# Patient Record
Sex: Female | Born: 1955 | Race: White | Hispanic: No | Marital: Married | Smoking: Never smoker
Health system: Southern US, Community
[De-identification: ages and names within clinical notes are randomized; demographics above are authoritative.]

## PROBLEM LIST (undated history)

## (undated) DIAGNOSIS — K279 Peptic ulcer, site unspecified, unspecified as acute or chronic, without hemorrhage or perforation: Secondary | ICD-10-CM

## (undated) DIAGNOSIS — I10 Essential (primary) hypertension: Secondary | ICD-10-CM

## (undated) DIAGNOSIS — I498 Other specified cardiac arrhythmias: Secondary | ICD-10-CM

## (undated) DIAGNOSIS — G90A Postural orthostatic tachycardia syndrome (POTS): Secondary | ICD-10-CM

## (undated) DIAGNOSIS — Z8719 Personal history of other diseases of the digestive system: Secondary | ICD-10-CM

## (undated) DIAGNOSIS — L409 Psoriasis, unspecified: Secondary | ICD-10-CM

## (undated) DIAGNOSIS — H3530 Unspecified macular degeneration: Secondary | ICD-10-CM

## (undated) DIAGNOSIS — K7460 Unspecified cirrhosis of liver: Secondary | ICD-10-CM

## (undated) DIAGNOSIS — R002 Palpitations: Secondary | ICD-10-CM

## (undated) DIAGNOSIS — I951 Orthostatic hypotension: Secondary | ICD-10-CM

## (undated) DIAGNOSIS — M069 Rheumatoid arthritis, unspecified: Secondary | ICD-10-CM

## (undated) DIAGNOSIS — D509 Iron deficiency anemia, unspecified: Secondary | ICD-10-CM

## (undated) DIAGNOSIS — E119 Type 2 diabetes mellitus without complications: Secondary | ICD-10-CM

## (undated) DIAGNOSIS — J45909 Unspecified asthma, uncomplicated: Secondary | ICD-10-CM

## (undated) DIAGNOSIS — E039 Hypothyroidism, unspecified: Secondary | ICD-10-CM

## (undated) DIAGNOSIS — R000 Tachycardia, unspecified: Secondary | ICD-10-CM

## (undated) DIAGNOSIS — K219 Gastro-esophageal reflux disease without esophagitis: Secondary | ICD-10-CM

## (undated) HISTORY — DX: Iron deficiency anemia, unspecified: D50.9

## (undated) HISTORY — DX: Unspecified asthma, uncomplicated: J45.909

## (undated) HISTORY — DX: Tachycardia, unspecified: R00.0

## (undated) HISTORY — PX: CHOLECYSTECTOMY: SHX55

## (undated) HISTORY — DX: Peptic ulcer, site unspecified, unspecified as acute or chronic, without hemorrhage or perforation: K27.9

## (undated) HISTORY — DX: Type 2 diabetes mellitus without complications: E11.9

## (undated) HISTORY — DX: Palpitations: R00.2

## (undated) HISTORY — PX: ABDOMINAL HYSTERECTOMY: SHX81

## (undated) HISTORY — PX: KNEE ARTHROSCOPY: SUR90

## (undated) HISTORY — PX: ABDOMINAL HERNIA REPAIR: SHX539

## (undated) HISTORY — PX: TRIGGER FINGER RELEASE: SHX641

## (undated) HISTORY — DX: Essential (primary) hypertension: I10

## (undated) HISTORY — DX: Personal history of other diseases of the digestive system: Z87.19

## (undated) HISTORY — DX: Rheumatoid arthritis, unspecified: M06.9

## (undated) HISTORY — DX: Hypothyroidism, unspecified: E03.9

## (undated) HISTORY — DX: Orthostatic hypotension: I95.1

## (undated) HISTORY — DX: Other specified cardiac arrhythmias: I49.8

## (undated) HISTORY — DX: Postural orthostatic tachycardia syndrome (POTS): G90.A

## (undated) HISTORY — PX: CATARACT EXTRACTION, BILATERAL: SHX1313

## (undated) HISTORY — DX: Psoriasis, unspecified: L40.9

---
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
## 2000-10-23 ENCOUNTER — Encounter: Admission: RE | Admit: 2000-10-23 | Discharge: 2000-10-23 | Payer: Self-pay | Admitting: Internal Medicine

## 2000-10-23 ENCOUNTER — Encounter: Payer: Self-pay | Admitting: Internal Medicine

## 2002-11-19 ENCOUNTER — Encounter: Payer: Self-pay | Admitting: Urology

## 2002-11-19 ENCOUNTER — Ambulatory Visit (HOSPITAL_COMMUNITY): Admission: RE | Admit: 2002-11-19 | Discharge: 2002-11-19 | Payer: Self-pay | Admitting: *Deleted

## 2003-07-26 HISTORY — PX: TILT TABLE STUDY: SHX6124

## 2004-05-04 ENCOUNTER — Ambulatory Visit (HOSPITAL_COMMUNITY): Admission: RE | Admit: 2004-05-04 | Discharge: 2004-05-04 | Payer: Self-pay | Admitting: Internal Medicine

## 2004-05-26 ENCOUNTER — Ambulatory Visit: Payer: Self-pay | Admitting: Cardiology

## 2004-11-16 ENCOUNTER — Ambulatory Visit: Payer: Self-pay | Admitting: Psychiatry

## 2006-04-13 ENCOUNTER — Ambulatory Visit (HOSPITAL_COMMUNITY): Payer: Self-pay | Admitting: Psychiatry

## 2006-04-14 ENCOUNTER — Ambulatory Visit (HOSPITAL_COMMUNITY): Payer: Self-pay | Admitting: Psychology

## 2006-04-20 ENCOUNTER — Ambulatory Visit (HOSPITAL_COMMUNITY): Payer: Self-pay | Admitting: Psychiatry

## 2006-05-04 ENCOUNTER — Ambulatory Visit (HOSPITAL_COMMUNITY): Payer: Self-pay | Admitting: Psychiatry

## 2006-07-04 ENCOUNTER — Ambulatory Visit (HOSPITAL_COMMUNITY): Payer: Self-pay | Admitting: Psychiatry

## 2006-07-06 ENCOUNTER — Ambulatory Visit (HOSPITAL_COMMUNITY): Payer: Self-pay | Admitting: Psychology

## 2006-08-01 ENCOUNTER — Ambulatory Visit (HOSPITAL_COMMUNITY): Payer: Self-pay | Admitting: Psychiatry

## 2006-08-07 ENCOUNTER — Ambulatory Visit (HOSPITAL_COMMUNITY): Payer: Self-pay | Admitting: Psychology

## 2006-10-03 ENCOUNTER — Ambulatory Visit (HOSPITAL_COMMUNITY): Payer: Self-pay | Admitting: Psychiatry

## 2006-12-07 ENCOUNTER — Ambulatory Visit (HOSPITAL_COMMUNITY): Payer: Self-pay | Admitting: Psychiatry

## 2006-12-20 ENCOUNTER — Ambulatory Visit (HOSPITAL_COMMUNITY): Payer: Self-pay | Admitting: Psychology

## 2007-02-16 ENCOUNTER — Ambulatory Visit (HOSPITAL_COMMUNITY): Payer: Self-pay | Admitting: Psychology

## 2007-02-22 ENCOUNTER — Ambulatory Visit (HOSPITAL_COMMUNITY): Payer: Self-pay | Admitting: Psychiatry

## 2007-03-20 ENCOUNTER — Ambulatory Visit (HOSPITAL_COMMUNITY): Payer: Self-pay | Admitting: Psychology

## 2007-04-19 ENCOUNTER — Ambulatory Visit (HOSPITAL_COMMUNITY): Payer: Self-pay | Admitting: Psychology

## 2007-04-26 ENCOUNTER — Ambulatory Visit (HOSPITAL_COMMUNITY): Payer: Self-pay | Admitting: Psychiatry

## 2007-12-12 ENCOUNTER — Ambulatory Visit (HOSPITAL_COMMUNITY): Admission: RE | Admit: 2007-12-12 | Discharge: 2007-12-12 | Payer: Self-pay | Admitting: Urology

## 2007-12-12 IMAGING — CT CT ABDOMEN W/O CM
1 of 2 series · 15 of 32 positions shown, 19 images · non-contrast
Comparison: None

CT ABDOMEN

CLINICAL DATA: Left flank pain and hematuria, history kidney
stones

CT ABDOMEN AND PELVIS WITHOUT CONTRAST
TECHNIQUE: Multidetector CT imaging of the abdomen and pelvis was
performed following the standard
protocol without intravenous contrast. Sagittal and coronal images
reconstructed from axial data set.

[Series 2: stone 5.0 b40f · axial · 0.80mm/px · z∈[-518,-58]mm · 15 of 100 slices shown, 19 images]
[im 4/100  soft-tissue]
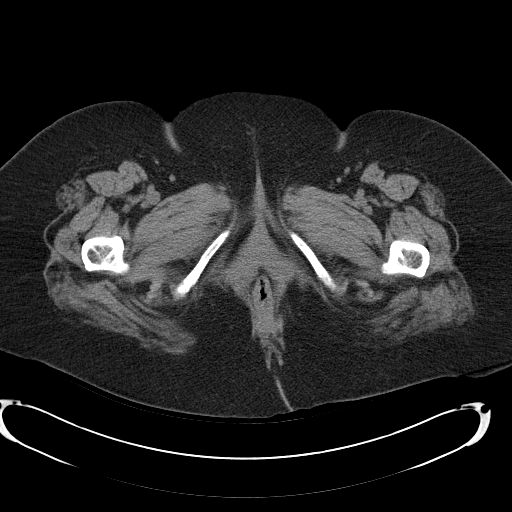
[im 4/100  bone]
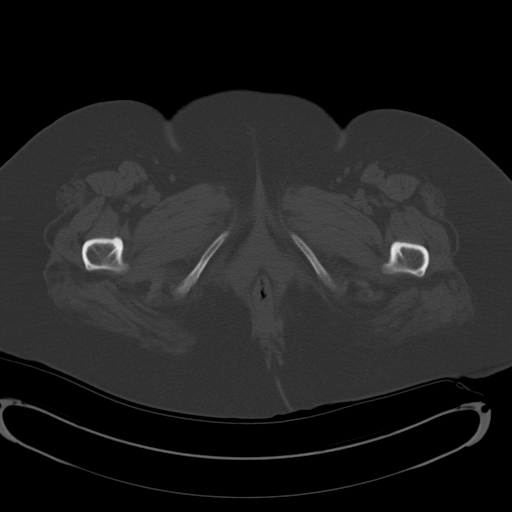
[im 12/100  soft-tissue]
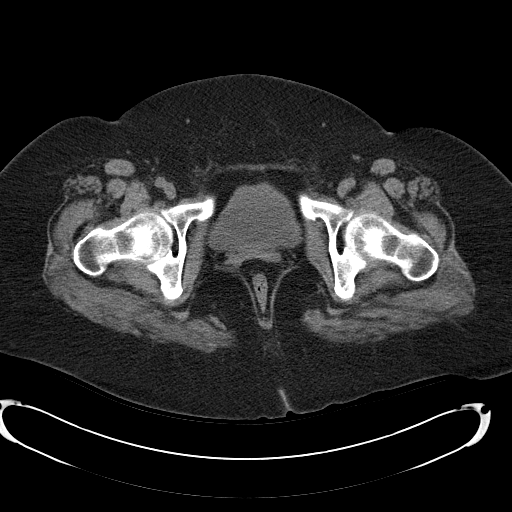
[im 20/100  soft-tissue]
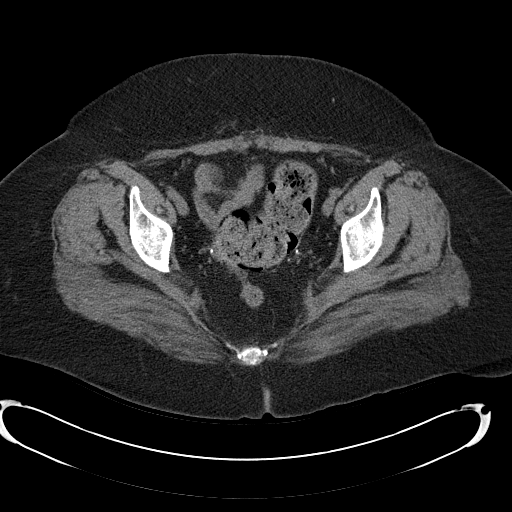
[im 28/100  soft-tissue]
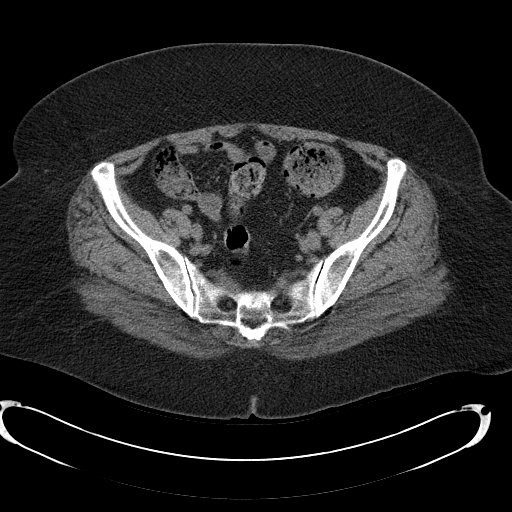
[im 36/100  soft-tissue]
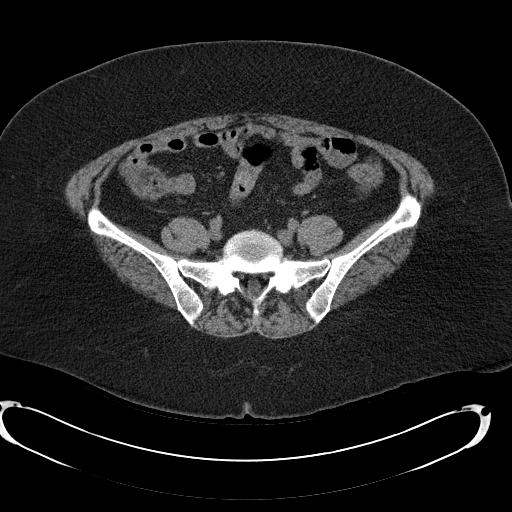
[im 44/100  soft-tissue]
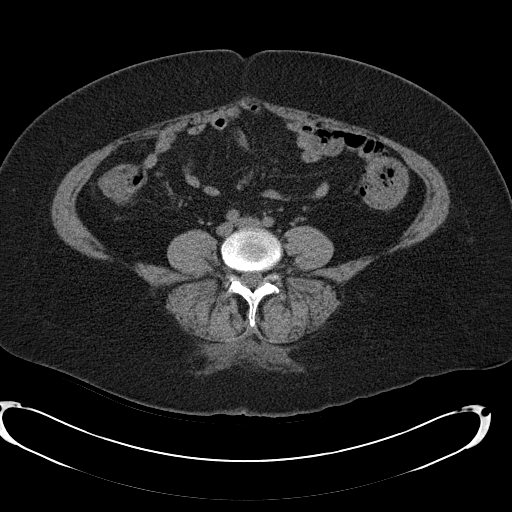
[im 52/100  soft-tissue]
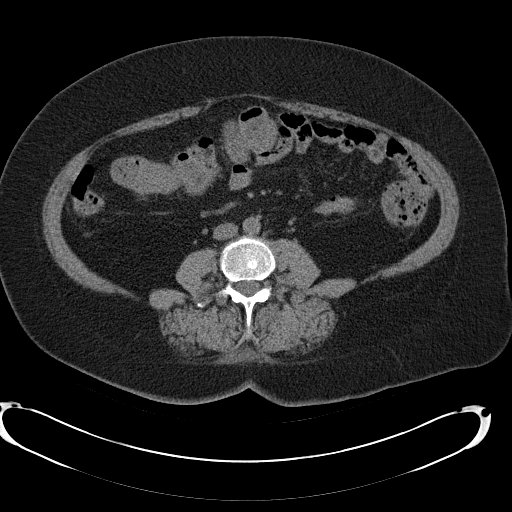
[im 56/100  soft-tissue]
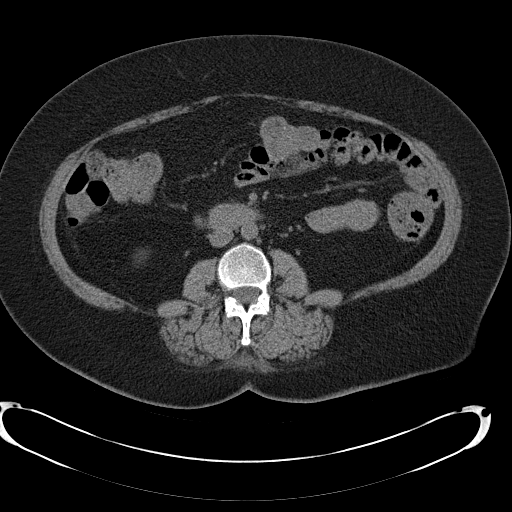
[im 64/100  soft-tissue]
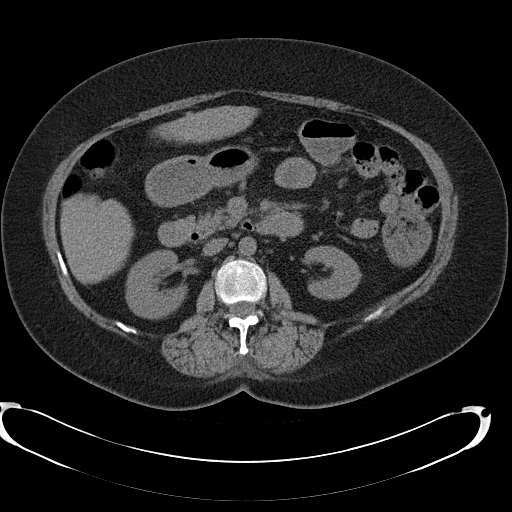
[im 64/100  bone]
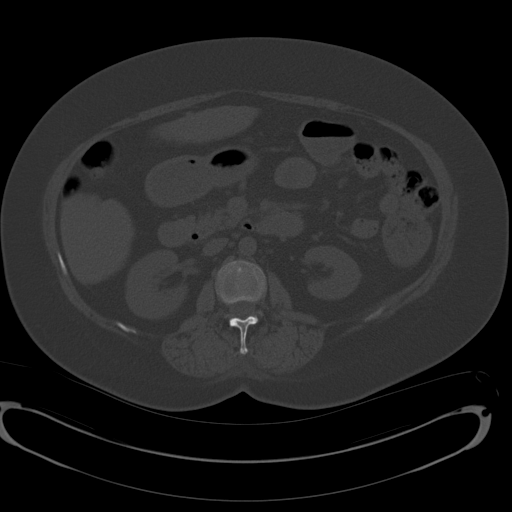
[im 72/100  soft-tissue]
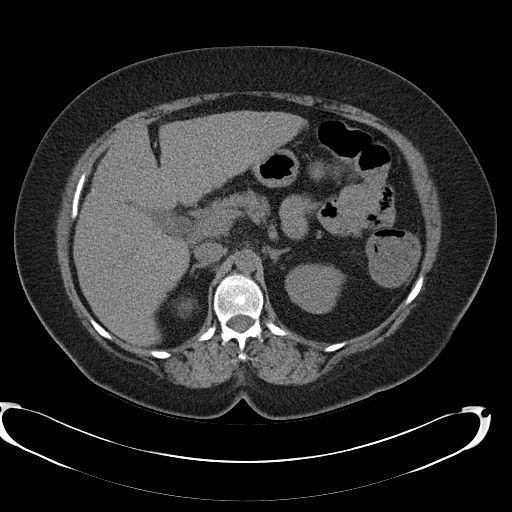
[im 80/100  soft-tissue]
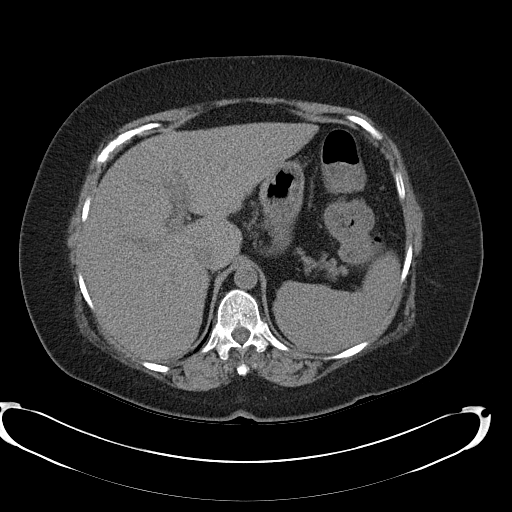
[im 84/100  lung]
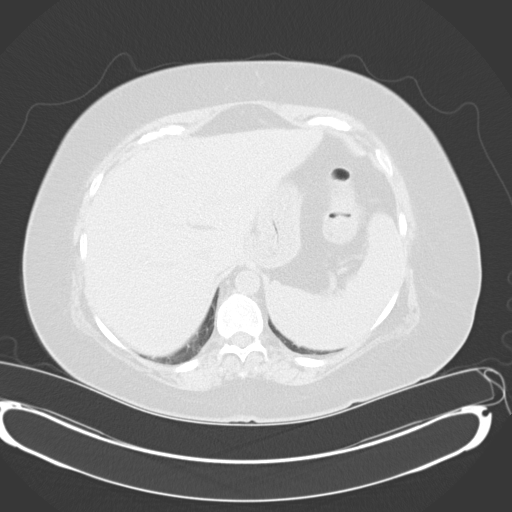
[im 88/100  soft-tissue]
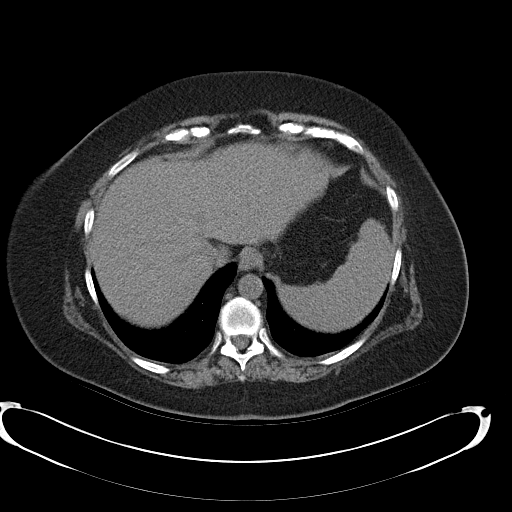
[im 88/100  lung]
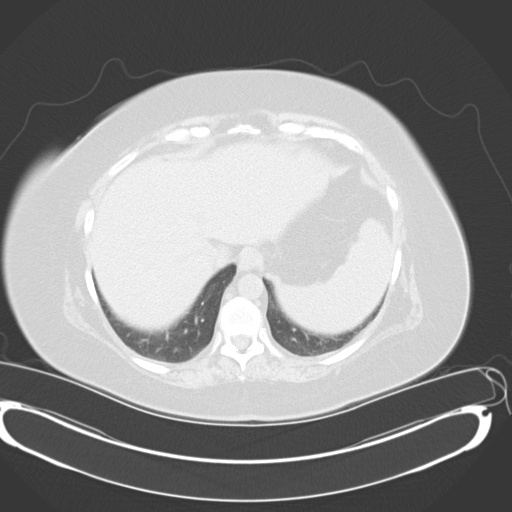
[im 92/100  lung]
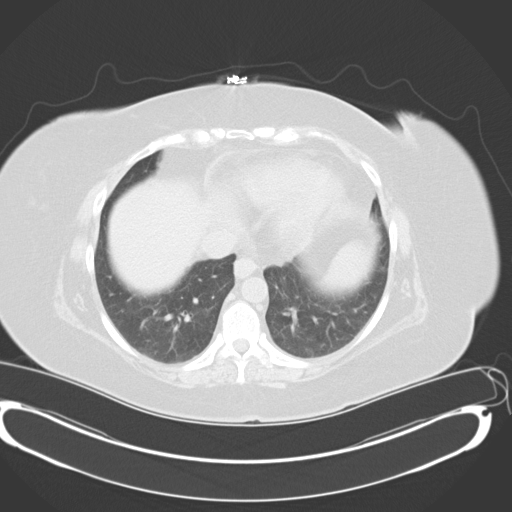
[im 96/100  soft-tissue]
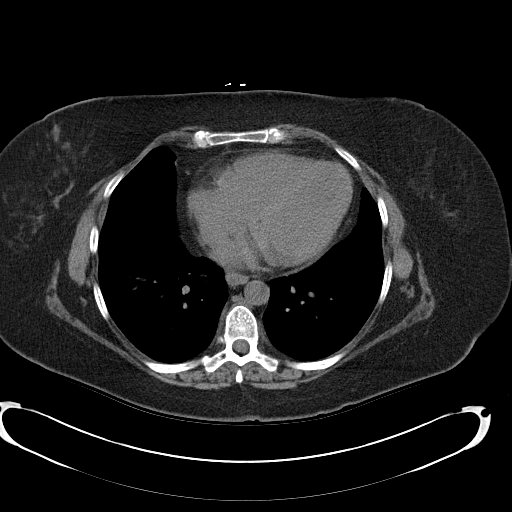
[im 96/100  lung]
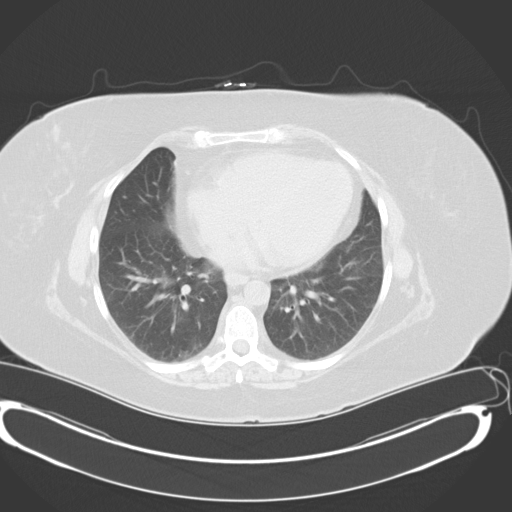

[15 of 32 positions shown; findings below may reference images not displayed]

FINDINGS: Lung bases clear.
No urinary tract calcification or dilatation.
11 mm diameter low attenuation focus left lobe liver, too small to
characterize, image 24.
Low attenuation focus medial right mid kidney, 16 x 15 mm image 35.
Remaining solid organs and bowel loops in upper abdomen normal for
a noncontrast study.
No mass, adenopathy, or free fluid.
IMPRESSION: Question tiny hepatic and right renal cysts, recommend sonographic
follow-up to exclude solid lesions.
No other upper abdominal abnormalities.

CT PELVIS
FINDINGS: Large and small bowel loops in pelvis normal.
Bladder decompressed.
Distal ureters unremarkable.
Status post hysterectomy.
No pelvic mass, adenopathy, free fluid, or inflammatory process.
Mildly prominent stool throughout colon.
No acute bony abnormalities.
IMPRESSION: No acute pelvic abnormalities.

## 2008-03-18 ENCOUNTER — Ambulatory Visit: Payer: Self-pay | Admitting: Cardiology

## 2008-03-24 ENCOUNTER — Ambulatory Visit: Payer: Self-pay | Admitting: Cardiology

## 2008-04-02 ENCOUNTER — Ambulatory Visit: Payer: Self-pay | Admitting: Cardiology

## 2008-04-15 ENCOUNTER — Ambulatory Visit: Payer: Self-pay | Admitting: Cardiology

## 2009-08-28 ENCOUNTER — Ambulatory Visit (HOSPITAL_COMMUNITY): Payer: Self-pay | Admitting: Psychology

## 2009-11-12 ENCOUNTER — Ambulatory Visit (HOSPITAL_COMMUNITY): Payer: Self-pay | Admitting: Psychology

## 2010-12-07 NOTE — Assessment & Plan Note (Signed)
[facility]                          [facility] OFFICE NOTE   NAME:Mewborn, HUDSYN BARICH                        MRN:          [id number]  DATE:03/18/2008                            DOB:          [birth year]    PRIMARY CARE PHYSICIAN:  Doreen Beam, MD   REASON FOR VISIT:  Scheduled followup.   HISTORY OF PRESENT ILLNESS:  Ms. Weill has not been seen in the office  since 2005.  She has a history of fluctuating blood pressures and  possibly postural orthostatic tachycardia with palpitations.  She did  have a tilt table test under the direction of Dr. Graciela Husbands back in 2005,  which did confirm some degree of orthostatic intolerance manifested by  postural tachycardia.  Toprol-XL was prescribed and has been continued  since that time.  She is back in the office stating that she has noticed  some fluctuations in her blood pressures when she checks them at home  and has also had some recent palpitations.  Regarding her blood  pressure, she actually self adjusts her Toprol-XL alternating between  100 mg and 150 mg depending on her blood pressure and heart rate  response.  Based on her description, it seems that she is on the 100 mg  dose most regularly.  She feels like the 150 mg dose tends to lower her  blood pressure too much.  Her description of palpitations is that she  experiences a fairly sudden onset palpitation with a feeling of vertigo  and sometimes chest discomfort, only in the evening when she is supine,  and associated with a feeling of lightheadedness.  This also seems to  abate fairly quickly.  She reports blood work per Dr. Sherril Croon back in July,  which I was able to review demonstrating progressive anemia with a  hemoglobin of 8.4, MCV of 73, abnormal TSH of 15.9, normal chemistries  today with the exception of glucose 112, and an LDL of 145.  She is on  levothyroxine and has been placed on iron supplementation as well.  She  actually feels as if her  palpitations have improved from every other  day, occurrence to perhaps once in a 2-week period of time.  Otherwise  as far as exertion, she does have dyspnea on exertion, which has been  somewhat more noticeable.  Her last ultrasound in 2003 demonstrated  normal ventricle systolic function with a small pericardial effusion,  trace tricuspid regurgitation, and no significant mitral regurgitation.   ALLERGIES:  AMOXICILLIN, AMPICILLIN, and NONSTEROIDAL ANTIINFLAMMATORY  drugs.   PRESENT MEDICATIONS:  1. Toprol-XL 150 mg to 100 mg, adjusted by the patient on a daily      basis depending on blood pressure.  2. Slow iron daily.  3. Levothyroxine 75 mcg daily.  4. Zanaflex 4 mg p.o. b.i.d. to t.i.d.  5. Zyrtec 10 mg p.o. daily p.r.n.  6. Ventolin p.r.n.   REVIEW OF SYSTEMS:  As described in history of present illness.  She has  had no syncope.  No reproducible exertional chest pain.  No orthopnea or  PND.  She reports  that she drinks at least 8 glasses of water a day,  also urinates frequently.  No fevers or chills, cough, hemoptysis,  otherwise negative.   PHYSICAL EXAMINATION:  VITAL SIGNS:  Blood pressure is 160/94, heart  rate is 99, settles into the high 80s after being seated for several  minutes, weight 118.6 pounds.  She is an overweight woman seated in no  acute distress.  HEENT:  Conjunctiva is normal.  Oropharynx is clear.  NECK:  Supple.  No elevated jugular venous pressure.  No loud bruits or  no thyromegaly is noted.  No thyroid tenderness.  LUNGS:  Clear without labored breathing.  CARDIAC:  Reveals regular rate and rhythm.  No obvious pathologic  murmur.  There is a soft systolic murmur at the base.  No S3, gallop, or  pericardial rub.  ABDOMEN:  Obese.  No tenderness noted.  Bowel sounds present.  EXTREMITIES:  Exhibit no frank pitting edema.  Distal pulses are 1+.  SKIN:  Warm and dry.  MUSCULOSKELETAL:  No kyphosis noted.  NEUROPSYCHIATRIC:  The patient is alert  and oriented x3.  Affect is  appropriate.   A 12-lead electrocardiogram showed sinus rhythm at 99 beats per minute  with nonspecific ST-T wave changes.  No marked change in comparison to  the previous tracing from 2003 with the exception of increase in heart  rate from 73-99.   IMPRESSION AND RECOMMENDATIONS:  1. History of fluctuating blood pressures with previously documented      mild orthostasis and postural orthostatic tachycardia.  She has      been on long-term beta-blockers since evaluation by Dr. Graciela Husbands in      the past.  She seems to be indicating problems tolerating her      Toprol-XL 150 mg as it results in relatively low blood pressures      and a feeling of fatigue.  I have asked her to decrease this back      to 100 mg daily and check her blood pressure over the next 2 weeks      with a followup with nursing.  Based on her trend in heart rate and      blood pressure, we can determine the next step and medical therapy.      It might be that we leave her on beta-blocker therapy at the      present dose and that something else for blood pressure control is      needed.  She also understands to maintain a reasonably well-      hydrated state.  We will plan a followup echocardiogram as well to      ensure continued to reassess left ventricular systolic function and      exclude any valvular problems.  2. Regarding her palpitations, these seemed to be improving.  She has      never had any clearly documented supraventricular dysrhythmias.      She has worn a cardiac monitor in the past.  If the symptoms      progress or worsen, we can certainly reconsider followup monitor.      She was comfortable with observation for the time being.  3. Chronic anemia based on records and recent labs.  She has a      microcytic anemia.  She      tells me that she has had normal stool cards and a previous      Gastroenterology evaluation.  Dr. Sherril Croon has been following this  most       recently.     Jonelle Sidle, MD  Electronically Signed    SGM/MedQ  DD: 03/18/2008  DT: 03/19/2008  Job #: [id number]   cc:   Doreen Beam, MD

## 2010-12-07 NOTE — Assessment & Plan Note (Signed)
[facility]                          [facility] OFFICE NOTE   NAME:Kristine Rocha, Kristine Rocha                        MRN:          [id number]  DATE:04/15/2008                            DOB:          09/13/1955    CARDIOLOGIST:  Jonelle Sidle, MD.   PRIMARY CARE PHYSICIAN:  Doreen Beam, MD.   REASON FOR VISIT:  A 11-month followup.   HISTORY OF PRESENT ILLNESS:  Kristine Rocha is a 55 year old female patient  with a history of fluctuating blood pressures and potential postural  orthostatic tachycardia with palpitations, who has previously undergone  evaluation by Dr. Graciela Husbands with tilt table testing in 2005.  She saw Dr.  Diona Browner for the first time in 4 years on March 18, 2008, in followup.  She had problems with blood pressure fluctuations and palpitations.  She  recently was diagnosed with hypothyroidism and was placed on thyroid  replacement.  She was also noted to have a hemoglobin of 8.4 and MCV of  73 and, in looking through her records, it appears as though she has  chronic microcytic anemia.  She denies any history of GI bleeding.  She  has been worked up by Gastroenterology in the past.  Dr. Diona Browner asked  her to change her Toprol from 150 mg to 100 mg daily and to keep track  of her blood pressures.  She brought a list of her blood pressures in  recently.  She averaged a blood pressure of less than 140/90.  However,  she was asked to start on low-dose amlodipine at 2.5 mg daily.  She took  2 dosages of this and noted significant side effects.  She felt near-  syncopal.  She actually states she felt like she was going to die.  She stopped the medication and her symptoms resolved.  She has continued  to note some palpitations from time to time.  They are much improved  since she started on thyroid replacement.  She also had a followup  hemoglobin after starting back on iron therapy.  This was apparently in  the 11 range recently.  She denies  significant shortness of breath or  chest pain.  She denies any syncope.   CURRENT MEDICATIONS:  Slow iron, Levothyroxine 0.275 mg daily, Zanaflex  4 mg b.i.d. to t.i.d., Zyrtec, Ventolin, Toprol-XL 100 mg daily.   PHYSICAL EXAMINATION:  GENERAL:  She is a well-nourished and well-  developed female, in no distress.  VITAL SIGNS:  Blood pressure is 130/74, pulse 64, and weight 227 pounds.  HEENT:  Normal.  NECK:  Without JVD.  CARDIAC:  S1 and S2.  Regular rate and rhythm.  LUNGS:  Clear to auscultation bilaterally.  ABDOMEN:  Soft and nontender.  EXTREMITIES:  With trace edema bilaterally.  VASCULAR:  Without carotid bruits bilaterally.   Echocardiogram performed on March 24, 2008, revealed an EF of 60-65%,  trace MR, and mild TR.   ASSESSMENT AND PLAN:  1. Hypertension with a history of orthostatic intolerance in the past      with associated tachycardia.  The patient has actually  been      maintaining acceptable blood pressures over the last several weeks      while on Toprol 100 mg daily.  She was unable to tolerate Norvasc      and I think, she probably became significantly hypotensive with      this medication.  Again, in looking through her blood pressures,      she averaged less than 140/90.  Her blood pressure today is      acceptable.  Given her history of postural orthostatic intolerance,      I would not suggest placing her on a diuretic.  At this point, I do      not think she needs further blood pressure medications.  We will      continue to observe her blood pressures over time and make      adjustments in the future as necessary.  2. Palpitations.  The patient has had palpitations in the past without      documented supraventricular arrhythmias.  She apparently had a      monitor placed some years ago without any significant findings.      She has noted palpitations recently over the last several months.      They are different from what she has had in the past.   She is a      retired Engineer, civil (consulting).  She used to work at [employer].  She did document      premature atrial contractions at one point, but her symptoms      currently do not remind her for premature atrial contractions.      Also, she does not think her symptoms remind her of her previous      tachycardia.  We had a long discussion today regarding possibly      proceeding with event monitoring.  She would like to hold off on      this for now.  She feels that her symptoms have improved      significantly since she was placed on thyroid replacement and her      hemoglobin has improved.  I think this is a reasonable approach and      we will bring her back in 6 months for review of her symptoms.  We      will make a decision at that time regarding whether or not she      should have an event monitor placed.  She knows to come back sooner      if she has further changes in her symptoms.  3. Microcytic anemia.  This seems to be a chronic problem and she has      seen Gastroenterology in the past.  She will continue to follow up      with Dr. Sherril Croon.  4. Hypothyroidism.  This is a new diagnosis and she is on thyroid      replacement.  She notes that she had recent blood work that      revealed a TSH of 4.68.  She will continue follow up with Dr. Sherril Croon.   DISPOSITION:  The patient will be brought back in followup in the next 6  months with Dr. Diona Browner or sooner p.r.n.     Tereso Newcomer, PA-C  Electronically Signed      Jonelle Sidle, MD  Electronically Signed   SW/MedQ  DD: 04/15/2008  DT: 04/16/2008  Job #: [id number]   cc:   Doreen Beam, MD

## 2010-12-10 NOTE — H&P (Signed)
NAMESANDREA, BOER                 ACCOUNT NO.:  [id number]   MEDICAL RECORD NO.:  [id number]          PATIENT TYPE:  OIB   LOCATION:  2899                         FACILITY:  [facility]   PHYSICIAN:  Duke Salvia, M.D.  DATE OF BIRTH:  30-Dec-1955   DATE OF ADMISSION:  05/04/2004  DATE OF DISCHARGE:                                HISTORY & PHYSICAL   CARDIOLOGIST:  Dr. Lewayne Bunting and Dr. Jonelle Sidle.   ELECTROPHYSIOLOGIST:  Dr. Duke Salvia.   PRIMARY CARE PHYSICIAN:  Dr. Doreen Beam.   ALLERGIES:  __________, AMPICILLIN, AMOXICILLIN, and ALL NSAIDS.   HISTORY OF PRESENT ILLNESS:  Ms. Rocha is a 55 year old female with a  complex symptomatology. Basically, there are two competing cardiac issues.  1.  Tachyarrhythmias caught on the monitor in July or August 2005. This is a      sinus tachycardia with rates and axis of 160 beats per minute. Episodes      can be brief (1.5 to 8+) or last for days. At least every other day she      has one of these episodes. The last one yesterday which lasted one-half      day. These episodes currently are better on Toprol XL 50 mg daily. She      was on Toprol 50 mg b.i.d. and was hypotensive with that. She does not      particularly get dizzy, but is somewhat short of breath and some times      has chest tightness with these tachyarrhythmias.  2.  Presyncope. This seems to happen in hot weather and with dehydration.      The patient has collapsed in the past, that was one year ago, but lately      her symptoms seem to be a roaring and pressure around her head. She      cannot see and she cannot hear. Her last spells were about 9 days ago.      She had four on that day and two the next. They last only seconds. After      the episodes she feels fatigue and chest tightness. Thromboembolic risk      factors are hypertension and a family history of myocardial infarction      in her father. It is not known her Cardiolite status or echocardiogram      status. She claims she has had an echocardiogram in the past.   MEDICATIONS:  1.  Toprol XL 50 mg daily.  2.  Zanaflex 4 mg b.i.d.  3.  Belegrags one tablet daily.  4.  Vivelle 0.1 mg per day.  5.  Transdermal patch to change twice weekly.   SOCIAL HISTORY:  The patient lives in [place] with her husband. She has two  children. One has juvenile diabetes. No tobacco, ethanol, and no drugs.   FAMILY HISTORY:  Mother is alive and well. Father is died at age 48 of a  myocardial infarction. One sister has WPW syndrome, status post ablation.  One brother is alive and well.   REVIEW OF SYSTEMS:  The patient is not complaining of fevers, chills, night  sweats, weight change, or adenopathy. HEENT: She does have frequent  epistaxis especially when the weather is dry. She does experience vertigo.  No photophobia, vision, or hearing loss, except she has vision and hearing  loss when she has these episodes as described in history of present illness.  SKIN: No nonhealing ulcerations, lesions, or rash. CARDIOPULMONARY: Chest  tightness and shortness of breath with rapid heart rate and also with these  presyncopal spells. She has orthopnea, sleeps on two pillows. No paroxysmal  nocturnal dyspnea and no edema. She has a history of presyncope as described  in the history of present illness and tachy palpitations lasting from one-  half day to several days. UROGENITAL: No specific complaints.  NEUROLOGIC/PSYCHIATRIC: She has weakness with these palpitations. She has  been treated in the past with depression with both Lexapro and Wellbutrin,  currently not on these. GI: She has a history of GI bleed with the last one  being in June 2003 with anemia. She was admitted with a hemoglobin of 8 and  treated with two units of packed red blood cells. ENDOCRINE: Thyroid status  not discussed with the patient and is unknown. ARTHRALGIAS: The patient has  back pain on a chronic basis and pain at the right second  toe which is a  gouty arthritis.   PHYSICAL EXAMINATION:  VITAL SIGNS: Pending.  GENERAL: Alert and oriented female in no acute distress.  HEENT: Normocephalic and atraumatic. Eyes--pupils equal, round, and reactive  to light. Extraocular movements intact. Sclera clear. Nares--without  discharge.  NECK: Supple with no carotid bruits auscultated. The patient does not wear  dentures. No jugular venous distention or cervical lymphadenopathy.  HEART: Regular rate and rhythm with S1 and S2. No murmurs, rubs, or gallops.  LUNGS: Clear to auscultation and percussion bilaterally.  ABDOMEN: Soft, mildly obese, nontender, bowel sounds present. No rebound or  guarding. Abdominal aorta is nonpulsatile.  EXTREMITIES: No evidence of clubbing, cyanosis, or edema. There are no  rashes or ulcerations.  MUSCULOSKELETAL: No joint deformity, effusion. No kyphosis or scoliosis.  NEUROLOGIC: Alert and oriented times three with no focal deficits noted.   There is no chest x-ray for reference. Electrocardiogram shows a sinus  tachycardia with rate of 136, PR interval is 120, QRS 76, and QTC is 509.   LABORATORY DATA:  None prior to this admission.   PROBLEM LIST:  1.  Admitted with history of presyncope with possible psychogenic component      for tilt table test.  2.  History of tachy palpitations with recording of sinus tachycardia on      Holter monitor. Toprol XL 50 mg daily has helped with the frequency and      duration of these symptoms.  3.  History of anemia secondary to GI bleed. Most recent admission in June      2005 with transfusion of two units of packed red blood cells.  4.  Hypertension.  5.  History of  peptic ulcer disease.  6.  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  7.  Status post cholecystectomy three years ago.  8.  Post menopausal on Vivelle and Zanaflex.  9.  Fatty liver on liver biopsy and also demonstrating hepatitis C. 10. Dyslipidemia, unable to take statins secondary to liver  disease.   PLAN:  Tilt table with Dr. Graciela Husbands on October 11th.       GM/MEDQ  D:  05/04/2004  T:  05/04/2004  Job:  [id number]

## 2010-12-10 NOTE — Op Note (Signed)
NAMEJOSSILYN, Kristine Rocha                 ACCOUNT NO.:  [id number]   MEDICAL RECORD NO.:  [id number]          PATIENT TYPE:  OIB   LOCATION:  2899                         FACILITY:  [facility]   PHYSICIAN:  Duke Salvia, M.D.  DATE OF BIRTH:  [birth year]   DATE OF PROCEDURE:  05/04/2004  DATE OF DISCHARGE:                                 OPERATIVE REPORT   PREOPERATIVE DIAGNOSIS:  Tachy palpitations and orthostatic lightheadedness.   POSTOPERATIVE DIAGNOSIS:  Tachy palpitations and orthostatic  lightheadedness.   OPERATION PERFORMED:  Orthostatic head up tilt table testing.   SURGEON:  Duke Salvia, M.D.   DESCRIPTION OF PROCEDURE:  Following the obtaining of informed consent, the  patient was brought to the electrophysiologic laboratory and placed on the  fluoroscopic table in the supine position.  She was equilibrated in this  position with a blood pressure approximately 120/70 with a pulse of 75 to  80.  She was tilted up to 15 degrees and there was an abrupt change in her  blood pressure down to 105 to 110 range.  This gradually corrected itself to  the 115 to 120 range by the end of the study.  Her heart rate, however,  increased from a baseline of about 70 to the high 90s to 100 to equilibrate.   IMPRESSION:  1.  Mild orthostasis.  2.  Postural orthostatic tachycardia.   [name] test confirms some degree of orthostatic intolerance and some  manifestations of postural tachycardia.  She is symptomatically much  improved on her Toprol and we will continue that.  I have, in fact, advised  her to increase her Toprol today from 50 to 100 mg a day.  We will see her  again as needed.  She will follow up with Dr. Diona Browner and Suszanne Conners. Julious Oka. in [place].       SCK/MEDQ  D:  05/04/2004  T:  05/04/2004  Job:  [id number]   cc:   Doreen Beam  [address]  [place]  [place] [address]  Fax: [phone]   Electrophysiology lab

## 2011-06-18 ENCOUNTER — Other Ambulatory Visit (HOSPITAL_COMMUNITY): Payer: Self-pay | Admitting: Psychiatry

## 2011-06-21 DIAGNOSIS — L409 Psoriasis, unspecified: Secondary | ICD-10-CM | POA: Insufficient documentation

## 2011-06-21 DIAGNOSIS — Z79899 Other long term (current) drug therapy: Secondary | ICD-10-CM | POA: Insufficient documentation

## 2012-02-07 DIAGNOSIS — L219 Seborrheic dermatitis, unspecified: Secondary | ICD-10-CM | POA: Insufficient documentation

## 2013-06-05 DIAGNOSIS — L304 Erythema intertrigo: Secondary | ICD-10-CM | POA: Insufficient documentation

## 2014-10-28 ENCOUNTER — Encounter: Payer: Self-pay | Admitting: Cardiology

## 2014-10-29 ENCOUNTER — Encounter: Payer: Self-pay | Admitting: Cardiology

## 2014-11-19 ENCOUNTER — Encounter: Payer: Self-pay | Admitting: *Deleted

## 2014-11-20 ENCOUNTER — Encounter: Payer: Self-pay | Admitting: Cardiology

## 2014-11-20 ENCOUNTER — Ambulatory Visit (INDEPENDENT_AMBULATORY_CARE_PROVIDER_SITE_OTHER): Payer: Medicare Other | Admitting: Cardiology

## 2014-11-20 ENCOUNTER — Encounter: Payer: Self-pay | Admitting: *Deleted

## 2014-11-20 VITALS — BP 102/78 | HR 73 | Ht 61.0 in | Wt 211.8 lb

## 2014-11-20 DIAGNOSIS — R0602 Shortness of breath: Secondary | ICD-10-CM

## 2014-11-20 DIAGNOSIS — R072 Precordial pain: Secondary | ICD-10-CM

## 2014-11-20 DIAGNOSIS — R011 Cardiac murmur, unspecified: Secondary | ICD-10-CM

## 2014-11-20 DIAGNOSIS — R000 Tachycardia, unspecified: Secondary | ICD-10-CM | POA: Diagnosis not present

## 2014-11-20 DIAGNOSIS — I498 Other specified cardiac arrhythmias: Secondary | ICD-10-CM

## 2014-11-20 DIAGNOSIS — I10 Essential (primary) hypertension: Secondary | ICD-10-CM

## 2014-11-20 DIAGNOSIS — I951 Orthostatic hypotension: Secondary | ICD-10-CM

## 2014-11-20 DIAGNOSIS — G90A Postural orthostatic tachycardia syndrome (POTS): Secondary | ICD-10-CM

## 2014-11-20 NOTE — Patient Instructions (Signed)
Your physician recommends that you continue on your current medications as directed. Please refer to the Current Medication list given to you today. Your physician has requested that you have an echocardiogram. Echocardiography is a painless test that uses sound waves to create images of your heart. It provides your doctor with information about the size and shape of your heart and how well your heart's chambers and valves are working. This procedure takes approximately one hour. There are no restrictions for this procedure. Your physician has requested that you have a lexiscan myoview. For further information please visit [email or web address]. Please follow instruction sheet, as given. We will call you with your results.

## 2014-11-20 NOTE — Progress Notes (Signed)
Cardiology Office Note  Date: 11/20/2014   ID: Kristine Rocha, DOB 16-Mar-1956, MRN [id number]  PCP: Glenda Chroman., MD  Primary Cardiologist: Rozann Lesches, MD   Chief Complaint  Patient presents with  . Hospitalization Follow-up  . Chest Pain    History of Present Illness: Kristine Rocha is a 59 y.o. female last seen in 2009, referred for cardiology evaluation by Dr. Woody Seller after recent hospital stay at [facility]. She was observed for evaluation following presentation with chest pain. Cardiac markers were normal arguing against ACS, ECG and CT angiography of the chest are outlined below without acute abnormalities. Review of the discharge summary indicates that there was concern about a gastrointestinal etiology.  She comes in today for evaluation, describes her symptoms as a feeling of heaviness and breathlessness that occurred at night time waking her up, had followed a situation where she was angry. The symptoms recurred and became progressive prompting her hospital stay. She has had only one subsequent event similar to this following discharge, that again occurred when she was upset. Over the last few months she has reported less stamina and increased dyspnea on exertion. She does not have a documented history of obstructive CAD although has not had any recent ischemic evaluation.  Family history includes premature cardiovascular disease. Additional health problems are noted below, she is on methotrexate with history of rheumatoid arthritis and psoriasis. Her most recent lab work is outlined below.  She functions in her ADLs although is limited by bilateral arthritic pain in her knees. 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   Past Medical History  Diagnosis Date  . Essential hypertension   . Microcytic anemia   . Palpitations   . Hypothyroidism   . Postural orthostatic tachycardia syndrome     Mildly orthostatic results tilt table test 2005  - Dr. Caryl Comes  . Peptic ulcer disease     . History of GI bleed   . Asthma   . Psoriasis   . Rheumatoid arthritis     Past Surgical History  Procedure Laterality Date  . Tilt table study  2005    [place]  . Cholecystectomy    . Abdominal hysterectomy  20 years ago   . Abdominal hernia repair  15 years ago    Current Outpatient Prescriptions  Medication Sig Dispense Refill  . aspirin 325 MG tablet Take 325 mg by mouth as needed.    . gabapentin (NEURONTIN) 400 MG capsule Take 400 mg by mouth at bedtime.    . hyoscyamine (LEVSIN, ANASPAZ) 0.125 MG tablet Take 0.125 mg by mouth as needed.    Marland Kitchen levothyroxine (SYNTHROID, LEVOTHROID) 75 MCG tablet Take 75 mcg by mouth daily before breakfast.    . methotrexate 2.5 MG tablet Takes 3 tabs (7.5mg ) by mouth every Sunday.    . metoprolol succinate (TOPROL-XL) 100 MG 24 hr tablet Take 100 mg by mouth daily. Take with or immediately following a meal.    . tiZANidine (ZANAFLEX) 4 MG capsule Take 4 mg by mouth 3 (three) times daily as needed for muscle spasms.     No current facility-administered medications for this visit.    Allergies:  Amoxicillin; Ampicillin; and Other   Social History: The patient  reports that she has never smoked. She does not have any smokeless tobacco history on file. She reports that she does not drink alcohol or use illicit drugs.   Family History: The patient's family history includes Alzheimer's disease in her mother; Arrhythmia  in her sister; Heart attack in her brother, father, and maternal grandfather; Pneumonia in her mother.   ROS:  Please see the history of present illness. Otherwise, complete review of systems is positive for improved psoriasis, mild arthralgias with the exception of more significant pain in her knees.  All other systems are reviewed and negative.   Physical Exam: VS:  BP 102/78 mmHg  Pulse 73  Ht 5\' 1"  (1.549 m)  Wt 211 lb 12.8 oz (96.072 kg)  BMI 40.04 kg/m2  SpO2 99%, BMI Body mass index is 40.04 kg/(m^2).  Wt Readings from  Last 3 Encounters:  11/20/14 211 lb 12.8 oz (96.072 kg)     General: Overweight woman, appears comfortable at rest. HEENT: Conjunctiva and lids normal, oropharynx clear. Neck: Supple, no elevated JVP or carotid bruits, no thyromegaly. Lungs: Clear to auscultation, nonlabored breathing at rest. Cardiac: Regular rate and rhythm, no S3, 3-7/6 systolic murmur, no pericardial rub. Abdomen: Soft, nontender, bowel sounds present, no guarding or rebound. Extremities: No pitting edema, distal pulses 2+. Skin: Warm and dry. Few psoriatic lesions. Musculoskeletal: No kyphosis. Neuropsychiatric: Alert and oriented x3, affect grossly appropriate.   ECG: Tracing from 10/28/2014 at [facility] showed sinus rhythm with borderline short PR interval, increased voltage, and diffuse nonspecific ST segment abnormalities.   Recent Labwork:  10/28/2014: Potassium 4.4, BUN 10, creatinine 0.9, TSH 0.48, troponin I less than 0.01, INR 1.0, hemoglobin 11.6, platelets 226  10/20/2014: C-reactive protein 16.3, hemoglobin 11.6, platelets 229, nightly 11, creatinine 1.0, potassium 5.3, AST 17, ALT 15  Other Studies Reviewed Today:  Chest CT angiogram 10/28/2014 [facility]) reported no evidence of pulmonary embolus, no aortic dissection, patchy atelectasis without edema or consolidation, atherosclerotic calcification within the right common carotid, hepatic steatosis.   Assessment and Plan:  1. Recent episodic chest pain as outlined as well as perception of decreased stamina and shortness of breath over the last few months. Records reviewed including recent documentation of normal cardiac markers, nonspecific ST segment abnormalities by ECG. She has a family history of premature cardiovascular disease, personal cardiac risk factors including hypertension and also some documentation of atherosclerosis by CT imaging. Plan will be to proceed with a Lexiscan Cardiolite for objective ischemic evaluation.  2. Cardiac murmur,  echocardiogram will be obtained to assess valvular status.  3. History of postural orthostatic tachycardia, treated with long-term beta blocker. This seems to be relatively stable.  4. Essential hypertension, blood pressure is stable.  Current medicines were reviewed with the patient today.   Orders Placed This Encounter  Procedures  . NM Myocar Multi W/Spect W/Wall Motion / EF  . Myocardial Perfusion Imaging  . Echocardiogram    Disposition: Call with results.   Signed, Satira Sark, MD, [facility] 11/20/2014 2:27 PM    [place] [facility] [address], [place], [place] [address] Phone: [phone]; Fax: [phone]

## 2014-11-28 ENCOUNTER — Encounter (HOSPITAL_COMMUNITY): Payer: Medicare Other

## 2014-11-28 ENCOUNTER — Inpatient Hospital Stay (HOSPITAL_COMMUNITY): Admission: RE | Admit: 2014-11-28 | Payer: Medicare Other | Source: Ambulatory Visit

## 2014-11-28 ENCOUNTER — Ambulatory Visit (HOSPITAL_COMMUNITY): Payer: Medicare Other

## 2014-11-28 ENCOUNTER — Other Ambulatory Visit (HOSPITAL_COMMUNITY): Payer: Medicare Other

## 2014-12-05 ENCOUNTER — Telehealth: Payer: Self-pay | Admitting: Cardiology

## 2014-12-05 NOTE — Telephone Encounter (Signed)
Noted  

## 2014-12-05 NOTE — Telephone Encounter (Signed)
Mrs. Hoganson called stating that she needs to cancel her stress testing and echo for Monday due to problems with her husband's health. She is willing to do the echo in the office but for now wants to cancel the stress test. Please advise.

## 2014-12-08 ENCOUNTER — Encounter (HOSPITAL_COMMUNITY): Payer: Medicare Other

## 2014-12-08 ENCOUNTER — Inpatient Hospital Stay (HOSPITAL_COMMUNITY): Admission: RE | Admit: 2014-12-08 | Payer: Medicare Other | Source: Ambulatory Visit

## 2014-12-08 ENCOUNTER — Other Ambulatory Visit (HOSPITAL_COMMUNITY): Payer: Medicare Other

## 2014-12-08 ENCOUNTER — Encounter (HOSPITAL_COMMUNITY): Admission: RE | Admit: 2014-12-08 | Payer: Medicare Other | Source: Ambulatory Visit

## 2014-12-09 ENCOUNTER — Other Ambulatory Visit: Payer: Self-pay | Admitting: Cardiology

## 2014-12-09 DIAGNOSIS — R011 Cardiac murmur, unspecified: Secondary | ICD-10-CM

## 2014-12-11 ENCOUNTER — Ambulatory Visit (INDEPENDENT_AMBULATORY_CARE_PROVIDER_SITE_OTHER): Payer: Medicare Other

## 2014-12-11 ENCOUNTER — Other Ambulatory Visit: Payer: Self-pay

## 2014-12-11 DIAGNOSIS — R011 Cardiac murmur, unspecified: Secondary | ICD-10-CM | POA: Diagnosis not present

## 2014-12-16 ENCOUNTER — Telehealth: Payer: Self-pay | Admitting: *Deleted

## 2014-12-16 NOTE — Telephone Encounter (Signed)
-----   Message from Satira Sark, MD sent at 12/16/2014  9:03 AM EDT ----- Reviewed report. Patient has vigorous LV systolic function with only mild diastolic dysfunction and mild LVOT gradient. Not entirely clear that these findings represent any major cause for symptoms however. Mild mitral regurgitation and normal pulmonary pressure also noted. Chart indicates that patient canceled original plans for stress testing. Would check and see if she is able to proceed with this now.

## 2014-12-16 NOTE — Telephone Encounter (Signed)
Patient informed. Patient said that she does plan to do the stress test but can't do it right now because of the serious situation she is dealing with concerning her husband. Patient said she would be calling back later in the summer to reschedule her stress test when she gets her husband straightened out.

## 2017-01-18 ENCOUNTER — Ambulatory Visit (INDEPENDENT_AMBULATORY_CARE_PROVIDER_SITE_OTHER): Payer: Medicare Other | Admitting: Orthopaedic Surgery

## 2017-01-18 ENCOUNTER — Encounter (INDEPENDENT_AMBULATORY_CARE_PROVIDER_SITE_OTHER): Payer: Self-pay | Admitting: Orthopaedic Surgery

## 2017-01-18 VITALS — BP 131/79 | HR 91 | Ht 64.0 in | Wt 200.0 lb

## 2017-01-18 DIAGNOSIS — M65331 Trigger finger, right middle finger: Secondary | ICD-10-CM

## 2017-01-18 NOTE — Progress Notes (Signed)
Office Visit Note   Patient: Kristine Rocha           Date of Birth: [birth year]           MRN: [id number] Visit Date: 01/18/2017              Requested by: Glenda Chroman, MD [place], [place] [address] PCP: Glenda Chroman, MD   Assessment & Plan: Visit Diagnoses:  1. Trigger finger, right middle finger     Plan: Long discussion regarding the diagnosis. Kristine Rocha would like to proceed with surgical release  Follow-Up Instructions: Return will schedule surgery.   Orders:  No orders of the defined types were placed in this encounter.  No orders of the defined types were placed in this encounter.     Procedures: No procedures performed   Clinical Data: No additional findings.   Subjective: Chief Complaint  Patient presents with  . Right Hand - Pain, Numbness    Kristine Rocha is a 61 y o that was referred from Dr. Lynann Bologna for Right Long trigger finger.hx of  Cortisone injections, not working anymore. Numbness, tingling and into the palm of her hand.  Kristine Rocha is had a problem with triggering of the right long finger for well over a year. She's had at least "3 injections" with minimal relief of her pain on a long-term basis. She feels like she's losing grip and dropping "things". She is a type II diabetic and has history of rheumatoid and psoriatic arthritis. She saw Dr Lynann Bologna yesterday who suggested surgery. She's here today for that evaluation. She denies any numbness or tingling  HPI  Review of Systems   Objective: Vital Signs: BP 131/79   Pulse 91   Ht 5\' 4"  (1.626 m)   Wt 200 lb (90.7 kg)   BMI 34.33 kg/m   Physical Exam  Ortho Exam no obvious rheumatoid changes of her right hand. Painful nodule at along the A1 pulley fully of the right long finger. She can actively trigger that finger. Neurovascular exam intact. Skin intact.  Specialty Comments:  No specialty comments available.  Imaging: No results found.   PMFS History: There are no active  problems to display for this patient.  Past Medical History:  Diagnosis Date  . Asthma   . Essential hypertension   . History of GI bleed   . Hypothyroidism   . Microcytic anemia   . Palpitations   . Peptic ulcer disease   . Postural orthostatic tachycardia syndrome    Mildly orthostatic results tilt table test 2005  - Dr. Caryl Comes  . Psoriasis   . Rheumatoid arthritis ([place])     Family History  Problem Relation Age of Onset  . Heart attack Father        Age 14  . Arrhythmia Sister        WPW s/p ablation- corrected at [facility]  . Alzheimer's disease Mother   . Pneumonia Mother        Aspiration  . Heart attack Brother   . Heart attack Maternal Grandfather     Past Surgical History:  Procedure Laterality Date  . ABDOMINAL HERNIA REPAIR  15 years ago  . ABDOMINAL HYSTERECTOMY  20 years ago   . CHOLECYSTECTOMY    . TILT TABLE STUDY  2005   Caryl Comes   Social History   Occupational History  . Not on file.   Social History Main Topics  . Smoking status: Never Smoker  .  Smokeless tobacco: Never Used  . Alcohol use No  . Drug use: No  . Sexual activity: Not on file     Garald Balding, MD   Note - This record has been created using [employer].  Chart creation errors have been sought, but may not always  have been located. Such creation errors do not reflect on  the standard of medical care.

## 2017-01-18 NOTE — Progress Notes (Deleted)
Office Visit Note   Patient: Kristine Rocha           Date of Birth: 1956/03/01           MRN: [id number] Visit Date: 01/18/2017              Requested by: Glenda Chroman, MD [place], [place] [address] PCP: Glenda Chroman, MD   Assessment & Plan: Visit Diagnoses:  1. Chronic pain of left knee     Plan: ***  Follow-Up Instructions: Return when Euflexxa precerted.   Orders:  No orders of the defined types were placed in this encounter.  No orders of the defined types were placed in this encounter.     Procedures: Large Joint Inj Date/Time: 01/18/2017 12:16 PM Performed by: Garald Balding Authorized by: Garald Balding   Consent Given by:  Patient Timeout: prior to procedure the correct patient, procedure, and site was verified   Indications:  Pain and joint swelling Location:  Knee Site:  L knee Prep: patient was prepped and draped in usual sterile fashion   Needle Size:  25 G Needle Length:  1.5 inches Approach:  Anteromedial Ultrasound Guidance: No   Fluoroscopic Guidance: No   Arthrogram: No   Medications:  5 mL lidocaine 1 %; 80 mg methylPREDNISolone acetate 40 MG/ML; 3 mL bupivacaine 0.5 % Aspiration Attempted: No   Patient tolerance:  Patient tolerated the procedure well with no immediate complications     Clinical Data: No additional findings.   Subjective: Chief Complaint  Patient presents with  . Right Hand - Pain, Numbness    Kristine Rocha is a 61 y o that was referred from Dr. Lynann Bologna for Right Long trigger finger.hx of  Cortisone injections, not working anymore. Numbness, tingling and into the palm of her hand.    HPI  Review of Systems   Objective: Vital Signs: BP 131/79   Pulse 91   Ht 5\' 4"  (1.626 m)   Wt 200 lb (90.7 kg)   BMI 34.33 kg/m   Physical Exam  Ortho Exam  Specialty Comments:  No specialty comments available.  Imaging: No results found.   PMFS History: There are no active problems to display for  this patient.  Past Medical History:  Diagnosis Date  . Asthma   . Essential hypertension   . History of GI bleed   . Hypothyroidism   . Microcytic anemia   . Palpitations   . Peptic ulcer disease   . Postural orthostatic tachycardia syndrome    Mildly orthostatic results tilt table test 2005  - Dr. Caryl Comes  . Psoriasis   . Rheumatoid arthritis ([place])     Family History  Problem Relation Age of Onset  . Heart attack Father        Age 62  . Arrhythmia Sister        WPW s/p ablation- corrected at [facility]  . Alzheimer's disease Mother   . Pneumonia Mother        Aspiration  . Heart attack Brother   . Heart attack Maternal Grandfather     Past Surgical History:  Procedure Laterality Date  . ABDOMINAL HERNIA REPAIR  15 years ago  . ABDOMINAL HYSTERECTOMY  20 years ago   . CHOLECYSTECTOMY    . TILT TABLE STUDY  2005   Caryl Comes   Social History   Occupational History  . Not on file.   Social History Main Topics  . Smoking status: Never  Smoker  . Smokeless tobacco: Never Used  . Alcohol use No  . Drug use: No  . Sexual activity: Not on file     Garald Balding, MD   Note - This record has been created using [employer].  Chart creation errors have been sought, but may not always  have been located. Such creation errors do not reflect on  the standard of medical care.

## 2017-01-24 ENCOUNTER — Encounter (INDEPENDENT_AMBULATORY_CARE_PROVIDER_SITE_OTHER): Payer: Self-pay | Admitting: Orthopaedic Surgery

## 2017-02-09 ENCOUNTER — Encounter: Payer: Self-pay | Admitting: Orthopaedic Surgery

## 2017-02-09 DIAGNOSIS — M65331 Trigger finger, right middle finger: Secondary | ICD-10-CM | POA: Diagnosis not present

## 2017-02-22 ENCOUNTER — Ambulatory Visit (INDEPENDENT_AMBULATORY_CARE_PROVIDER_SITE_OTHER): Payer: Medicare Other | Admitting: Orthopaedic Surgery

## 2017-02-22 DIAGNOSIS — M65331 Trigger finger, right middle finger: Secondary | ICD-10-CM

## 2017-02-22 NOTE — Progress Notes (Signed)
   Post-Op Visit Note   Patient: Kristine Rocha           Date of Birth: 1956/07/13           MRN: [id number] Visit Date: 02/22/2017 PCP: Glenda Chroman, MD   Assessment & Plan:  Chief Complaint: No chief complaint on file.  Visit Diagnoses:  1. Trigger finger, right middle finger   2 weeks status post release of right long trigger finger and doing well.  Plan: Stitches were removed, Steri-Strips applied. No further triggering. Neurovascular exam intact. Still a little sore about the PIP joint but otherwise fine. We'll see back in 1 month with activity as tolerated  Follow-Up Instructions: Return in about 1 month (around 03/25/2017).   Orders:  No orders of the defined types were placed in this encounter.  No orders of the defined types were placed in this encounter.   Imaging: No results found.  PMFS History: There are no active problems to display for this patient.  Past Medical History:  Diagnosis Date  . Asthma   . Essential hypertension   . History of GI bleed   . Hypothyroidism   . Microcytic anemia   . Palpitations   . Peptic ulcer disease   . Postural orthostatic tachycardia syndrome    Mildly orthostatic results tilt table test 2005  - Dr. Caryl Comes  . Psoriasis   . Rheumatoid arthritis ([place])     Family History  Problem Relation Age of Onset  . Heart attack Father        Age 65  . Arrhythmia Sister        WPW s/p ablation- corrected at [facility]  . Alzheimer's disease Mother   . Pneumonia Mother        Aspiration  . Heart attack Brother   . Heart attack Maternal Grandfather     Past Surgical History:  Procedure Laterality Date  . ABDOMINAL HERNIA REPAIR  15 years ago  . ABDOMINAL HYSTERECTOMY  20 years ago   . CHOLECYSTECTOMY    . TILT TABLE STUDY  2005   Caryl Comes   Social History   Occupational History  . Not on file.   Social History Main Topics  . Smoking status: Never Smoker  . Smokeless tobacco: Never Used  . Alcohol use No  . Drug use: No    . Sexual activity: Not on file

## 2017-03-22 ENCOUNTER — Ambulatory Visit (INDEPENDENT_AMBULATORY_CARE_PROVIDER_SITE_OTHER): Payer: Medicare Other | Admitting: Orthopaedic Surgery

## 2017-03-22 ENCOUNTER — Encounter (INDEPENDENT_AMBULATORY_CARE_PROVIDER_SITE_OTHER): Payer: Self-pay | Admitting: Orthopaedic Surgery

## 2017-03-22 VITALS — BP 122/66 | HR 71 | Resp 14 | Ht 60.0 in | Wt 198.0 lb

## 2017-03-22 DIAGNOSIS — M65331 Trigger finger, right middle finger: Secondary | ICD-10-CM

## 2017-03-22 NOTE — Progress Notes (Signed)
Office Visit Note   Patient: Kristine Rocha           Date of Birth: Nov 22, 1955           MRN: [id number] Visit Date: 03/22/2017              Requested by: Glenda Chroman, MD [place], [place] [address] PCP: Glenda Chroman, MD   Assessment & Plan: Visit Diagnoses:  1. Trigger finger, right middle finger     Plan: Return as needed. Doing well postop release of trigger finger 6  Weeks. suggested massaging incision with either aloe or vitamin E  Follow-Up Instructions: Return if symptoms worsen or fail to improve.   Orders:  No orders of the defined types were placed in this encounter.  No orders of the defined types were placed in this encounter.     Procedures: No procedures performed   Clinical Data: No additional findings.   Subjective: Chief Complaint  Patient presents with  . Right Hand - Routine Post Op    [name] is a 61 y o that is 6 weeks status post Right long finger trigger release. She relates she is on methotrexate so healing takes longer. No pain, knuckle is still not "strai".    HPI  Review of Systems  Constitutional: Negative for chills, fatigue and fever.  Eyes: Negative for itching.  Respiratory: Negative for chest tightness and shortness of breath.   Cardiovascular: Negative for chest pain, palpitations and leg swelling.  Gastrointestinal: Negative for blood in stool, constipation and diarrhea.  Musculoskeletal: Negative for back pain, joint swelling, neck pain and neck stiffness.  Neurological: Negative for dizziness, weakness, numbness and headaches.  Hematological: Does not bruise/bleed easily.  Psychiatric/Behavioral: Negative for sleep disturbance. The patient is not nervous/anxious.      Objective: Vital Signs: BP 122/66   Pulse 71   Resp 14   Ht 5' (1.524 m)   Wt 198 lb (89.8 kg)   BMI 38.67 kg/m   Physical Exam  Ortho Exam right long trigger finger incision healing with minimal induration. Full range of motion of  long finger. No swelling. Neurovascular exam intact. No significant tenderness and no "triggering".  Specialty Comments:  No specialty comments available.  Imaging: No results found.   PMFS History: There are no active problems to display for this patient.  Past Medical History:  Diagnosis Date  . Asthma   . Essential hypertension   . History of GI bleed   . Hypothyroidism   . Microcytic anemia   . Palpitations   . Peptic ulcer disease   . Postural orthostatic tachycardia syndrome    Mildly orthostatic results tilt table test 2005  - Dr. Caryl Comes  . Psoriasis   . Rheumatoid arthritis ([place])     Family History  Problem Relation Age of Onset  . Heart attack Father        Age 77  . Arrhythmia Sister        WPW s/p ablation- corrected [facility]  . Alzheimer's disease Mother   . Pneumonia Mother        Aspiration  . Heart attack Brother   . Heart attack Maternal Grandfather     Past Surgical History:  Procedure Laterality Date  . ABDOMINAL HERNIA REPAIR  15 years ago  . ABDOMINAL HYSTERECTOMY  20 years ago   . CHOLECYSTECTOMY    . TILT TABLE STUDY  2005   Caryl Comes   Social History  Occupational History  . Not on file.   Social History Main Topics  . Smoking status: Never Smoker  . Smokeless tobacco: Never Used  . Alcohol use No  . Drug use: No  . Sexual activity: Not on file

## 2018-03-25 HISTORY — PX: COLONOSCOPY WITH ESOPHAGOGASTRODUODENOSCOPY (EGD): SHX5779

## 2018-08-22 ENCOUNTER — Other Ambulatory Visit (HOSPITAL_COMMUNITY): Payer: Self-pay | Admitting: Orthopedic Surgery

## 2018-08-22 DIAGNOSIS — R252 Cramp and spasm: Secondary | ICD-10-CM

## 2018-08-22 DIAGNOSIS — R609 Edema, unspecified: Secondary | ICD-10-CM

## 2018-08-23 ENCOUNTER — Ambulatory Visit (HOSPITAL_COMMUNITY)
Admission: RE | Admit: 2018-08-23 | Discharge: 2018-08-23 | Disposition: A | Discharge status: Home / Self Care | Payer: Medicare Other | Source: Ambulatory Visit | Attending: Orthopedic Surgery | Admitting: Orthopedic Surgery

## 2018-08-23 DIAGNOSIS — R252 Cramp and spasm: Secondary | ICD-10-CM | POA: Diagnosis not present

## 2018-08-23 DIAGNOSIS — R609 Edema, unspecified: Secondary | ICD-10-CM | POA: Diagnosis present

## 2018-08-23 IMAGING — US RIGHT LOWER EXTREMITY VENOUS ULTRASOUND
1 series · 13 of 24 positions shown · non-contrast
Comparison: None.

CLINICAL DATA: Right lower extremity pain and edema for the past
month. Evaluate for DVT.



[Series 1: right lower extremity venous ultrasound · 0.08mm/px · 13 of 48 slices shown]
[im 1/48]
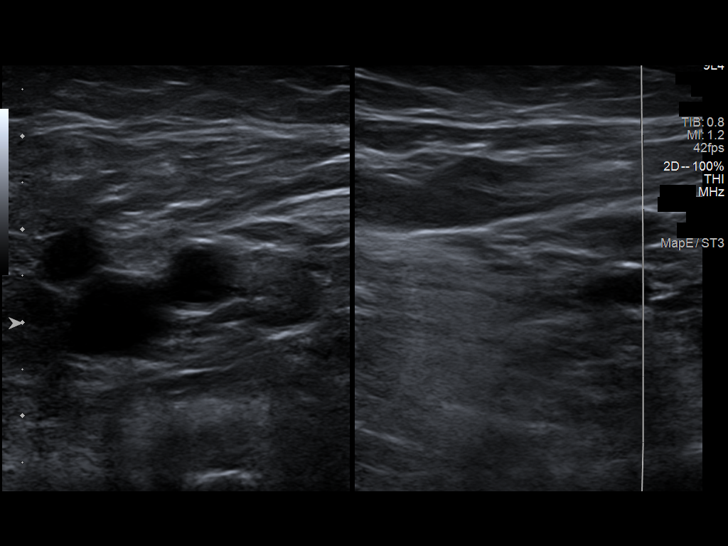
[im 5/48]
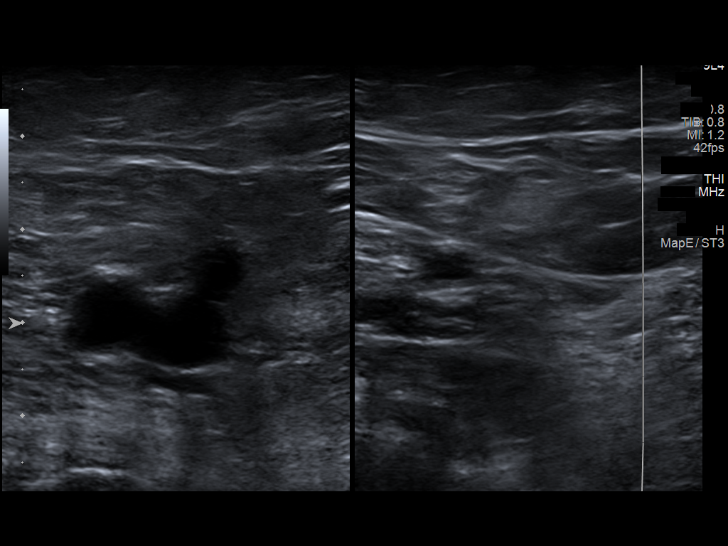
[im 9/48]
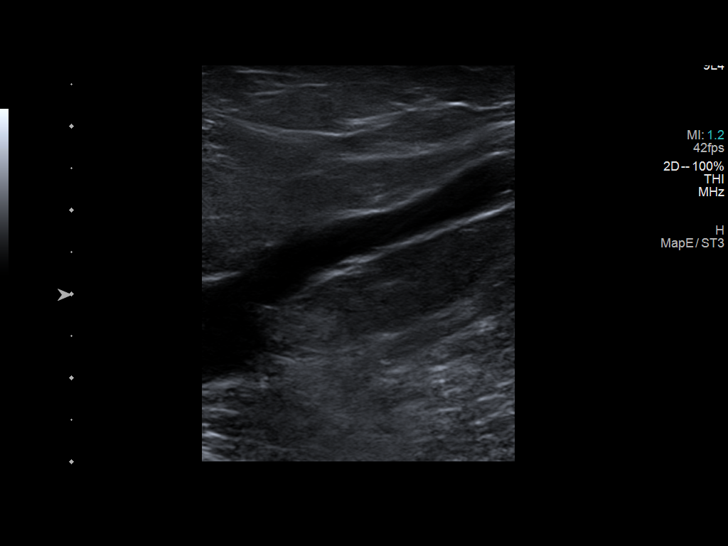
[im 13/48]
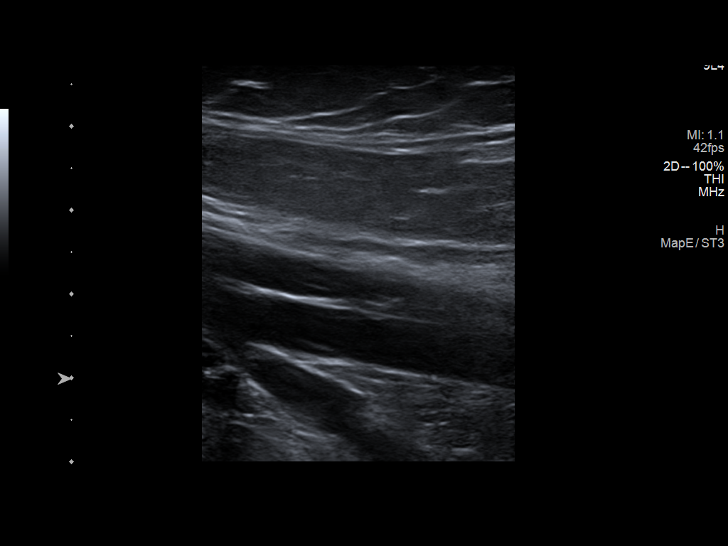
[im 17/48]
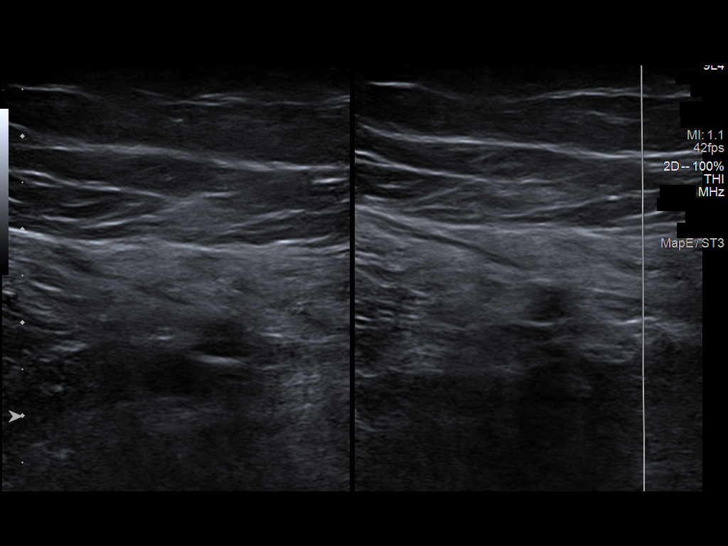
[im 21/48]
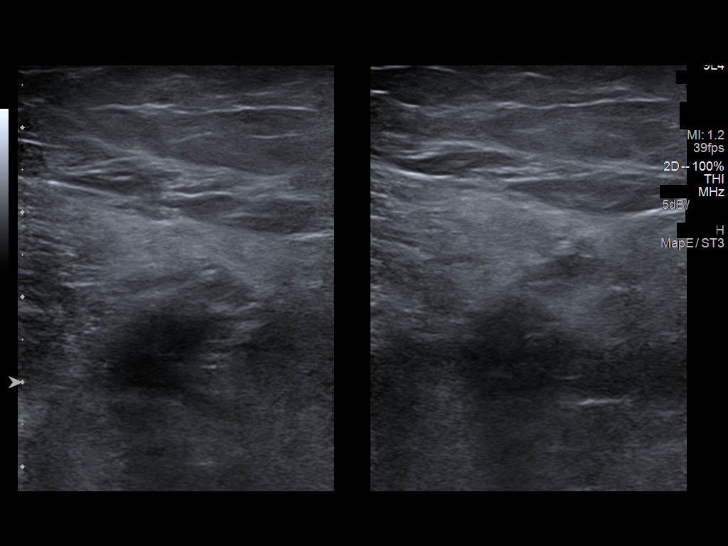
[im 25/48]
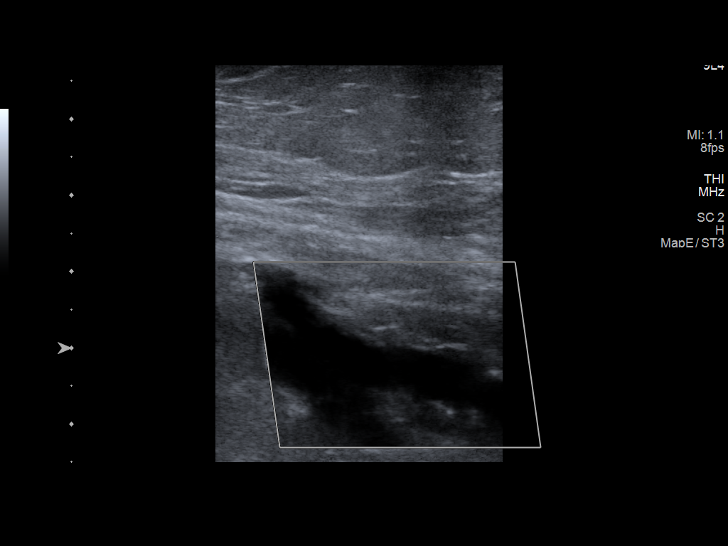
[im 27/48]
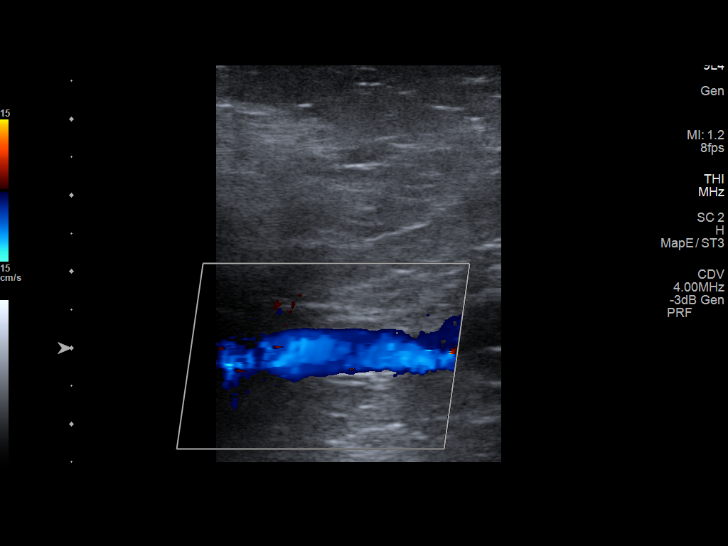
[im 31/48]
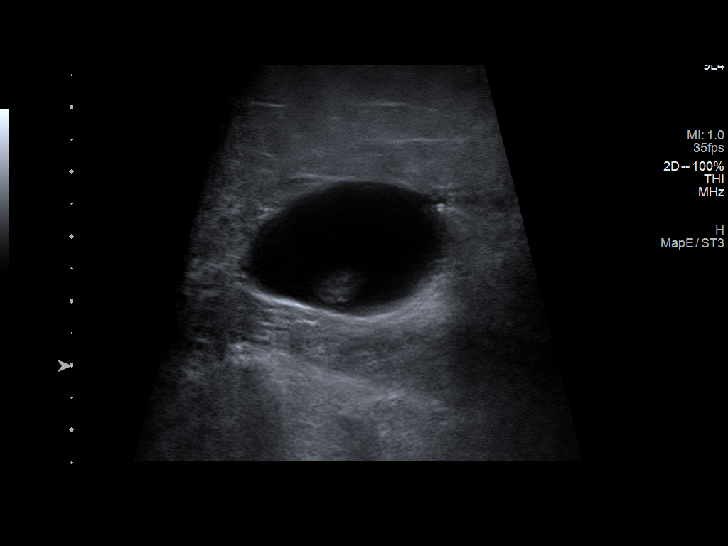
[im 35/48]
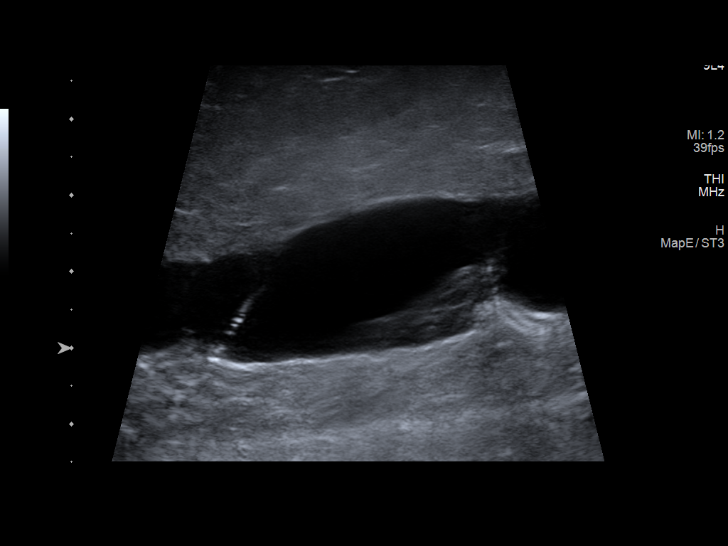
[im 39/48]
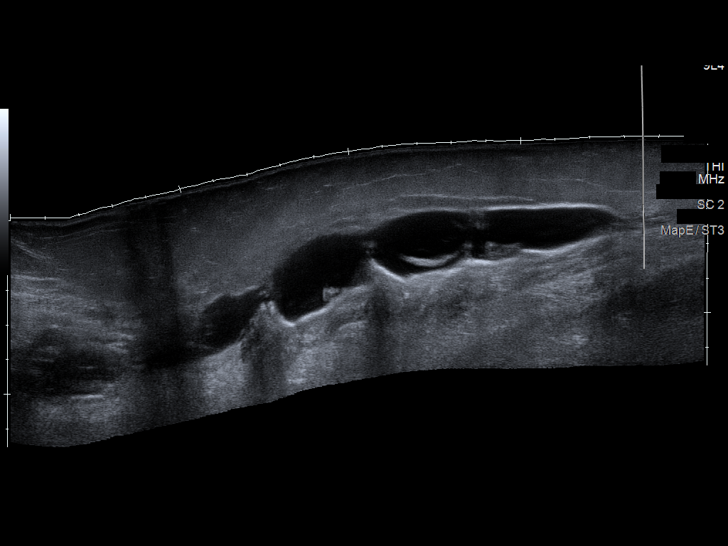
[im 43/48]
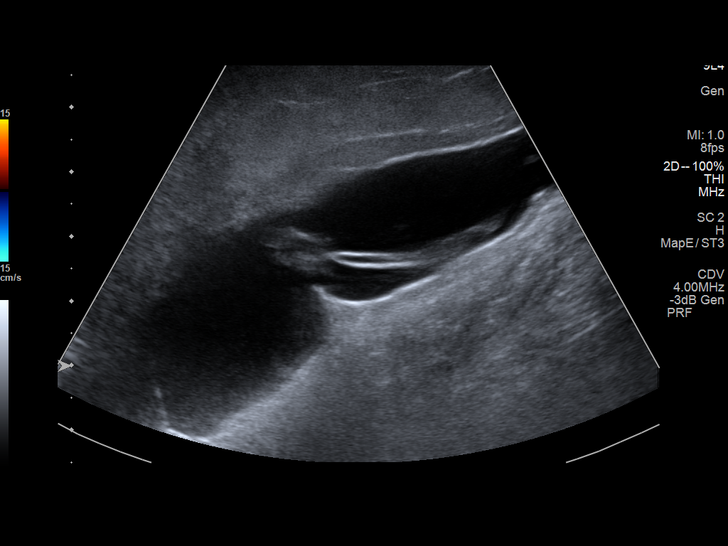
[im 48/48]
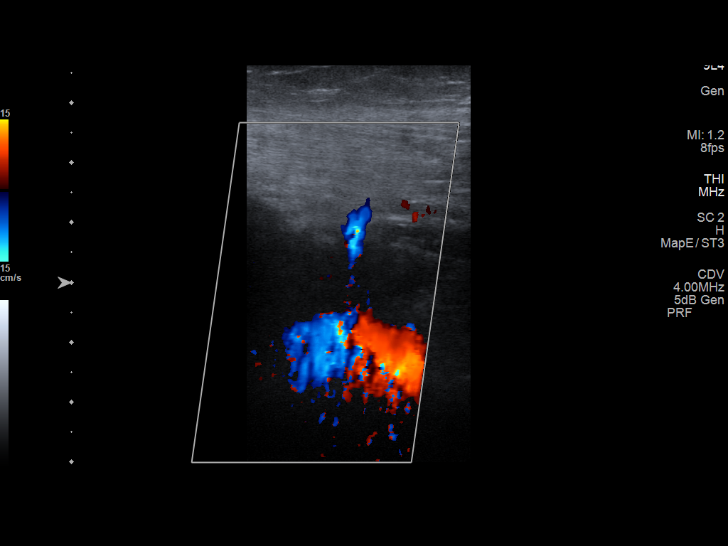

[13 of 24 positions shown; findings below may reference images not displayed]

FINDINGS: Contralateral Common Femoral Vein: Respiratory phasicity is normal
and symmetric with the symptomatic side. No evidence of thrombus.
Normal compressibility.

Common Femoral Vein: No evidence of thrombus. Normal
compressibility, respiratory phasicity and response to augmentation.

Saphenofemoral Junction: No evidence of thrombus. Normal
compressibility and flow on color Doppler imaging.

Profunda Femoral Vein: No evidence of thrombus. Normal
compressibility and flow on color Doppler imaging.

Femoral Vein: No evidence of thrombus. Normal compressibility,
respiratory phasicity and response to augmentation.

Popliteal Vein: No evidence of thrombus. Normal compressibility,
respiratory phasicity and response to augmentation.

Calf Veins: No evidence of thrombus. Normal compressibility and flow
on color Doppler imaging.

Superficial Great Saphenous Vein: No evidence of thrombus. Normal
compressibility.

Venous Reflux:  None.

Other Findings: Note is made of a serpiginous minimally complex
fluid collection extending from the right popliteal fossa to the mid
calf measuring at least 17.5 x 2.2 x 3.1 cm.
IMPRESSION: 1. No evidence of DVT within the right lower extremity.
2. Serpiginous minimally complex fluid collection extending from the
right popliteal fossa to the mid calf measuring least 17.5 cm in
length, nonspecific though favored to represent a Baker cyst with
additional differential considerations include hematoma and (less
likely) seroma and abscess.

## 2019-05-29 ENCOUNTER — Encounter: Payer: Self-pay | Admitting: Internal Medicine

## 2019-06-09 ENCOUNTER — Encounter: Payer: Self-pay | Admitting: Gastroenterology

## 2019-06-09 NOTE — Progress Notes (Signed)
Referring Provider: Glenda Chroman, MD Primary Care Physician:  Glenda Chroman, MD Primary Gastroenterologist:  Dr. Gala Romney  Chief Complaint  Patient presents with  . Anemia    hemoccult was +    HPI:   Kristine Rocha is a 63 y.o. female presenting today at the request of Vyas, Dhruv B, MD for iron deficiency anemia.  Chart review: Patient was seen in consult in September 2019 by Dr. Michael Boston for iron deficiency anemia that had required transfusion.  Reported she had had EGD and TCS with him greater than 10 years prior.  Plans to pursue EGD and TCS for IDA.   Recent labs with PCP on 05/24/2019: CBC: WBC 4.9, hemoglobin 8.8 (L), MCV 79, MCH 23.9 (L), MCHC 30.2 (L), platelets 232 Iron panel: Iron 28, iron saturation 9% (L), TIBC 316, ferritin 11 (L)   Today she states she saw her PCP and had routine hemoccult card which was positive last October. Has not had hemoccult this year. Per patient, hemoglobin was 11 in July 2020, 9 in September 2020, then most recently 8.8 in October 2020. Just started iron in September 2020.  Admits to not taking iron daily as it causes constipation.  Has started eating oatmeal and fruit to help with this. Reports TCS and EGD for IDA in 2019 was negative. Reports she received 2 units of blood last year.   She has not seen any bright red blood per rectum or black stools. Stools are soft and formed. With iron, she has 3-4 BMs a week. Prior, she would have a BM daily to every other day. Reports she has had a hemorrhoid for years. Usually internal. If constipated, it will prolapse. Reports history of fissures. Occurs if she passes a hard stool. Does not have one at this time. Will have associated burning. No sharp knifelike pain. No rectal bleeding with this. Uses witch hazel and hydrocortisone cream as needed. Had bright red blood per rectum many years ago. Reports she had a EGD and TCS and was told she had an ulcer and H. Pylori. Procedures were at [facility]  about 15-20 years ago.   Denies any regular abdominal pain. Reports rare epigastric pain after fried foods or spicy foods. Reports she has heartburn and acid reflux at least once a day. Avoids garlic and onion. No spicy foods. Tries to limit fried foods to maybe once a week. No soda.  In the past, Prevacid caused diarrhea, Protonix and Nexium caused bloating.  Not currently taking anything for reflux.  Admits to dysphagia. Couple times a week. More with meats and breads. Occasional trouble with liquids.  Feels food hangs at her sternal notch. Dysphagia has been present for years.  At first states she does not want an endoscopic dilation of her esophagus.  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  She is most worried about not being sedated enough.  After further discussion, patient would like to have dilation of her esophagus as long as she is well sedated.  Admits to nausea 3-4 times a week. Occasional vomiting. No hematemesis or coffee ground emesis. She is trying to lose weight. Cut out red meats, eating more chicken, and decreased bread intake. No soda in 6 months. Started making the dietary changes around July 2020. States she has lost 30-40 lbs in the last 4-6 months.   Admits to lightheadedness/dizziness related to POTS. She has passed out from this. This has not worsened with anemia. No  fever or chills. Admits to allergies this time of the year with nasal congestion. No sore throat. No shortness of breath or cough.  Denies chest pain.  Admits to palpitations with exertion due to POTS.  Denies shortness of breath at rest.  She does have some shortness of breath with exertion.  No cough.  Past Medical History:  Diagnosis Date  . Asthma   . Diabetes ([place])   . Essential hypertension   . History of GI bleed   . Hypothyroidism   . Microcytic anemia   . Palpitations   . Peptic ulcer disease   . Postural orthostatic tachycardia syndrome    Mildly orthostatic results tilt table test  2005  - Dr. Caryl Comes  . Psoriasis   . Rheumatoid arthritis [facility])     Past Surgical History:  Procedure Laterality Date  . ABDOMINAL HERNIA REPAIR  15 years ago  . ABDOMINAL HYSTERECTOMY  20 years ago   . CHOLECYSTECTOMY    . TILT TABLE STUDY  2005   Caryl Comes  . TRIGGER FINGER RELEASE      Current Outpatient Medications  Medication Sig Dispense Refill  . allopurinol (ZYLOPRIM) 300 MG tablet Take 300 mg by mouth daily.    Marland Kitchen atorvastatin (LIPITOR) 10 MG tablet Take 10 mg by mouth daily.    . cetirizine (ZYRTEC) 10 MG tablet Take 10 mg by mouth daily.    Marland Kitchen gabapentin (NEURONTIN) 400 MG capsule Take 400 mg by mouth at bedtime.    . hyoscyamine (LEVSIN, ANASPAZ) 0.125 MG tablet Take 0.125 mg by mouth as needed.    . Iron, Ferrous Sulfate, 325 (65 Fe) MG TABS Take by mouth daily.    Marland Kitchen levothyroxine (SYNTHROID) 88 MCG tablet Take 88 mcg by mouth daily.    . Melatonin 10 MG TABS Take 1 tablet by mouth at bedtime.    . metFORMIN (GLUCOPHAGE-XR) 500 MG 24 hr tablet Take 500 mg by mouth daily.   0  . metoprolol succinate (TOPROL-XL) 100 MG 24 hr tablet Take 100 mg by mouth daily. Takes daily or every other day depending on BP    . ONETOUCH VERIO test strip   0  . OVER THE COUNTER MEDICATION Beef liver capsule 750mg , takes 3 capsules daily    . tiZANidine (ZANAFLEX) 4 MG capsule Take 4 mg by mouth 3 (three) times daily as needed for muscle spasms.    Marland Kitchen omeprazole (PRILOSEC) 40 MG capsule Take 1 capsule (40 mg total) by mouth daily before breakfast. 90 capsule 3   No current facility-administered medications for this visit.     Allergies as of 06/10/2019 - Review Complete 06/10/2019  Allergen Reaction Noted  . Amoxicillin  11/19/2014  . Ampicillin  11/19/2014  . Other  11/19/2014    Family History  Problem Relation Age of Onset  . Heart attack Father        Age 67  . Arrhythmia Sister        WPW s/p ablation- corrected at [facility]  . Alzheimer's disease Mother   . Pneumonia Mother         Aspiration  . Heart attack Brother   . Heart attack Maternal Grandfather   . Colon cancer Maternal Aunt        diagnosed at 15    Social History   Socioeconomic History  . Marital status: Married    Spouse name: Not on file  . Number of children: Not on file  . Years of education: Not on  file  . Highest education level: Not on file  Occupational History  . Not on file  Social Needs  . Financial resource strain: Not on file  . Food insecurity    Worry: Not on file    Inability: Not on file  . Transportation needs    Medical: Not on file    Non-medical: Not on file  Tobacco Use  . Smoking status: Never Smoker  . Smokeless tobacco: Never Used  Substance and Sexual Activity  . Alcohol use: No    Alcohol/week: 0.0 standard drinks  . Drug use: No  . Sexual activity: Not on file  Lifestyle  . Physical activity    Days per week: Not on file    Minutes per session: Not on file  . Stress: Not on file  Relationships  . Social Herbalist on phone: Not on file    Gets together: Not on file    Attends religious service: Not on file    Active member of club or organization: Not on file    Attends meetings of clubs or organizations: Not on file    Relationship status: Not on file  . Intimate partner violence    Fear of current or ex partner: Not on file    Emotionally abused: Not on file    Physically abused: Not on file    Forced sexual activity: Not on file  Other Topics Concern  . Not on file  Social History Narrative  . Not on file    Review of Systems: Gen: See HPI CV: See HPI Resp: See HPI GI: See HPI GU : Denies urinary burning, urinary frequency, urinary hesitancy, or hematuria MS: Reports rheumatoid arthritis and psoriatic arthritis  Derm: Psoriasis on her hands.  Psych: Denies depression, anxiety Heme: Admits to bruising.   Physical Exam: BP (!) 93/59   Pulse 66   Temp (!) 96.7 F (35.9 C) (Temporal)   Ht 5' 0.5" (1.537 m)   Wt 173 lb 9.6  oz (78.7 kg)   BMI 33.35 kg/m  General:   Alert and oriented. Pleasant and cooperative. Well-nourished and well-developed.  Head:  Normocephalic and atraumatic. Eyes:  Without icterus, sclera clear and conjunctiva pink.  Ears:  Normal auditory acuity. Nose:  No deformity, discharge,  or lesions. Lungs:  Clear to auscultation bilaterally. No wheezes, rales, or rhonchi. No distress.  Heart:  S1, S2 present without murmurs appreciated.  Abdomen:  +BS, soft, non-tender and non-distended. No HSM noted. No guarding or rebound. No masses appreciated.  Rectal:  Deferred  Msk:  Symmetrical without gross deformities. Normal posture. Extremities:  Without edema. Neurologic:  Alert and  oriented x4;  grossly normal neurologically. Skin:  Intact. She does have a quarter size bruise on her right forearm.  Otherwise no significant lesions.  Psych: Normal mood and affect.

## 2019-06-10 ENCOUNTER — Encounter: Payer: Self-pay | Admitting: Gastroenterology

## 2019-06-10 ENCOUNTER — Other Ambulatory Visit: Payer: Self-pay

## 2019-06-10 ENCOUNTER — Ambulatory Visit: Payer: Medicare Other | Admitting: Gastroenterology

## 2019-06-10 VITALS — BP 93/59 | HR 66 | Temp 96.7°F | Ht 60.5 in | Wt 173.6 lb

## 2019-06-10 DIAGNOSIS — R1310 Dysphagia, unspecified: Secondary | ICD-10-CM | POA: Diagnosis not present

## 2019-06-10 DIAGNOSIS — K5900 Constipation, unspecified: Secondary | ICD-10-CM

## 2019-06-10 DIAGNOSIS — D509 Iron deficiency anemia, unspecified: Secondary | ICD-10-CM

## 2019-06-10 DIAGNOSIS — K219 Gastro-esophageal reflux disease without esophagitis: Secondary | ICD-10-CM | POA: Diagnosis not present

## 2019-06-10 MED ORDER — OMEPRAZOLE 40 MG PO CPDR: 40.0000 mg | DELAYED_RELEASE_CAPSULE | Freq: Every day | ORAL | 3 refills | Status: DC

## 2019-06-10 NOTE — Assessment & Plan Note (Addendum)
Uncontrolled.  Currently with reflux symptoms daily.  Also with dysphagia for several years typically to solid foods occurring a couple times a week, occasional epigastric pain after fried foods or spicy foods, nausea 3-4 times a week with occasional vomiting. Not currently on any daily PPI therapy.  Reports Prevacid caused diarrhea, Protonix and Nexium caused bloating. Reports losing 30-40 pounds in the last 4-6 months.  She has made dietary changes including cutting out red meats, decreasing bread intake, and eliminating sodas.  Not sure if this accounts for such significant weight loss. Patient reports history of gastric ulcers with H. pylori 15-20 years ago.  She is seen in consultation today for IDA with recent hemoglobin in October of 8.8, ferritin 11.  She underwent EGD and TCS in 2019 for IDA at [facility].  I do not have these reports.  Per patient, procedures were negative. She denies any bright red blood per rectum, melena, or other significant upper or lower GI symptoms.  With upper GI symptoms in the setting of IDA without overt GI bleeding, I am concerned for an upper GI source vs small bowel source.  Differentials including gastritis, duodenitis, esophagitis, erosions, PUD, H. pylori, AVMs, or malignancy.  I am requesting EGD and TCS reports. Further recommendations to follow. We will likely plan to proceed with EGD +/-dilation with placement of Givens capsule at time of EGD with propofol with Dr. Gala Romney. Doubt need for repeat TCS unless large/high grade polyp was removed.  Propofol due to patient's significant concern about achieving adequate sedation. 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 Also on gabapentin and tizanidine.  Start omeprazole 40 mg daily.  Follow-up after procedures.

## 2019-06-10 NOTE — Assessment & Plan Note (Addendum)
Mild constipation in the setting of iron supplementation.  Stools are still soft and formed but decreased in frequency to 3-4 BMs a week.  She has started eating oatmeal and increased fruit intake to help with constipation.  Denies bright red blood per rectum or melena.  She does have IDA as discussed above.  Add Benefiber or Metamucil daily. Use MiraLAX 1 capful daily.  May decrease frequency if loose stools develop. Follow-up after procedures for IDA as described above.

## 2019-06-10 NOTE — Assessment & Plan Note (Addendum)
Solid food dysphagia times several years.  Also with GERD symptoms daily.  Had historically been on Prevacid which caused diarrhea as well as Protonix and Nexium which caused bloating.  Not currently on a PPI.  Also with history of IDA for which she presents for consult today.  Underwent TCS and EGD in 2019 at [facility] for IDA.  Per patient, these procedures were negative.  Most recent hemoglobin 8.8 on 05/24/2019.  Also with ferritin low at 11 and iron saturation low at 9%.  Iron on the low end of normal at 28.  Suspect dysphagia is likely secondary to uncontrolled GERD with development of esophageal stricture, web, or ring.  I am requesting EGD and TCS reports. Further recommendations to follow. We will likely plan to proceed with EGD +/-dilation with placement of Givens capsule at time of EGD with propofol with Dr. Gala Romney. Doubt need for repeat TCS unless large/high grade polyp was removed.  Propofol due to patient's significant concern about achieving adequate sedation. 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 Also on gabapentin and tizanidine.  Start omeprazole 40 mg daily.  Follow-up after procedures.

## 2019-06-10 NOTE — Assessment & Plan Note (Addendum)
63 year old female with past medical history of PUD and GI bleed which patient reports was for 15-20 years ago with H. pylori noted on EGD pathology, asthma, diabetes, HTN, hypothyroidism, POTS, psoriasis, and rheumatoid arthritis who presents at the request of Dr. Woody Seller for iron deficiency anemia.  Patient was found to be iron deficient with positive Hemoccult card last year and underwent TCS and EGD at [facility].  Patient reports procedures were negative.  Most recent labs on 05/24/2019 with hemoglobin 8.8 with low MCH and MCHC, ferritin low at 11, iron saturation low at 9%, total iron on the lower end of normal at 28.  Per patient, her hemoglobin was 11 in July 2020. Recently started on iron in September 2020. She denies any BRBPR or melena.  She is without any significant lower GI symptoms other than mild constipation in the setting of iron.  Upper GI symptoms include daily GERD symptoms, dysphagia a couple times a week, and nausea 3-4 times a week with occasional vomiting without hematemesis or coffee-ground emesis.  Also reports 30-40 pound weight loss in the last 4-6 months.  At least somewhat intentional with cutting out red meats, decreasing bread intake, and no soda in the last 6 months.  Not currently on a PPI.  Prevacid caused diarrhea and Protonix and Nexium caused bloating.  No first-degree relatives with colon cancer.  Maternal aunt was diagnosed with colon cancer at age 47.  With upper GI symptoms in the setting of IDA without overt GI bleeding, I am concerned for an upper GI source vs small bowel source.  Differentials including gastritis, duodenitis, esophagitis, erosions, PUD, H. pylori, AVMs, or malignancy.   I am requesting EGD and TCS reports. Further recommendations to follow. We will likely plan to proceed with EGD +/-dilation with placement of Givens capsule at time of EGD with propofol with Dr. Gala Romney. Doubt need for repeat TCS unless large/high grade polyp was removed.  Propofol  due to patient's significant concern about achieving adequate sedation. 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  Start omeprazole 40 mg daily.  Follow-up after procedures.    Addendum: Received and reviewed TCS and EGD report from [facility] dated 04/10/18. Indication for EGD and TCS was IDA. Impression with normal EGD and normal TCS.   At this point, will proceed with EGD +/- dilation with propofol with Dr. Gala Romney. Givens Capsule placement at the time of EGD. Patient needs procedure as soon as possible as patients hemoglobin is 8.8 and has been trending down.   Medication adjustments for EGD: Hold iron x 7 days prior to procedure.

## 2019-06-10 NOTE — Patient Instructions (Addendum)
I am requesting your colonoscopy and upper endoscopy reports from [facility].  I will review these and have further recommendations for you in the next couple of days.  For your trouble swallowing, we will definitely plan to pursue an upper endoscopy with possible dilation of your esophagus.  We will likely plan to complete a capsule study of your small bowel.  I am sending in omeprazole 40 mg.  You should take this daily 30 minutes before breakfast.  For your constipation secondary to iron, you may add Benefiber or Metamucil.  These are fiber supplements.    You may also start using MiraLAX 1 capful daily.  You may decrease the frequency of MiraLAX if you develop loose stools.    Aliene Altes, PA-C [facility]

## 2019-06-11 ENCOUNTER — Telehealth: Payer: Self-pay | Admitting: Gastroenterology

## 2019-06-11 NOTE — Telephone Encounter (Signed)
Received and reviewed TCS and EGD report from [facility] dated 04/10/18. Indication for EGD and TCS was IDA. Impression with normal EGD and normal TCS.   At this point, would like to proceed with EGD +/- dilation with propofol with Dr. Gala Romney. Givens Capsule placement at the time of EGD. Patient needs procedure as soon as possible as patients hemoglobin is 8.8 and has been trending down.    Medication adjustments for EGD: Hold iron x 7 days prior to procedure.   DX: IDA, dysphagia

## 2019-06-11 NOTE — Telephone Encounter (Signed)
Please remind patient to avoid all NSAIDs including ibuprofen, Advil, Aleve, and Goody powders.

## 2019-06-12 ENCOUNTER — Other Ambulatory Visit: Payer: Self-pay | Admitting: *Deleted

## 2019-06-12 ENCOUNTER — Encounter: Payer: Self-pay | Admitting: *Deleted

## 2019-06-12 DIAGNOSIS — R1310 Dysphagia, unspecified: Secondary | ICD-10-CM

## 2019-06-12 DIAGNOSIS — D509 Iron deficiency anemia, unspecified: Secondary | ICD-10-CM

## 2019-06-12 NOTE — Addendum Note (Signed)
Addended by: Cheron Every on: 06/12/2019 10:42 AM   Modules accepted: Orders

## 2019-06-12 NOTE — Telephone Encounter (Signed)
Called spoke with pt. She is scheduled for 12/11 at 8:30am. Patient aware she needs to hold iron 7 days prior to procedure. I advised will mail instructions with pre-op/covid testing appt. She voiced understanding. Orders entered.

## 2019-06-12 NOTE — Telephone Encounter (Signed)
Noted  

## 2019-06-25 DIAGNOSIS — K7460 Unspecified cirrhosis of liver: Secondary | ICD-10-CM

## 2019-06-25 HISTORY — DX: Unspecified cirrhosis of liver: K74.60

## 2019-07-01 NOTE — Patient Instructions (Signed)
Kristine Rocha  07/01/2019     @PREFPERIOPPHARMACY @   Your procedure is scheduled on  07/05/2019 .  Report to [facility] at  0700  A.M.  Call this number if you have problems the morning of surgery:  [phone]   Remember:  Follow the diet instructions given to you by Dr Roseanne Kaufman office.                      Take these medicines the morning of surgery with A SIP OF WATER  Gabapentin, hyoscyamine, levothyroxine, metoprolol, prilosec.    Do not wear jewelry, make-up or nail polish.  Do not wear lotions, powders, or perfumes. Please wear deodorant and brush your teeth..  Do not shave 48 hours prior to surgery.  Men may shave face and neck.  Do not bring valuables to the hospital.  [facility] is not responsible for any belongings or valuables.  Contacts, dentures or bridgework may not be worn into surgery.  Leave your suitcase in the car.  After surgery it may be brought to your room.  For patients admitted to the hospital, discharge time will be determined by your treatment team.  Patients discharged the day of surgery will not be allowed to drive home.   Name and phone number of your driver:   family Special instructions:  DO NOT take any medications for diabetes the morning of your procedure.  Please read over the following fact sheets that you were given. Anesthesia Post-op Instructions and Care and Recovery After Surgery       Upper Endoscopy, Adult, Care After This sheet gives you information about how to care for yourself after your procedure. Your health care provider may also give you more specific instructions. If you have problems or questions, contact your health care provider. What can I expect after the procedure? After the procedure, it is common to have:  A sore throat.  Mild stomach pain or discomfort.  Bloating.  Nausea. Follow these instructions at home:   Follow instructions from your health care provider about what to eat or drink  after your procedure.  Return to your normal activities as told by your health care provider. Ask your health care provider what activities are safe for you.  Take over-the-counter and prescription medicines only as told by your health care provider.  Do not drive for 24 hours if you were given a sedative during your procedure.  Keep all follow-up visits as told by your health care provider. This is important. Contact a health care provider if you have:  A sore throat that lasts longer than one day.  Trouble swallowing. Get help right away if:  You vomit blood or your vomit looks like coffee grounds.  You have: ? A fever. ? Bloody, black, or tarry stools. ? A severe sore throat or you cannot swallow. ? Difficulty breathing. ? Severe pain in your chest or abdomen. Summary  After the procedure, it is common to have a sore throat, mild stomach discomfort, bloating, and nausea.  Do not drive for 24 hours if you were given a sedative during the procedure.  Follow instructions from your health care provider about what to eat or drink after your procedure.  Return to your normal activities as told by your health care provider. This information is not intended to replace advice given to you by your health care provider. Make sure you discuss any questions you have  with your health care provider. Document Released: 01/10/2012 Document Revised: 01/02/2018 Document Reviewed: 12/11/2017 Elsevier Patient Education  2020 [employer].  Esophageal Dilatation Esophageal dilatation, also called esophageal dilation, is a procedure to widen or open (dilate) a blocked or narrowed part of the esophagus. The esophagus is the part of the body that moves food and liquid from the mouth to the stomach. You may need this procedure if:  You have a buildup of scar tissue in your esophagus that makes it difficult, painful, or impossible to swallow. This can be caused by gastroesophageal reflux disease  (GERD).  You have cancer of the esophagus.  There is a problem with how food moves through your esophagus. In some cases, you may need this procedure repeated at a later time to dilate the esophagus gradually. Tell a health care provider about:  Any allergies you have.  All medicines you are taking, including vitamins, herbs, eye drops, creams, and over-the-counter medicines.  Any problems you or family members have had with anesthetic medicines.  Any blood disorders you have.  Any surgeries you have had.  Any medical conditions you have.  Any antibiotic medicines you are required to take before dental procedures.  Whether you are pregnant or may be pregnant. What are the risks? Generally, this is a safe procedure. However, problems may occur, including:  Bleeding due to a tear in the lining of the esophagus.  A hole (perforation) in the esophagus. What happens before the procedure?  Follow instructions from your health care provider about eating or drinking restrictions.  Ask your health care provider about changing or stopping your regular medicines. This is especially important if you are taking diabetes medicines or blood thinners.  Plan to have someone take you home from the hospital or clinic.  Plan to have a responsible adult care for you for at least 24 hours after you leave the hospital or clinic. This is important. What happens during the procedure?  You may be given a medicine to help you relax (sedative).  A numbing medicine may be sprayed into the back of your throat, or you may gargle the medicine.  Your health care provider may perform the dilatation using various surgical instruments, such as: ? Simple dilators. This instrument is carefully placed in the esophagus to stretch it. ? Guided wire bougies. This involves using an endoscope to insert a wire into the esophagus. A dilator is passed over this wire to enlarge the esophagus. Then the wire is removed.  ? Balloon dilators. An endoscope with a small balloon at the end is inserted into the esophagus. The balloon is inflated to stretch the esophagus and open it up. The procedure may vary among health care providers and hospitals. What happens after the procedure?  Your blood pressure, heart rate, breathing rate, and blood oxygen level will be monitored until the medicines you were given have worn off.  Your throat may feel slightly sore and numb. This will improve slowly over time.  You will not be allowed to eat or drink until your throat is no longer numb.  When you are able to drink, urinate, and sit on the edge of the bed without nausea or dizziness, you may be able to return home. Follow these instructions at home:  Take over-the-counter and prescription medicines only as told by your health care provider.  Do not drive for 24 hours if you were given a sedative during your procedure.  You should have a responsible adult with you  for 24 hours after the procedure.  Follow instructions from your health care provider about any eating or drinking restrictions.  Do not use any products that contain nicotine or tobacco, such as cigarettes and e-cigarettes. If you need help quitting, ask your health care provider.  Keep all follow-up visits as told by your health care provider. This is important. Get help right away if you:  Have a fever.  Have chest pain.  Have pain that is not relieved by medication.  Have trouble breathing.  Have trouble swallowing.  Vomit blood. Summary  Esophageal dilatation, also called esophageal dilation, is a procedure to widen or open (dilate) a blocked or narrowed part of the esophagus.  Plan to have someone take you home from the hospital or clinic.  For this procedure, a numbing medicine may be sprayed into the back of your throat, or you may gargle the medicine.  Do not drive for 24 hours if you were given a sedative during your procedure. This  information is not intended to replace advice given to you by your health care provider. Make sure you discuss any questions you have with your health care provider. Document Released: 09/01/2005 Document Revised: 06/23/2017 Document Reviewed: 05/16/2017 Elsevier Patient Education  2020 [place] After These instructions provide you with information about caring for yourself after your procedure. Your health care provider may also give you more specific instructions. Your treatment has been planned according to current medical practices, but problems sometimes occur. Call your health care provider if you have any problems or questions after your procedure. What can I expect after the procedure? After your procedure, you may:  Feel sleepy for several hours.  Feel clumsy and have poor balance for several hours.  Feel forgetful about what happened after the procedure.  Have poor judgment for several hours.  Feel nauseous or vomit.  Have a sore throat if you had a breathing tube during the procedure. Follow these instructions at home: For at least 24 hours after the procedure:      Have a responsible adult stay with you. It is important to have someone help care for you until you are awake and alert.  Rest as needed.  Do not: ? Participate in activities in which you could fall or become injured. ? Drive. ? Use heavy machinery. ? Drink alcohol. ? Take sleeping pills or medicines that cause drowsiness. ? Make important decisions or sign legal documents. ? Take care of children on your own. Eating and drinking  Follow the diet that is recommended by your health care provider.  If you vomit, drink water, juice, or soup when you can drink without vomiting.  Make sure you have little or no nausea before eating solid foods. General instructions  Take over-the-counter and prescription medicines only as told by your health care provider.  If you  have sleep apnea, surgery and certain medicines can increase your risk for breathing problems. Follow instructions from your health care provider about wearing your sleep device: ? Anytime you are sleeping, including during daytime naps. ? While taking prescription pain medicines, sleeping medicines, or medicines that make you drowsy.  If you smoke, do not smoke without supervision.  Keep all follow-up visits as told by your health care provider. This is important. Contact a health care provider if:  You keep feeling nauseous or you keep vomiting.  You feel light-headed.  You develop a rash.  You have a fever. Get help right away  if:  You have trouble breathing. Summary  For several hours after your procedure, you may feel sleepy and have poor judgment.  Have a responsible adult stay with you for at least 24 hours or until you are awake and alert. This information is not intended to replace advice given to you by your health care provider. Make sure you discuss any questions you have with your health care provider. Document Released: 11/01/2015 Document Revised: 10/09/2017 Document Reviewed: 11/01/2015 Elsevier Patient Education  2020 [employer].

## 2019-07-03 ENCOUNTER — Other Ambulatory Visit: Payer: Self-pay

## 2019-07-03 ENCOUNTER — Other Ambulatory Visit (HOSPITAL_COMMUNITY)
Admission: RE | Admit: 2019-07-03 | Discharge: 2019-07-03 | Disposition: A | Discharge status: Home / Self Care | Payer: Medicare Other | Source: Ambulatory Visit | Attending: Internal Medicine | Admitting: Internal Medicine

## 2019-07-03 ENCOUNTER — Encounter (HOSPITAL_COMMUNITY)
Admission: RE | Admit: 2019-07-03 | Discharge: 2019-07-03 | Disposition: A | Discharge status: Home / Self Care | Payer: Medicare Other | Source: Ambulatory Visit | Attending: Internal Medicine | Admitting: Internal Medicine

## 2019-07-03 ENCOUNTER — Encounter (HOSPITAL_COMMUNITY): Payer: Self-pay

## 2019-07-03 DIAGNOSIS — Z01812 Encounter for preprocedural laboratory examination: Secondary | ICD-10-CM | POA: Diagnosis not present

## 2019-07-03 HISTORY — DX: Gastro-esophageal reflux disease without esophagitis: K21.9

## 2019-07-03 LAB — BASIC METABOLIC PANEL
Anion gap: 11 (ref 5–15)
BUN: 17 mg/dL (ref 8–23)
CO2: 23 mmol/L (ref 22–32)
Calcium: 8.9 mg/dL (ref 8.9–10.3)
Chloride: 102 mmol/L (ref 98–111)
Creatinine, Ser: 1.22 mg/dL — ABNORMAL HIGH (ref 0.44–1.00)
GFR calc Af Amer: 54 mL/min — ABNORMAL LOW (ref >60)
GFR calc non Af Amer: 47 mL/min — ABNORMAL LOW (ref >60)
Glucose, Bld: 131 mg/dL — ABNORMAL HIGH (ref 70–99)
Potassium: 4.5 mmol/L (ref 3.5–5.1)
Sodium: 136 mmol/L (ref 135–145)

## 2019-07-03 LAB — CBC WITH DIFFERENTIAL/PLATELET
Abs Immature Granulocytes: 0.01 10*3/uL (ref 0.00–0.07)
Basophils Absolute: 0 10*3/uL (ref 0.0–0.1)
Basophils Relative: 0 %
Eosinophils Absolute: 0 10*3/uL (ref 0.0–0.5)
Eosinophils Relative: 0 %
HCT: 36.7 % (ref 36.0–46.0)
Hemoglobin: 11.1 g/dL — ABNORMAL LOW (ref 12.0–15.0)
Immature Granulocytes: 0 %
Lymphocytes Relative: 30 %
Lymphs Abs: 1.3 10*3/uL (ref 0.7–4.0)
MCH: 27.3 pg (ref 26.0–34.0)
MCHC: 30.2 g/dL (ref 30.0–36.0)
MCV: 90.4 fL (ref 80.0–100.0)
Monocytes Absolute: 0.4 10*3/uL (ref 0.1–1.0)
Monocytes Relative: 9 %
Neutro Abs: 2.6 10*3/uL (ref 1.7–7.7)
Neutrophils Relative %: 61 %
Platelets: 163 10*3/uL (ref 150–400)
RBC: 4.06 MIL/uL (ref 3.87–5.11)
RDW: 22.5 % — ABNORMAL HIGH (ref 11.5–15.5)
WBC: 4.2 10*3/uL (ref 4.0–10.5)
nRBC: 0 % (ref 0.0–0.2)

## 2019-07-03 LAB — SARS CORONAVIRUS 2 (TAT 6-24 HRS): SARS Coronavirus 2: NEGATIVE

## 2019-07-04 ENCOUNTER — Encounter (HOSPITAL_COMMUNITY): Payer: Self-pay | Admitting: Anesthesiology

## 2019-07-05 ENCOUNTER — Encounter (HOSPITAL_COMMUNITY): Admission: RE | Payer: Self-pay | Source: Home / Self Care

## 2019-07-05 ENCOUNTER — Telehealth: Payer: Self-pay | Admitting: *Deleted

## 2019-07-05 ENCOUNTER — Ambulatory Visit (HOSPITAL_COMMUNITY): Admission: RE | Admit: 2019-07-05 | Payer: Medicare Other | Source: Home / Self Care | Admitting: Internal Medicine

## 2019-07-05 SURGERY — ESOPHAGOGASTRODUODENOSCOPY (EGD) WITH PROPOFOL
Anesthesia: Monitor Anesthesia Care

## 2019-07-05 NOTE — Telephone Encounter (Signed)
Barbara from [facility] called and said that pt would need to be rescheduled.  She is at [place].

## 2019-07-05 NOTE — Telephone Encounter (Signed)
Noted  

## 2019-07-05 NOTE — Telephone Encounter (Signed)
Called and spoke with patient daughter. Patient currently at [facility] ED. Patient had severe abd pain yesterday, vomiting bile as well. Took her to ED at 4:50am today. So far CT has been done. Awaiting results. She is too call once patient is d/c'd. FYI to [facility]

## 2019-07-16 ENCOUNTER — Telehealth: Payer: Self-pay | Admitting: *Deleted

## 2019-07-16 NOTE — Telephone Encounter (Signed)
Received refill request from [facility] for omeprazole

## 2019-08-05 ENCOUNTER — Telehealth: Payer: Self-pay | Admitting: Internal Medicine

## 2019-08-05 NOTE — Telephone Encounter (Signed)
You are speaking about an EGD with capsule placement?.  Can just plan to reschedule.  But she needs an updated triage in the record; will need information regarding hospitalization.

## 2019-08-05 NOTE — Telephone Encounter (Signed)
Patient called and said she had to cancel her procedure prior due to being in the hospital and she wants to reschedule.  Will she need an office visit?

## 2019-08-05 NOTE — Telephone Encounter (Signed)
[name] please get records on patient from [facility]. Thanks

## 2019-08-06 NOTE — Telephone Encounter (Signed)
Requested records.

## 2019-08-07 NOTE — Telephone Encounter (Signed)
LMOVM for pt to schedule OV for follow up

## 2019-08-07 NOTE — Telephone Encounter (Signed)
Received discharge summary from [facility].  Admit date: 07/05/2019  Discharge date: 07/06/2019  Chief complaint: nausea and vomiting. Hospital course: A 64 year old Caucasian female admitted to the hospital with abdominal pain, possible gastritis.  Pancreatitis ruled out by CT.  Patient had uncontrolled hypertension, recurrent anemia.  Labs: BUN 10, creatinine 0.79, sodium 139, potassium 4.2.  INR 1.1.  WBC 6.6, hemoglobin 11.3.  UA negative.  Covid test negative.  CT of the abdomen and pelvis (per discharge summary): No findings.  No evidence of pancreatitis.  Cirrhosis. Chest x-ray negative.  Received IV fluids.  Protonix given.  Continue other medications.  Final diagnosis: 1.  Possible gastritis 2.  Pancreatitis ruled out. 3.  Uncontrolled urgent hypertension. 4.  Recurrent anemia. 5.  Cirrhosis by the CT scan.  Plan: Will continue current medications. Outpatient follow-up with a gastroenterologists.  Patient already has an appointment with Dr. Gala Romney.

## 2019-08-09 NOTE — Telephone Encounter (Signed)
LMOVM. [name] please schedule OV and mail letter thanks

## 2019-08-09 NOTE — Telephone Encounter (Signed)
OV made and appt card mailed °

## 2019-08-20 DIAGNOSIS — H353211 Exudative age-related macular degeneration, right eye, with active choroidal neovascularization: Secondary | ICD-10-CM | POA: Diagnosis not present

## 2019-08-20 DIAGNOSIS — H353132 Nonexudative age-related macular degeneration, bilateral, intermediate dry stage: Secondary | ICD-10-CM | POA: Diagnosis not present

## 2019-08-20 DIAGNOSIS — H35721 Serous detachment of retinal pigment epithelium, right eye: Secondary | ICD-10-CM | POA: Diagnosis not present

## 2019-08-20 DIAGNOSIS — E119 Type 2 diabetes mellitus without complications: Secondary | ICD-10-CM | POA: Diagnosis not present

## 2019-08-23 DIAGNOSIS — H353211 Exudative age-related macular degeneration, right eye, with active choroidal neovascularization: Secondary | ICD-10-CM | POA: Diagnosis not present

## 2019-08-23 DIAGNOSIS — J209 Acute bronchitis, unspecified: Secondary | ICD-10-CM | POA: Diagnosis not present

## 2019-08-23 DIAGNOSIS — Z299 Encounter for prophylactic measures, unspecified: Secondary | ICD-10-CM | POA: Diagnosis not present

## 2019-08-23 DIAGNOSIS — Z789 Other specified health status: Secondary | ICD-10-CM | POA: Diagnosis not present

## 2019-08-25 ENCOUNTER — Encounter: Payer: Self-pay | Admitting: Gastroenterology

## 2019-08-25 NOTE — Progress Notes (Signed)
Referring Provider: Glenda Chroman, MD Primary Care Physician:  Glenda Chroman, MD Primary GI Physician: Dr. Gala Romney  Chief Complaint  Patient presents with  . Follow-up    gastritis,consult for EGD    HPI:   Kristine Rocha is a 64 y.o. female presenting today to discuss rescheduling her EGD with capsule placement.  Patient was last seen on 06/10/2019 for initial consult due to iron deficiency anemia. Labs with PCP on 05/24/2019 with hemoglobin 8.8L with microcytic indices.  Iron panel with ferritin 11L and iron saturation 9%L.  She had received 2 units of blood in 2019.  TCS and EGD in September 2019 at [facility] for IDA was normal.  Per patient, 15-20 years prior, she had EGD revealing PUD and H. pylori.  She was without bright red blood per rectum or melena.  GERD symptoms at least once a day, rare epigastric pain after fried or spicy foods.  Also with dysphagia a couple times a week, nausea a few times a week, occasional vomiting.  No hematemesis.  Intentional weight loss.  She was not on a PPI as Prevacid had caused diarrhea, Protonix and Nexium cause bloating.  Plans were to start omeprazole 40 mg daily, proceed with EGD +/-dilation with placement of Givens capsule at the time of EGD for further evaluation of upper GI symptoms and IDA.  Patient was scheduled for 07/05/2019; however, patient called to cancel procedure as she was at [facility] on the day of her procedure for abdominal pain and vomiting.  Records received from hospital.  Patient was admitted 07/05/19-07/06/19.  Labs unrevealing.  WBC 6.6, hemoglobin improved to 11.3.  Kidney function electrolytes normal.  Covid negative.  Per discharge summary, CT abdomen and pelvis without evidence of pancreatitis.  It did reveal cirrhosis.  Suspected gastritis and advised to continue current medications, follow-up with outpatient GI.  Today: Patient is requesting not to have Givens capsule completed.  States she just wants the EGD for  now.  Discussed Givens capsule with her further, and she again stated she did not want to have this done.  States she will consider it later.  Requesting test for H. Pylori.  States she had H. pylori many years ago with a small ulcer and wonders if she has it again.  Went to the hospital due to epigastric pain/burning that was going into her back. Also with increased nausea and vomiting. No hematemesis. Started the evening prior to her procedure when she was on clear liquids. Could not get comfortable. While she was in the emergency room, states she received a GI cocktail and it cleared everything up. She felt 100% better. Was started on Carafate and this has helped tremendously.  Unfortunately, she has run out and is starting to have return of epigastric burning.   Reflux has improved. Symptoms a couple times a week. Mild nausea. No vomiting except the day she presented to the ED. no hematemesis.  Avoiding fried/fatty/spicy foods. Drinking gingerale. Has gained some weight.   No NSAIDs.   Continues with dysphagia a couple times a week. Typically with meats and breads. Around sternal notch. No trouble with liquids or soft foods.   BMs daily. No blood in the stool or black stool.   Cirrhosis:  No alcohol. No IV/intranasal drug use. Father was an alcoholic and had liver trouble. Maternal Grandfather had pancreatic cancer. Personal history of hypothyroidism, psoriasis, RA, and psoriatic arthritis. Brother and sister both had hepatitis C. 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 No family history hemochromatosis, alpha-1 antitrypsin deficiency. Feels like she has heard of wilson's disease in the family but not sure. No blood transfusions prior to 1990s. No tattoos. Intermittent mild swelling in LE. No abdominal swelling. No jaundice. No confusion. Admits to bruising easily.     Has chronic intermittent lightheadedness/dizziness related to POTS. Has passed out from this in the past. Not recently.  No fever or chills. Had a cough last week and was placed on a z-pack. This has resolved. No nasal congestion or sore throat. No chest pain. Occasional palpitations due to POTS. Some shortness of breath with exertion, none at rest.   Past Medical History:  Diagnosis Date  . Asthma   . Diabetes ([place])   . Essential hypertension   . GERD (gastroesophageal reflux disease)   . History of GI bleed   . Hypothyroidism   . Microcytic anemia   . Palpitations   . Peptic ulcer disease   . Postural orthostatic tachycardia syndrome    Mildly orthostatic results tilt table test 2005  - Dr. Caryl Comes  . Psoriasis   . Rheumatoid arthritis [facility])     Past Surgical History:  Procedure Laterality Date  . ABDOMINAL HERNIA REPAIR  15 years ago   umbilical with mesh  . ABDOMINAL HYSTERECTOMY  20 years ago   . CATARACT EXTRACTION, BILATERAL Bilateral   . CHOLECYSTECTOMY    . COLONOSCOPY WITH ESOPHAGOGASTRODUODENOSCOPY (EGD)  03/2018   [facility]; indication:IDA; Normal exam.   . TILT TABLE STUDY  2005   Caryl Comes  . TRIGGER FINGER RELEASE Right    2nd finger    Current Outpatient Medications  Medication Sig Dispense Refill  . allopurinol (ZYLOPRIM) 300 MG tablet Take 300 mg by mouth every evening.     Marland Kitchen atorvastatin (LIPITOR) 10 MG tablet Take 10 mg by mouth every evening.     . cetirizine (ZYRTEC) 10 MG tablet Take 10 mg by mouth daily.    . Cyanocobalamin (VITAMIN B-12 PO) Take by mouth daily.    Sherrine Maples (IMMUNE HEALTH BLEND PO) Take 1 capsule by mouth every evening. Immune Shield Immune Boost Supplement    . Iron, Ferrous Sulfate, 325 (65 Fe) MG TABS Take by mouth daily.    Marland Kitchen levothyroxine (SYNTHROID) 88 MCG tablet Take 88 mcg by mouth daily.    . Melatonin 10 MG TABS Take 10 mg by mouth at bedtime.     . metFORMIN (GLUCOPHAGE-XR) 500 MG 24 hr tablet Take 500 mg by mouth every evening.   0  . metoprolol succinate (TOPROL-XL) 100 MG 24 hr tablet Take 100 mg by mouth daily. *Hold for  low blood pressure*    . Multiple Vitamins-Minerals (PRESERVISION AREDS 2 PO) Take 1 tablet by mouth 2 (two) times daily.    Glory Rosebush VERIO test strip   0  . tiZANidine (ZANAFLEX) 4 MG tablet Take 8 mg by mouth at bedtime.    Marland Kitchen omeprazole (PRILOSEC) 40 MG capsule Take 1 capsule (40 mg total) by mouth 2 (two) times daily before a meal. 60 capsule 3  . sucralfate (CARAFATE) 1 GM/10ML suspension Take 10 mLs (1 g total) by mouth 4 (four) times daily -  with meals and at bedtime. 420 mL 1   No current facility-administered medications for this visit.    Allergies as of 08/26/2019 - Review Complete 08/26/2019  Allergen Reaction Noted  . Amoxicillin Hives, Shortness Of Breath, and Rash 11/19/2014  . Ampicillin Hives,  Shortness Of Breath, and Rash 11/19/2014  . Other  11/19/2014  . Prevacid [lansoprazole] Diarrhea 06/24/2019    Family History  Problem Relation Age of Onset  . Heart attack Father        Age 62  . Arrhythmia Sister        WPW s/p ablation- corrected at [facility]  . Alzheimer's disease Mother   . Pneumonia Mother        Aspiration  . Heart attack Brother   . Heart attack Maternal Grandfather   . Colon cancer Maternal Aunt        diagnosed at 37    Social History   Socioeconomic History  . Marital status: Married    Spouse name: Not on file  . Number of children: Not on file  . Years of education: Not on file  . Highest education level: Not on file  Occupational History  . Not on file  Tobacco Use  . Smoking status: Never Smoker  . Smokeless tobacco: Never Used  Substance and Sexual Activity  . Alcohol use: No    Alcohol/week: 0.0 standard drinks  . Drug use: No  . Sexual activity: Yes  Other Topics Concern  . Not on file  Social History Narrative  . Not on file   Social Determinants of Health   Financial Resource Strain:   . Difficulty of Paying Living Expenses: Not on file  Food Insecurity:   . Worried About Charity fundraiser in the Last Year: Not  on file  . Ran Out of Food in the Last Year: Not on file  Transportation Needs:   . Lack of Transportation (Medical): Not on file  . Lack of Transportation (Non-Medical): Not on file  Physical Activity:   . Days of Exercise per Week: Not on file  . Minutes of Exercise per Session: Not on file  Stress:   . Feeling of Stress : Not on file  Social Connections:   . Frequency of Communication with Friends and Family: Not on file  . Frequency of Social Gatherings with Friends and Family: Not on file  . Attends Religious Services: Not on file  . Active Member of Clubs or Organizations: Not on file  . Attends Archivist Meetings: Not on file  . Marital Status: Not on file    Review of Systems: Gen: See HPI  CV:See HPI Resp: See HPI GI: See HPI Derm: Denies rash Psych: Denies depression or anxiety Heme: See HPI  Physical Exam: BP (!) 106/59   Pulse (!) 50   Temp (!) 96.9 F (36.1 C)   Ht 5' 0.5" (1.537 m)   Wt 174 lb 3.2 oz (79 kg)   BMI 33.46 kg/m  General:   Alert and oriented. No distress noted. Pleasant and cooperative.  Head:  Normocephalic and atraumatic. Eyes:  Conjuctiva clear without scleral icterus. Heart:  S1, S2 present without murmurs appreciated. Lungs:  Clear to auscultation bilaterally. No wheezes, rales, or rhonchi. No distress.  Abdomen:  +BS, soft, and non-distended. Mild tenderness to palpation in the epigastric area. No rebound or guarding. No HSM or masses noted. Msk:  Symmetrical without gross deformities. Normal posture. Extremities:  Without edema. Neurologic:  Alert and  oriented x4 Psych: Normal mood and affect.

## 2019-08-26 ENCOUNTER — Ambulatory Visit: Payer: Medicare Other | Admitting: Gastroenterology

## 2019-08-26 ENCOUNTER — Other Ambulatory Visit: Payer: Self-pay

## 2019-08-26 ENCOUNTER — Encounter: Payer: Self-pay | Admitting: Gastroenterology

## 2019-08-26 VITALS — BP 106/59 | HR 50 | Temp 96.9°F | Ht 60.5 in | Wt 174.2 lb

## 2019-08-26 DIAGNOSIS — R1013 Epigastric pain: Secondary | ICD-10-CM | POA: Diagnosis not present

## 2019-08-26 DIAGNOSIS — D509 Iron deficiency anemia, unspecified: Secondary | ICD-10-CM | POA: Diagnosis not present

## 2019-08-26 DIAGNOSIS — K219 Gastro-esophageal reflux disease without esophagitis: Secondary | ICD-10-CM

## 2019-08-26 DIAGNOSIS — K7460 Unspecified cirrhosis of liver: Secondary | ICD-10-CM | POA: Diagnosis not present

## 2019-08-26 DIAGNOSIS — R1310 Dysphagia, unspecified: Secondary | ICD-10-CM

## 2019-08-26 MED ORDER — OMEPRAZOLE 40 MG PO CPDR: 40.0000 mg | DELAYED_RELEASE_CAPSULE | Freq: Two times a day (BID) | ORAL | 3 refills | Status: DC

## 2019-08-26 MED ORDER — SUCRALFATE 1 GM/10ML PO SUSP: 1.0000 g | Freq: Three times a day (TID) | ORAL | 1 refills | Status: DC

## 2019-08-26 NOTE — Patient Instructions (Addendum)
We will get you scheduled for an upper endoscopy with possible dilation of your esophagus in the near future by Dr. Gala Romney.  I am increasing your omeprazole to 40 mg twice daily.  Take this 30 minutes before breakfast and 30 minutes before dinner.  I am also sending in a prescription of Carafate as she felt this helped your symptoms significantly.  You may take this up to 3 times daily before meals and at bedtime as needed.  Please have labs and ultrasound of your abdomen completed.  We will call you with results and plan to follow-up with you in 6 months.  Call if you have questions or concerns prior.  Aliene Altes, PA-C [facility]  Cirrhosis  Cirrhosis is long-term (chronic) liver injury. The liver is the body's largest internal organ, and it performs many functions. It converts food into energy, removes toxic material from the blood, makes important proteins, and absorbs necessary vitamins from food. In cirrhosis, healthy liver cells are replaced by scar tissue. This prevents blood from flowing through the liver, making it difficult for the liver to function. Scarring of the liver cannot be reversed, but treatment can prevent it from getting worse. What are the causes? Common causes of this condition are hepatitis C and long-term alcohol abuse. Other causes include:  Nonalcoholic fatty liver disease. This happens when fat is deposited in the liver by causes other than alcohol.  Hepatitis B infection.  Autoimmune hepatitis. In this condition, the body's defense system (immune system) mistakenly attacks the liver cells, causing irritation and swelling (inflammation).  Diseases that cause blockage of ducts inside the liver.  Inherited liver diseases, such as hemochromatosis. This is one of the most common inherited liver diseases. In this disease, deposits of iron collect in the liver and other organs.  Reactions to certain long-term medicines, such as amiodarone, a  heart medicine.  Parasitic infections. These include schistosomiasis, which is caused by a flatworm.  Long-term contact to certain toxins. These toxins include certain organic solvents, such as toluene and chloroform. What increases the risk? You are more likely to develop this condition if:  You have certain types of viral hepatitis.  You abuse alcohol, especially if you are female.  You are overweight.  You share needles.  You have unprotected sex with someone who has viral hepatitis. What are the signs or symptoms? You may not have any signs and symptoms at first. Symptoms may not develop until the damage to your liver starts to get worse. Early symptoms may include:  Weakness and tiredness (fatigue).  Changes in sleep patterns or having trouble sleeping.  Itchiness.  Tenderness in the right-upper part of your abdomen.  Weight loss and muscle loss.  Nausea.  Loss of appetite.  Appearance of tiny blood vessels under the skin. Later symptoms may include:  Fatigue or weakness that is getting worse.  Yellow skin and eyes (jaundice).  Buildup of fluid in the abdomen (ascites). You may notice that your clothes are tight around your waist.  Weight gain.  Swelling of the feet and ankles (edema).  Trouble breathing.  Easy bruising and bleeding.  Vomiting blood.  Black or bloody stool.  Mental confusion. How is this diagnosed? Your health care provider may suspect cirrhosis based on your symptoms and medical history, especially if you have other medical conditions or a history of alcohol abuse. Your health care provider will do a physical exam to feel your liver and to check for signs of cirrhosis. He or she  may perform other tests, including:  Blood tests to check: ? For hepatitis B or C. ? Kidney function. ? Liver function.  Imaging tests such as: ? MRI or CT scan to look for changes seen in advanced cirrhosis. ? Ultrasound to see if normal liver tissue  is being replaced by scar tissue.  A procedure in which a long needle is used to take a sample of liver tissue to be checked in a lab (biopsy). Liver biopsy can confirm the diagnosis of cirrhosis. How is this treated? Treatment for this condition depends on how damaged your liver is and what caused the damage. It may include treating the symptoms of cirrhosis, or treating the underlying causes in order to slow the damage. Treatment may include:  Making lifestyle changes, such as: ? Eating a healthy diet. You may need to work with your health care provider or a diet and nutrition specialist (dietitian) to develop an eating plan. ? Restricting salt intake. ? Maintaining a healthy weight. ? Not abusing drugs or alcohol.  Taking medicines to: ? Treat liver infections or other infections. ? Control itching. ? Reduce fluid buildup. ? Reduce certain blood toxins. ? Reduce risk of bleeding from enlarged blood vessels in the stomach or esophagus (varices).  Liver transplant. In this procedure, a liver from a donor is used to replace your diseased liver. This is done if cirrhosis has caused liver failure. Other treatments and procedures may be done depending on the problems that you get from cirrhosis. Common problems include liver-related kidney failure (hepatorenal syndrome). Follow these instructions at home:   Take medicines only as told by your health care provider. Do not use medicines that are toxic to your liver. Ask your health care provider before taking any new medicines, including over-the-counter medicines.  Rest as needed.  Eat a well-balanced diet. Ask your health care provider or dietitian for more information.  Limit your salt or water intake, if your health care provider asks you to do this.  Do not drink alcohol. This is especially important if you are taking acetaminophen.  Keep all follow-up visits as told by your health care provider. This is important. Contact a health  care provider if you:  Have fatigue or weakness that is getting worse.  Develop swelling of the hands, feet, legs, or face.  Have a fever.  Develop loss of appetite.  Have nausea or vomiting.  Develop jaundice.  Develop easy bruising or bleeding. Get help right away if you:  Vomit bright red blood or a material that looks like coffee grounds.  Have blood in your stools.  Notice that your stools appear black and tarry.  Become confused.  Have chest pain or trouble breathing. Summary  Cirrhosis is chronic liver injury. Liver damage cannot be reversed. Common causes are hepatitis C and long-term alcohol abuse.  Tests used to diagnose cirrhosis include blood tests, imaging tests, and liver biopsy.  Treatment for this condition involves treating the underlying cause. Avoid alcohol, drugs, salt, and medicines that may damage your liver.  Contact your health care provider if you develop ascites, edema, jaundice, fever, nausea or vomiting, easy bruising or bleeding, or worsening fatigue. This information is not intended to replace advice given to you by your health care provider. Make sure you discuss any questions you have with your health care provider. Document Revised: 10/31/2018 Document Reviewed: 05/31/2017 Elsevier Patient Education  [place].

## 2019-08-28 ENCOUNTER — Other Ambulatory Visit: Payer: Self-pay

## 2019-08-28 DIAGNOSIS — K769 Liver disease, unspecified: Secondary | ICD-10-CM

## 2019-08-28 DIAGNOSIS — K7460 Unspecified cirrhosis of liver: Secondary | ICD-10-CM

## 2019-08-28 DIAGNOSIS — R772 Abnormality of alphafetoprotein: Secondary | ICD-10-CM

## 2019-08-28 DIAGNOSIS — K760 Fatty (change of) liver, not elsewhere classified: Secondary | ICD-10-CM

## 2019-08-28 NOTE — Progress Notes (Signed)
LFTs remain within normal limits.  INR normal.  Hep C nonreactive.  Immune to hep A.  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  Autoimmune work-up thus far with ANA positive.  ANA, immunoglobulins, ceruloplasmin within normal limits.  Waiting on ASMA and alpha 1 antitrypsin phenotype.  AFP slightly elevated at 10.7.  I received the CT report from [facility] dated 07/05/2019.  Cirrhotic liver morphology.  Subcentimeter low-density in the left liver that is cystic appearing.  With slightly elevated AFP and lesion identified on CT, we will pursue MRI of the liver.  I have discussed results with patient and she is aware of plan.  Alicia: Please arrange for hepatitis B surface antigen and hepatitis B core antibody body total.   [facility] Clinical Pool: Please cancel patient's ultrasound and schedule her for an MRI with liver protocol. Dx: Cirrhosis, elevated AFP, liver lesion on CT.

## 2019-08-30 ENCOUNTER — Telehealth: Payer: Self-pay | Admitting: Gastroenterology

## 2019-08-30 ENCOUNTER — Ambulatory Visit (HOSPITAL_COMMUNITY): Payer: Medicare Other

## 2019-08-30 DIAGNOSIS — R1013 Epigastric pain: Secondary | ICD-10-CM | POA: Insufficient documentation

## 2019-08-30 NOTE — Assessment & Plan Note (Addendum)
Patient has had solid food dysphagia for several years with sensation of food hanging around the sternal notch.  Symptoms occurring a couple times a week.  No trouble with soft foods or liquids.  Also with history of GERD that was previously uncontrolled.  Omeprazole 40 mg daily started in November 2020 which has helped.  Currently with breakthrough symptoms a couple times a week.  She does have epigastric pain for which she recently presented to [facility] as discussed below. Significant improvement since starting Carafate.  Relevant history of IDA with hemoglobin 8.8 and ferritin 11 in October 2020.  TCS/EGD in 2019 at [facility] for IDA was normal.  Recent labs on 07/05/2019 while at [facility] ED with hemoglobin improved to 11.3.  Suspect dysphagia is likely secondary to uncontrolled GERD and possible esophageal web, ring, or stricture.  Less likely malignancy.   Proceed with EGD +/- dilation with propofol with Dr. Gala Romney in the near future.  There had been plans for Givens placement for evaluation of IDA; however, patient request not to proceed with Givens capsule at this time. The risks, benefits, and alternatives have been discussed in detail with patient. They have stated understanding and desire to proceed.  Propofol due to patient's significant concern about achieving adequate sedation.  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  Also taking gabapentin and tizanidine. Due to epigastric pain and concern for gastritis, duodenitis, versus H. pylori/PUD, will increase omeprazole to 40 mg twice daily. Refill Carafate prescription. Follow-up in 6 months.

## 2019-08-30 NOTE — Assessment & Plan Note (Signed)
Typical GERD symptoms are now fairly well controlled on omeprazole 40 mg daily.  Breakthrough symptoms a couple times a week.  However, due to epigastric pain/burning as described below, IDA, and reported history of H. pylori with gastric ulcer in the past, will go ahead and increase omeprazole to 40 mg twice daily.  Also sending in prescription of Carafate 3 times daily before meals and at bedtime as patient felt this significantly improved her epigastric pain.  She will have a EGD +/-dilation with propofol with Dr. Gala Romney for dysphagia and dyspepsia in the near future as discussed below.  Follow-up in 6 months.

## 2019-08-30 NOTE — Telephone Encounter (Signed)
Called and informed pt. New procedure instructions mailed.

## 2019-08-30 NOTE — Assessment & Plan Note (Addendum)
Patient presented to [facility] on 07/05/2019 for epigastric pain and vomiting.  CT abdomen and pelvis completed, and per discharge summary, revealed cirrhosis. Unfortunately, I do not have the CT report.  She is without signs or symptoms of advanced liver disease.  She does report intermittent mild swelling in lower extremities.  Denies history of alcohol or IV/intranasal drug use.  Reports vaccination to hepatitis B in the past. No blood transfusions prior to the 1990s.  No tattoos.  Personal history of hypothyroidism, psoriasis, RA, and psoriatic arthritis.  Brother and sister with hepatitis C but no exposures to their blood.  No family history of hemochromatosis, alpha-1 antitrypsin deficiency.  ?  Wilson's disease in the family but not sure.  No recent LFTs on file.  CBC on 07/03/2019 with normal platelets.  Unclear etiology of cirrhosis.  She does have risk factors for cirrhosis secondary to NAFLD with history of obesity, diabetes, HTN, HLD.   CMP, AFP, INR Hepatitis C antibody, hepatitis B surface antibody, hepatitis A antibody total ANA, AMA, ASMA, immunoglobulins, ceruloplasmin, alpha 1 antitrypsin phenotype Ultrasound abdomen complete as we have no imaging on file. She is undergoing EGD +/- dilation for dysphagia, dyspepsia, and IDA which will also evaluate for esophageal varices.  Request CT report. Further recommendations to follow. Follow-up in 6 months.

## 2019-08-30 NOTE — Assessment & Plan Note (Addendum)
64 year old female reports history of PUD and GI bleed 15-20 years ago in the setting of H. Pylori who was found to have iron deficiency anemia with positive Hemoccult card in 2019.  Underwent TCS and EGD at [facility] for IDA which was normal.  Labs through PCP on 04/27/2019 with hemoglobin 8.8 with microcytic indices, ferritin low at 11, iron saturation low at 9%, total iron on lower end of normal at 28.  Started on iron in September 2020.  No significant lower GI symptoms.  Denies bright red blood per rectum or melena.  GERD is now improved on omeprazole 40 mg daily.  However, she does report dyspepsia requiring ED visit at [facility] on 07/05/2019.  Significant improvement after starting Carafate.  Mild nausea.  No vomiting except the day she presented to the ED.  No hematemesis.  She does continue with dysphagia a couple times a week as discussed above.  Weight is stable.  No NSAIDs.  Repeat CBC on 07/03/2019 with hemoglobin improved to 11.1 with normocytic indices. CT abdomen and pelvis while at [facility] revealed cirrhosis.   Since starting omeprazole and iron, it is encouraging that her hemoglobin has improved significantly.  Most suspicious for an upper GI source of IDA including PUD, H. pylori, gastritis, duodenitis, esophagitis, small bowel AVMs. Less likely malignancy. With new diagnosis of cirrhosis, can't rule out portal gastropathy.   Proceed with EGD +/- dilation with propofol with Dr. Buford Dresser in the near future.  There were plans to have Givens capsule placed at the time of EGD; however, patient is requesting not to have this done.  States she will consider this later. The risks, benefits, and alternatives have been discussed in detail with patient. They have stated understanding and desire to proceed.  Propofol due to patient's significant concern about achieving adequate sedation.  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  Also taking gabapentin and tizanidine. Hold iron x7 days  prior to procedure. Increase omeprazole to 40 mg twice daily for now. Refill Carafate prescription as she feels this has helped significantly. Further recommendations to follow EGD. Follow-up in 6 months.

## 2019-08-30 NOTE — Telephone Encounter (Signed)
Patient has EGD in April. I discussed with her holding iron x 7 days prior to her procedure but I do not think I included this on the encounter form. Can you update her instructions if holding iron was not included?

## 2019-08-30 NOTE — Assessment & Plan Note (Addendum)
Patient had acute onset of severe epigastric pain/burning that was going into her back requiring ED visit at [facility] on 07/05/2019.  Labs were unrevealing.  CT abdomen and pelvis without evidence of pancreatitis.  She was started on Carafate and felt this significantly improved her symptoms.  Unfortunately, she has run out of Carafate and is starting to have return of epigastric burning. GERD symptoms are now fairly well controlled since starting omeprazole 40 mg daily in November 2020.  Suspect epigastric pain is likely secondary to gastritis, duodenitis, esophagitis.  However, patient does report history of PUD in the setting of H. pylori about 15-20 years ago.  Also found to have IDA in 2019 with hemoglobin downtrending in October 2020 to 8.8 and ferritin found to be 11L.  Since starting iron and omeprazole, her hemoglobin improved to 11.1 on 07/03/19. Considering this, I am also concerned about PUD/H. Pylori. Can't rule out malignancy.  Proceed with EGD +/- dilation with propofol with Dr. Gala Romney in the near future.  There are plans to have Givens capsule placed at the time of EGD; however, patient desires not to have this done.  States she will consider this later. The risks, benefits, and alternatives have been discussed in detail with patient. They have stated understanding and desire to proceed.  Propofol due to patient's concerns about achieving adequate sedation.  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  Also taking gabapentin and tizanidine. Increase omeprazole to 40 mg twice daily for now. Refill Carafate Avoid all NSAIDs. Further recommendations to follow EGD. Follow-up in 6 months.

## 2019-08-31 LAB — COMPLETE METABOLIC PANEL WITH GFR
AG Ratio: 1.3 (calc) (ref 1.0–2.5)
ALT: 14 U/L (ref 6–29)
AST: 22 U/L (ref 10–35)
Albumin: 3.4 g/dL — ABNORMAL LOW (ref 3.6–5.1)
Alkaline phosphatase (APISO): 115 U/L (ref 37–153)
BUN: 17 mg/dL (ref 7–25)
CO2: 25 mmol/L (ref 20–32)
Calcium: 8.6 mg/dL (ref 8.6–10.4)
Chloride: 106 mmol/L (ref 98–110)
Creat: 0.93 mg/dL (ref 0.50–0.99)
GFR, Est African American: 75 mL/min/{1.73_m2} (ref >=60)
GFR, Est Non African American: 65 mL/min/{1.73_m2} (ref >=60)
Globulin: 2.7 g/dL (calc) (ref 1.9–3.7)
Glucose, Bld: 110 mg/dL (ref 65–139)
Potassium: 4.8 mmol/L (ref 3.5–5.3)
Sodium: 139 mmol/L (ref 135–146)
Total Bilirubin: 0.5 mg/dL (ref 0.2–1.2)
Total Protein: 6.1 g/dL (ref 6.1–8.1)

## 2019-08-31 LAB — HEPATITIS C ANTIBODY
Hepatitis C Ab: NONREACTIVE
SIGNAL TO CUT-OFF: 0.03 (ref <1.00)

## 2019-08-31 LAB — ANTI-NUCLEAR AB-TITER (ANA TITER): ANA Titer 1: 1:320 {titer} — ABNORMAL HIGH

## 2019-08-31 LAB — ANTI-SMOOTH MUSCLE ANTIBODY, IGG: Actin (Smooth Muscle) Antibody (IGG): <20 U (ref <20)

## 2019-08-31 LAB — HEPATITIS B SURFACE ANTIBODY,QUALITATIVE: Hep B S Ab: NONREACTIVE

## 2019-08-31 LAB — HEPATITIS A ANTIBODY, TOTAL: Hepatitis A AB,Total: REACTIVE — AB

## 2019-08-31 LAB — AFP TUMOR MARKER: AFP-Tumor Marker: 10.7 ng/mL — ABNORMAL HIGH

## 2019-08-31 LAB — PROTIME-INR
INR: 1.1
Prothrombin Time: 11.3 s (ref 9.0–11.5)

## 2019-08-31 LAB — ALPHA-1 ANTITRYPSIN PHENOTYPE: A-1 Antitrypsin, Ser: 169 mg/dL (ref 83–199)

## 2019-08-31 LAB — IGG, IGA, IGM
IgG (Immunoglobin G), Serum: 1055 mg/dL (ref 600–1540)
IgM, Serum: 258 mg/dL (ref 50–300)
Immunoglobulin A: 181 mg/dL (ref 70–320)

## 2019-08-31 LAB — MITOCHONDRIAL ANTIBODIES: Mitochondrial M2 Ab, IgG: <=20 U

## 2019-08-31 LAB — ANA: Anti Nuclear Antibody (ANA): POSITIVE — AB

## 2019-08-31 LAB — CERULOPLASMIN: Ceruloplasmin: 28 mg/dL (ref 18–53)

## 2019-09-02 DIAGNOSIS — E119 Type 2 diabetes mellitus without complications: Secondary | ICD-10-CM | POA: Diagnosis not present

## 2019-09-02 DIAGNOSIS — I10 Essential (primary) hypertension: Secondary | ICD-10-CM | POA: Diagnosis not present

## 2019-09-02 NOTE — Progress Notes (Signed)
Cc'ed to pcp °

## 2019-09-03 ENCOUNTER — Other Ambulatory Visit: Payer: Self-pay

## 2019-09-03 ENCOUNTER — Telehealth: Payer: Self-pay

## 2019-09-03 DIAGNOSIS — Z1159 Encounter for screening for other viral diseases: Secondary | ICD-10-CM

## 2019-09-03 NOTE — Telephone Encounter (Signed)
PA for EGD/DIL submitted via [facility] website. Case approved. PA# [id number], valid 11/04/19-02/02/20.

## 2019-09-10 ENCOUNTER — Ambulatory Visit (HOSPITAL_COMMUNITY)
Admission: RE | Admit: 2019-09-10 | Discharge: 2019-09-10 | Disposition: A | Discharge status: Home / Self Care | Payer: Medicare Other | Source: Ambulatory Visit | Attending: Gastroenterology | Admitting: Gastroenterology

## 2019-09-10 ENCOUNTER — Other Ambulatory Visit: Payer: Self-pay

## 2019-09-10 DIAGNOSIS — R772 Abnormality of alphafetoprotein: Secondary | ICD-10-CM | POA: Insufficient documentation

## 2019-09-10 DIAGNOSIS — K7460 Unspecified cirrhosis of liver: Secondary | ICD-10-CM

## 2019-09-10 DIAGNOSIS — K769 Liver disease, unspecified: Secondary | ICD-10-CM | POA: Insufficient documentation

## 2019-09-10 DIAGNOSIS — Z1159 Encounter for screening for other viral diseases: Secondary | ICD-10-CM | POA: Diagnosis not present

## 2019-09-10 DIAGNOSIS — K760 Fatty (change of) liver, not elsewhere classified: Secondary | ICD-10-CM | POA: Diagnosis not present

## 2019-09-10 IMAGING — MR MR ABDOMEN WO/W CM
10 of 18 series · 21 of 48 positions shown · IV contrast (7ml gadavist)
Comparison: Multiple exams, including CT abdomen [DATE]

CLINICAL DATA: Cirrhosis. Small hepatic lesions seen on recent CT.

EXAM:
MRI ABDOMEN WITHOUT AND WITH CONTRAST
TECHNIQUE: Multiplanar multisequence MR imaging of the abdomen was performed
both before and after the administration of intravenous contrast.
CONTRAST:  7mL GADAVIST GADOBUTROL 1 MMOL/ML IV SOLN

[Series 3: T2 · coronal · 5.0mm · 1.08mm/px · 1 of 36 slices shown (1 of 2)]
[im 1/36]
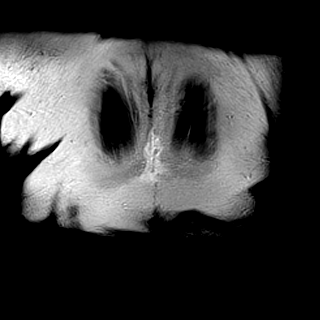

[Series 4: t2fs axial · axial · 5.0mm · 1.19mm/px · 1 of 40 slices shown]
[im 1/40]
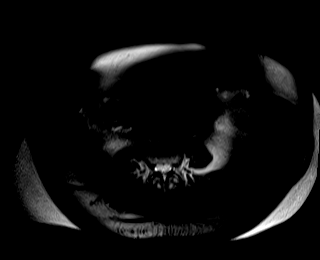

[Series 5: DWI · axial · 5.0mm · 0.99mm/px · z∈[-131,+103]mm · 3 of 80 slices shown (1 of 2)]
[im 1/80]
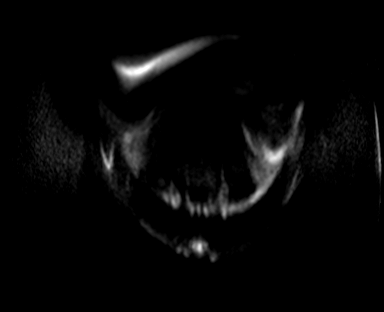
[im 40/80]
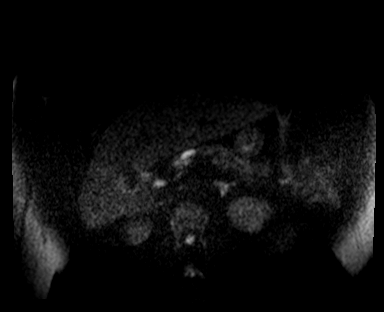
[im 80/80]
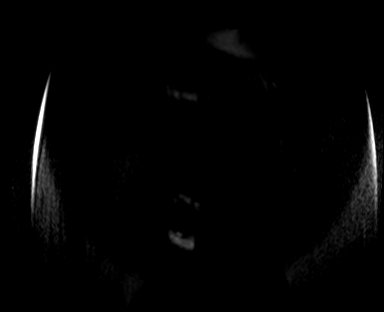

[Series 6: DWI · axial · 5.0mm · 0.99mm/px · 1 of 40 slices shown (2 of 2)]
[im 1/40]
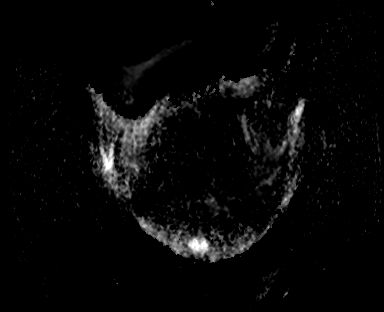

[Series 7: T1 · axial · 4.0mm · 0.59mm/px · z∈[-148,+104]mm · 3 of 64 slices shown (1 of 2)]
[im 1/64]
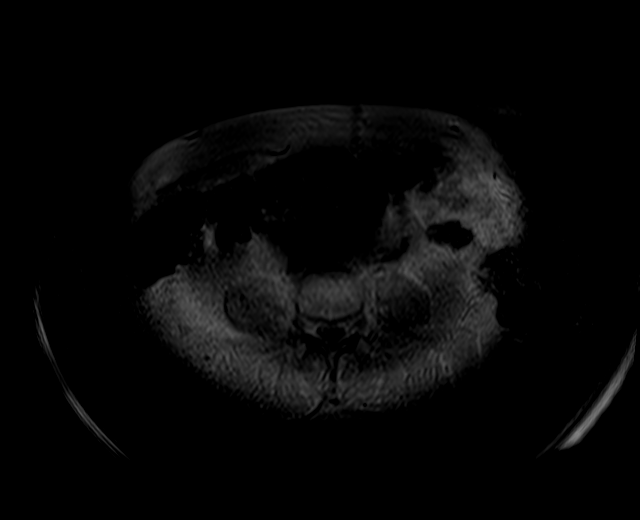
[im 32/64]
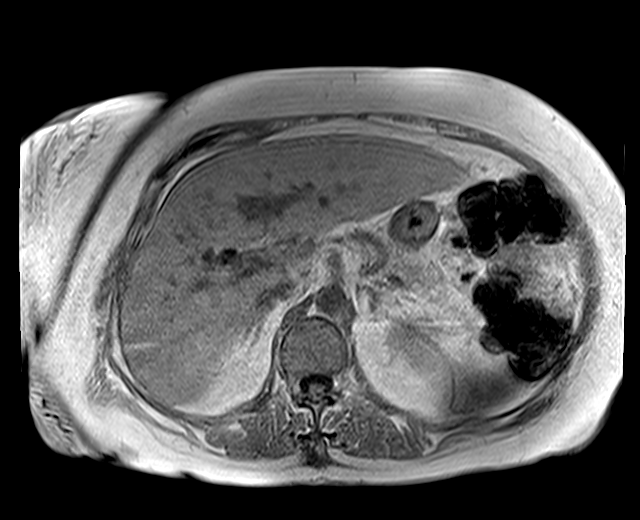
[im 64/64]
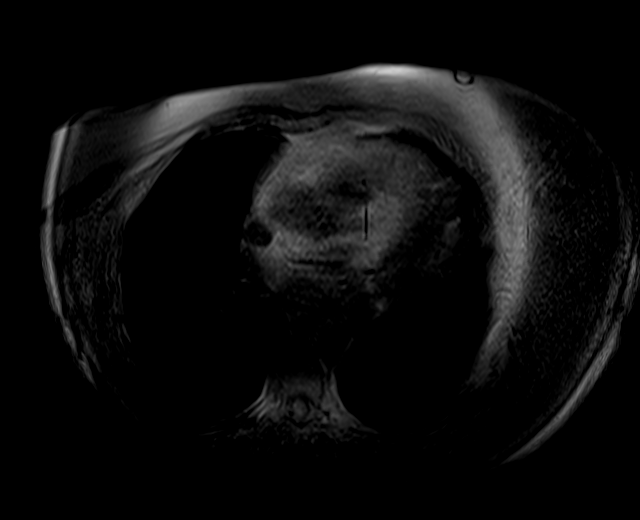

[Series 8: T1 · axial · 4.0mm · 0.59mm/px · z∈[-148,+104]mm · 3 of 64 slices shown (2 of 2)]
[im 1/64]
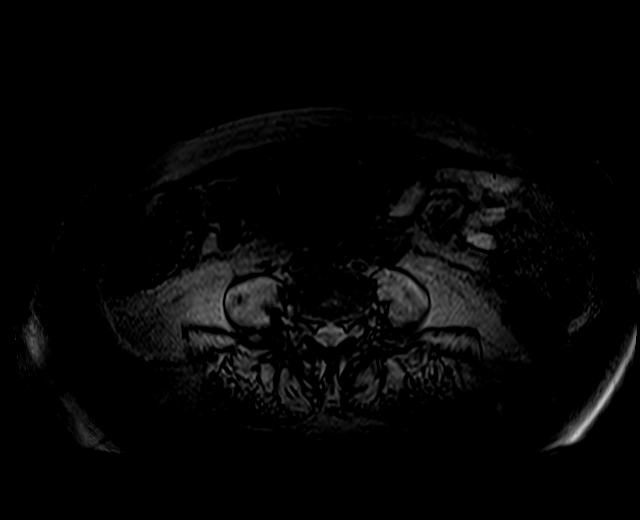
[im 32/64]
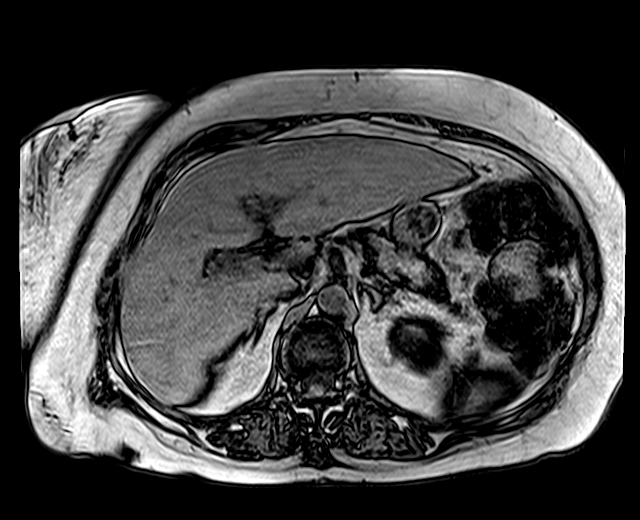
[im 64/64]
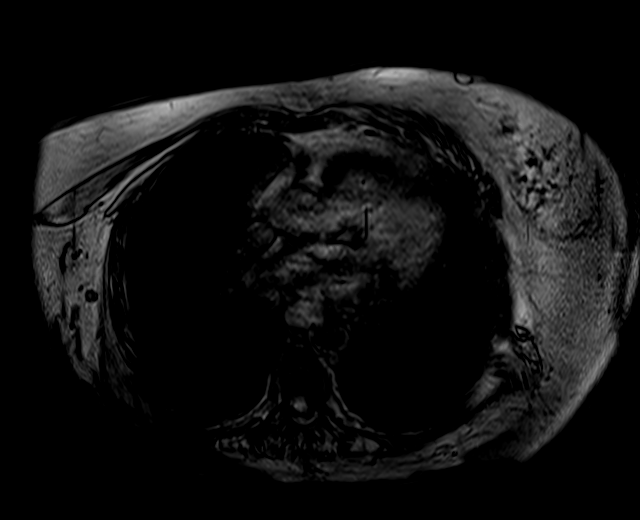

[Series 11: bSSFP · axial · 5.0mm · 0.68mm/px · z∈[-162,+133]mm · 3 of 60 slices shown]
[im 1/60]
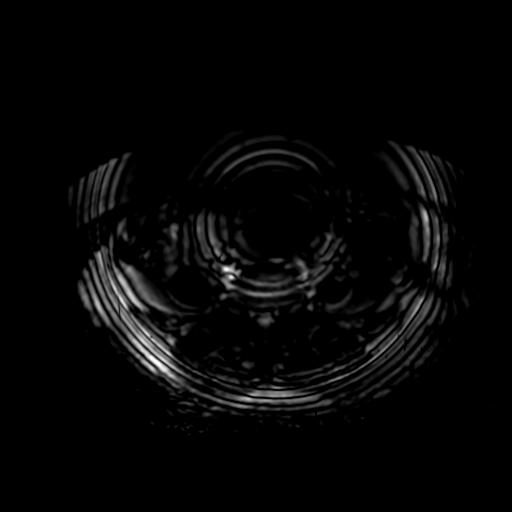
[im 30/60]
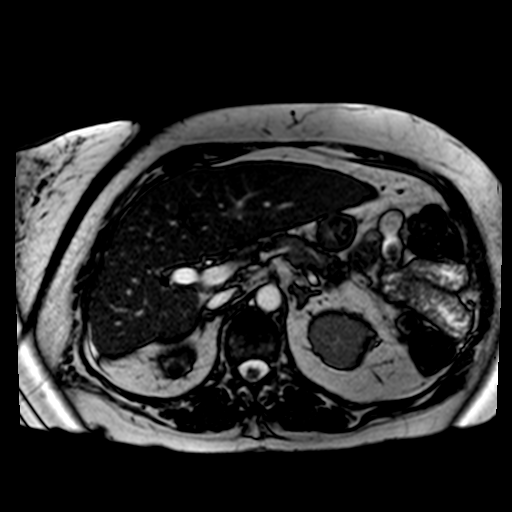
[im 60/60]
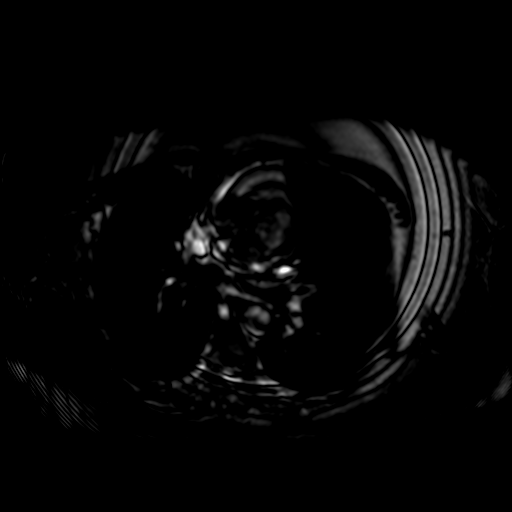

[Series 18: T2 · axial · 5.0mm · 1.48mm/px · z∈[-131,+103]mm · 2 of 40 slices shown (2 of 2)]
[im 1/40]
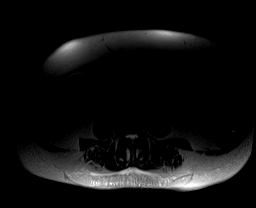
[im 40/40]
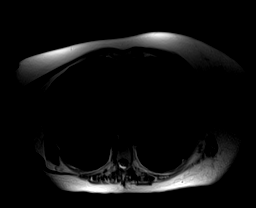

[Series 5002: sub_s14-s13_1 · axial · 3.7mm · 0.62mm/px · z∈[-138,+95]mm · 3 of 64 slices shown]
[im 1/64]
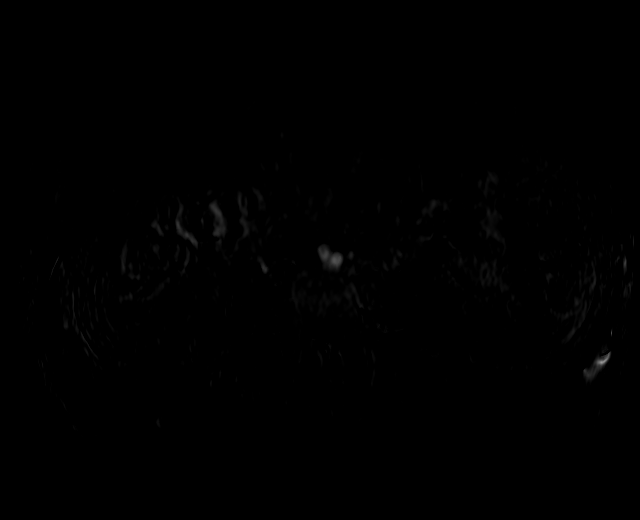
[im 32/64]
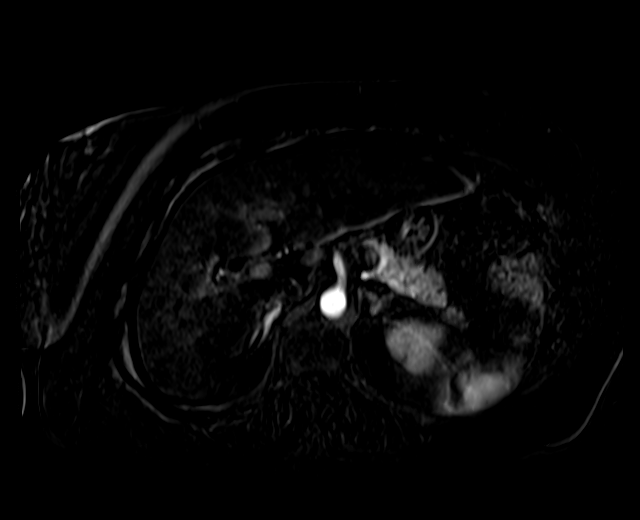
[im 64/64]
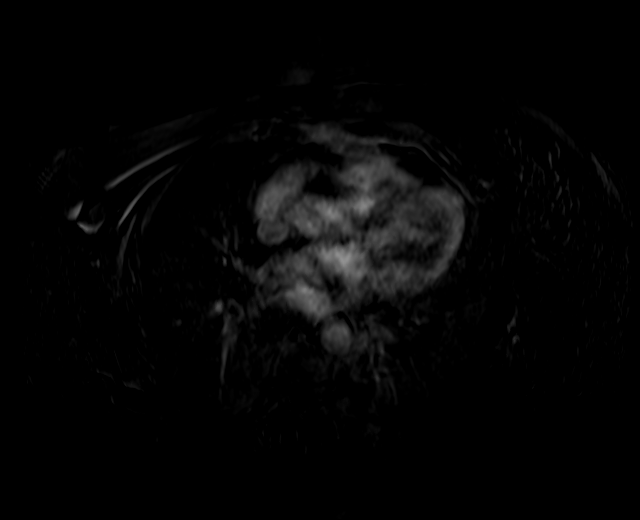

[Series 5003: sub_s15-s13_1 · axial · 3.7mm · 0.62mm/px · 1 of 64 slices shown]
[im 1/64]
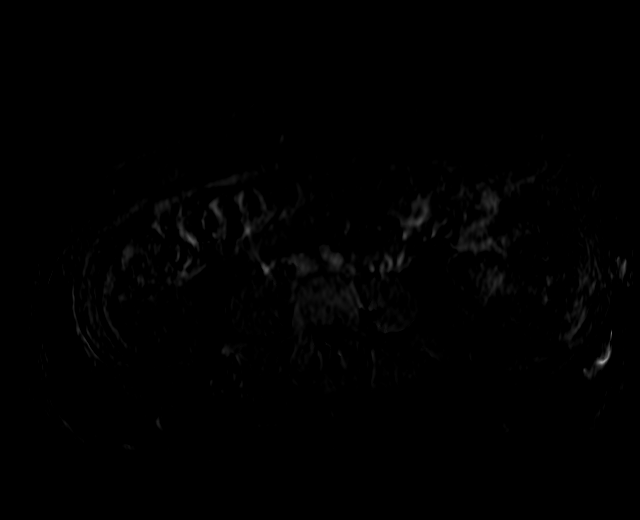

[21 of 48 positions shown; findings below may reference images not displayed]

FINDINGS: Despite efforts by the technologist and patient, motion artifact is
present on today's exam and could not be eliminated. This reduces
exam sensitivity and specificity.

Lower chest: Mild cardiomegaly.

Hepatobiliary: Mild hepatic steatosis with nodularity of the hepatic
margin and morphology of the liver compatible with cirrhosis.
Gallbladder surgically absent.

The lesion of concern in the left hepatic lobe noted on recent CT
measures 5 mm in diameter and has high T2 and low T1 signal
characteristics with no enhancement, compatible with a cyst. No
worrisome focal hepatic lesions are identified. Continued
extrahepatic biliary prominence, likely a physiologic response to
cholecystectomy.

Pancreas:  Unremarkable

Spleen: The spleen measures 12.0 by 5.0 by 7.5 cm and appears
unremarkable.

Adrenals/Urinary Tract: 1.8 cm right kidney upper pole simple
appearing cyst. The adrenal glands appear normal.

Stomach/Bowel: Unremarkable

Vascular/Lymphatic: Aortoiliac atherosclerotic vascular disease. No
pathologic adenopathy identified.

Other:  No supplemental non-categorized findings.

Musculoskeletal: Unremarkable
IMPRESSION: 1. The lesion of concern in the left hepatic lobe noted on recent CT
scan represents a 5 mm cyst. No worrisome focal hepatic lesions are
identified.
2. Mild hepatic steatosis.  Morphologic findings of cirrhosis.
3. Mild cardiomegaly.
4.  Aortic Atherosclerosis ([M7]-[M7]).

## 2019-09-10 MED ORDER — GADOBUTROL 1 MMOL/ML IV SOLN
7.0000 mL | Freq: Once | INTRAVENOUS | Status: AC | PRN
  Administered 2019-09-10: 7 mL via INTRAVENOUS

## 2019-09-11 LAB — HEPATITIS B CORE ANTIBODY, TOTAL: Hep B Core Total Ab: NONREACTIVE

## 2019-09-11 LAB — HEPATITIS B SURFACE ANTIGEN: Hepatitis B Surface Ag: NONREACTIVE

## 2019-09-14 NOTE — Progress Notes (Signed)
No evidence of Hep B infection. Patient doesn't have immunity to Hep B based on prior lab. She needs vaccination to Hep B.

## 2019-09-14 NOTE — Progress Notes (Signed)
Cirrhosis confirmed on MRI. The liver lesion on prior CT is a cyst. No worrisome focal hepatic lesions identified. We will plan to follow along with updated labs and imaging in 6 months.   Additionally, MRI did reveal mild cardiomegaly. This is chronic and has been present at least since 2016 (noted on prior imaging). She can follow-up with PCP on this.

## 2019-09-27 DIAGNOSIS — E119 Type 2 diabetes mellitus without complications: Secondary | ICD-10-CM | POA: Diagnosis not present

## 2019-09-27 DIAGNOSIS — I10 Essential (primary) hypertension: Secondary | ICD-10-CM | POA: Diagnosis not present

## 2019-10-04 DIAGNOSIS — M7121 Synovial cyst of popliteal space [Baker], right knee: Secondary | ICD-10-CM | POA: Diagnosis not present

## 2019-10-04 DIAGNOSIS — M170 Bilateral primary osteoarthritis of knee: Secondary | ICD-10-CM | POA: Diagnosis not present

## 2019-10-08 DIAGNOSIS — H353132 Nonexudative age-related macular degeneration, bilateral, intermediate dry stage: Secondary | ICD-10-CM | POA: Diagnosis not present

## 2019-10-08 DIAGNOSIS — E119 Type 2 diabetes mellitus without complications: Secondary | ICD-10-CM | POA: Diagnosis not present

## 2019-10-08 DIAGNOSIS — H35721 Serous detachment of retinal pigment epithelium, right eye: Secondary | ICD-10-CM | POA: Diagnosis not present

## 2019-10-08 DIAGNOSIS — H353211 Exudative age-related macular degeneration, right eye, with active choroidal neovascularization: Secondary | ICD-10-CM | POA: Diagnosis not present

## 2019-10-11 DIAGNOSIS — L4050 Arthropathic psoriasis, unspecified: Secondary | ICD-10-CM | POA: Diagnosis not present

## 2019-10-11 DIAGNOSIS — E1165 Type 2 diabetes mellitus with hyperglycemia: Secondary | ICD-10-CM | POA: Diagnosis not present

## 2019-10-11 DIAGNOSIS — Z789 Other specified health status: Secondary | ICD-10-CM | POA: Diagnosis not present

## 2019-10-11 DIAGNOSIS — Z299 Encounter for prophylactic measures, unspecified: Secondary | ICD-10-CM | POA: Diagnosis not present

## 2019-10-11 DIAGNOSIS — I10 Essential (primary) hypertension: Secondary | ICD-10-CM | POA: Diagnosis not present

## 2019-10-11 DIAGNOSIS — K7460 Unspecified cirrhosis of liver: Secondary | ICD-10-CM | POA: Diagnosis not present

## 2019-10-14 DIAGNOSIS — R768 Other specified abnormal immunological findings in serum: Secondary | ICD-10-CM | POA: Diagnosis not present

## 2019-10-14 DIAGNOSIS — M1009 Idiopathic gout, multiple sites: Secondary | ICD-10-CM | POA: Diagnosis not present

## 2019-10-14 DIAGNOSIS — L409 Psoriasis, unspecified: Secondary | ICD-10-CM | POA: Diagnosis not present

## 2019-10-14 DIAGNOSIS — M150 Primary generalized (osteo)arthritis: Secondary | ICD-10-CM | POA: Diagnosis not present

## 2019-10-14 DIAGNOSIS — M2550 Pain in unspecified joint: Secondary | ICD-10-CM | POA: Diagnosis not present

## 2019-10-30 NOTE — Patient Instructions (Signed)
ZEPLYNN SCHRIEBER  10/30/2019     @PREFPERIOPPHARMACY @   Your procedure is scheduled on  11/04/2019 .  Report to [facility] at  [phone]  A.M.  Call this number if you have problems the morning of surgery:  [phone]   Remember:  Follow the diet inistructions given to you by Dr Roseanne Kaufman office.                      Take these medicines the morning of surgery with A SIP OF WATER  Levothyroxine, metoprolol, zanaflex(if needed). DO NOT take any medications for diabetes the morning of your surgery.    Do not wear jewelry, make-up or nail polish.  Do not wear lotions, powders, or perfumes. Please wear deodorant and brush your teeth.  Do not shave 48 hours prior to surgery.  Men may shave face and neck.  Do not bring valuables to the hospital.  [facility] is not responsible for any belongings or valuables.  Contacts, dentures or bridgework may not be worn into surgery.  Leave your suitcase in the car.  After surgery it may be brought to your room.  For patients admitted to the hospital, discharge time will be determined by your treatment team.  Patients discharged the day of surgery will not be allowed to drive home.   Name and phone number of your driver:   family Special instructions:  DO NOT smoke the morning of your procedure.  Please read over the following fact sheets that you were given. Anesthesia Post-op Instructions and Care and Recovery After Surgery       Upper Endoscopy, Adult, Care After This sheet gives you information about how to care for yourself after your procedure. Your health care provider may also give you more specific instructions. If you have problems or questions, contact your health care provider. What can I expect after the procedure? After the procedure, it is common to have:  A sore throat.  Mild stomach pain or discomfort.  Bloating.  Nausea. Follow these instructions at home:   Follow instructions from your health care provider  about what to eat or drink after your procedure.  Return to your normal activities as told by your health care provider. Ask your health care provider what activities are safe for you.  Take over-the-counter and prescription medicines only as told by your health care provider.  Do not drive for 24 hours if you were given a sedative during your procedure.  Keep all follow-up visits as told by your health care provider. This is important. Contact a health care provider if you have:  A sore throat that lasts longer than one day.  Trouble swallowing. Get help right away if:  You vomit blood or your vomit looks like coffee grounds.  You have: ? A fever. ? Bloody, black, or tarry stools. ? A severe sore throat or you cannot swallow. ? Difficulty breathing. ? Severe pain in your chest or abdomen. Summary  After the procedure, it is common to have a sore throat, mild stomach discomfort, bloating, and nausea.  Do not drive for 24 hours if you were given a sedative during the procedure.  Follow instructions from your health care provider about what to eat or drink after your procedure.  Return to your normal activities as told by your health care provider. This information is not intended to replace advice given to you by your health care provider. Make  sure you discuss any questions you have with your health care provider. Document Revised: 01/02/2018 Document Reviewed: 12/11/2017 Elsevier Patient Education  [place].  Esophageal Dilatation Esophageal dilatation, also called esophageal dilation, is a procedure to widen or open (dilate) a blocked or narrowed part of the esophagus. The esophagus is the part of the body that moves food and liquid from the mouth to the stomach. You may need this procedure if:  You have a buildup of scar tissue in your esophagus that makes it difficult, painful, or impossible to swallow. This can be caused by gastroesophageal reflux disease  (GERD).  You have cancer of the esophagus.  There is a problem with how food moves through your esophagus. In some cases, you may need this procedure repeated at a later time to dilate the esophagus gradually. Tell a health care provider about:  Any allergies you have.  All medicines you are taking, including vitamins, herbs, eye drops, creams, and over-the-counter medicines.  Any problems you or family members have had with anesthetic medicines.  Any blood disorders you have.  Any surgeries you have had.  Any medical conditions you have.  Any antibiotic medicines you are required to take before dental procedures.  Whether you are pregnant or may be pregnant. What are the risks? Generally, this is a safe procedure. However, problems may occur, including:  Bleeding due to a tear in the lining of the esophagus.  A hole (perforation) in the esophagus. What happens before the procedure?  Follow instructions from your health care provider about eating or drinking restrictions.  Ask your health care provider about changing or stopping your regular medicines. This is especially important if you are taking diabetes medicines or blood thinners.  Plan to have someone take you home from the hospital or clinic.  Plan to have a responsible adult care for you for at least 24 hours after you leave the hospital or clinic. This is important. What happens during the procedure?  You may be given a medicine to help you relax (sedative).  A numbing medicine may be sprayed into the back of your throat, or you may gargle the medicine.  Your health care provider may perform the dilatation using various surgical instruments, such as: ? Simple dilators. This instrument is carefully placed in the esophagus to stretch it. ? Guided wire bougies. This involves using an endoscope to insert a wire into the esophagus. A dilator is passed over this wire to enlarge the esophagus. Then the wire is  removed. ? Balloon dilators. An endoscope with a small balloon at the end is inserted into the esophagus. The balloon is inflated to stretch the esophagus and open it up. The procedure may vary among health care providers and hospitals. What happens after the procedure?  Your blood pressure, heart rate, breathing rate, and blood oxygen level will be monitored until the medicines you were given have worn off.  Your throat may feel slightly sore and numb. This will improve slowly over time.  You will not be allowed to eat or drink until your throat is no longer numb.  When you are able to drink, urinate, and sit on the edge of the bed without nausea or dizziness, you may be able to return home. Follow these instructions at home:  Take over-the-counter and prescription medicines only as told by your health care provider.  Do not drive for 24 hours if you were given a sedative during your procedure.  You should have a  responsible adult with you for 24 hours after the procedure.  Follow instructions from your health care provider about any eating or drinking restrictions.  Do not use any products that contain nicotine or tobacco, such as cigarettes and e-cigarettes. If you need help quitting, ask your health care provider.  Keep all follow-up visits as told by your health care provider. This is important. Get help right away if you:  Have a fever.  Have chest pain.  Have pain that is not relieved by medication.  Have trouble breathing.  Have trouble swallowing.  Vomit blood. Summary  Esophageal dilatation, also called esophageal dilation, is a procedure to widen or open (dilate) a blocked or narrowed part of the esophagus.  Plan to have someone take you home from the hospital or clinic.  For this procedure, a numbing medicine may be sprayed into the back of your throat, or you may gargle the medicine.  Do not drive for 24 hours if you were given a sedative during your  procedure. This information is not intended to replace advice given to you by your health care provider. Make sure you discuss any questions you have with your health care provider. Document Revised: 05/08/2019 Document Reviewed: 05/16/2017 Elsevier Patient Education  2020 [place] After These instructions provide you with information about caring for yourself after your procedure. Your health care provider may also give you more specific instructions. Your treatment has been planned according to current medical practices, but problems sometimes occur. Call your health care provider if you have any problems or questions after your procedure. What can I expect after the procedure? After your procedure, you may:  Feel sleepy for several hours.  Feel clumsy and have poor balance for several hours.  Feel forgetful about what happened after the procedure.  Have poor judgment for several hours.  Feel nauseous or vomit.  Have a sore throat if you had a breathing tube during the procedure. Follow these instructions at home: For at least 24 hours after the procedure:      Have a responsible adult stay with you. It is important to have someone help care for you until you are awake and alert.  Rest as needed.  Do not: ? Participate in activities in which you could fall or become injured. ? Drive. ? Use heavy machinery. ? Drink alcohol. ? Take sleeping pills or medicines that cause drowsiness. ? Make important decisions or sign legal documents. ? Take care of children on your own. Eating and drinking  Follow the diet that is recommended by your health care provider.  If you vomit, drink water, juice, or soup when you can drink without vomiting.  Make sure you have little or no nausea before eating solid foods. General instructions  Take over-the-counter and prescription medicines only as told by your health care provider.  If you have sleep  apnea, surgery and certain medicines can increase your risk for breathing problems. Follow instructions from your health care provider about wearing your sleep device: ? Anytime you are sleeping, including during daytime naps. ? While taking prescription pain medicines, sleeping medicines, or medicines that make you drowsy.  If you smoke, do not smoke without supervision.  Keep all follow-up visits as told by your health care provider. This is important. Contact a health care provider if:  You keep feeling nauseous or you keep vomiting.  You feel light-headed.  You develop a rash.  You have a fever. Get help right  away if:  You have trouble breathing. Summary  For several hours after your procedure, you may feel sleepy and have poor judgment.  Have a responsible adult stay with you for at least 24 hours or until you are awake and alert. This information is not intended to replace advice given to you by your health care provider. Make sure you discuss any questions you have with your health care provider. Document Revised: 10/09/2017 Document Reviewed: 11/01/2015 Elsevier Patient Education  [place].

## 2019-10-31 ENCOUNTER — Encounter (HOSPITAL_COMMUNITY): Payer: Self-pay

## 2019-10-31 ENCOUNTER — Other Ambulatory Visit (HOSPITAL_COMMUNITY)
Admission: RE | Admit: 2019-10-31 | Discharge: 2019-10-31 | Disposition: A | Discharge status: Home / Self Care | Payer: Medicare Other | Source: Ambulatory Visit | Attending: Internal Medicine | Admitting: Internal Medicine

## 2019-10-31 ENCOUNTER — Encounter (HOSPITAL_COMMUNITY)
Admission: RE | Admit: 2019-10-31 | Discharge: 2019-10-31 | Disposition: A | Discharge status: Home / Self Care | Payer: Medicare Other | Source: Ambulatory Visit | Attending: Internal Medicine | Admitting: Internal Medicine

## 2019-10-31 ENCOUNTER — Other Ambulatory Visit: Payer: Self-pay

## 2019-10-31 DIAGNOSIS — Z01818 Encounter for other preprocedural examination: Secondary | ICD-10-CM | POA: Diagnosis not present

## 2019-10-31 DIAGNOSIS — Z20822 Contact with and (suspected) exposure to covid-19: Secondary | ICD-10-CM | POA: Diagnosis not present

## 2019-10-31 HISTORY — DX: Unspecified macular degeneration: H35.30

## 2019-10-31 LAB — COMPREHENSIVE METABOLIC PANEL
ALT: 28 U/L (ref 0–44)
AST: 57 U/L — ABNORMAL HIGH (ref 15–41)
Albumin: 3.8 g/dL (ref 3.5–5.0)
Alkaline Phosphatase: 98 U/L (ref 38–126)
Anion gap: 10 (ref 5–15)
BUN: 12 mg/dL (ref 8–23)
CO2: 19 mmol/L — ABNORMAL LOW (ref 22–32)
Calcium: 8.5 mg/dL — ABNORMAL LOW (ref 8.9–10.3)
Chloride: 103 mmol/L (ref 98–111)
Creatinine, Ser: 1.02 mg/dL — ABNORMAL HIGH (ref 0.44–1.00)
GFR calc Af Amer: >60 mL/min (ref >60)
GFR calc non Af Amer: 58 mL/min — ABNORMAL LOW (ref >60)
Glucose, Bld: 221 mg/dL — ABNORMAL HIGH (ref 70–99)
Potassium: 5.7 mmol/L — ABNORMAL HIGH (ref 3.5–5.1)
Sodium: 132 mmol/L — ABNORMAL LOW (ref 135–145)
Total Bilirubin: 0.4 mg/dL (ref 0.3–1.2)
Total Protein: 6.5 g/dL (ref 6.5–8.1)

## 2019-10-31 LAB — PROTIME-INR
INR: 1 (ref 0.8–1.2)
Prothrombin Time: 13.4 seconds (ref 11.4–15.2)

## 2019-10-31 LAB — CBC WITH DIFFERENTIAL/PLATELET
Abs Immature Granulocytes: 0.01 10*3/uL (ref 0.00–0.07)
Basophils Absolute: 0 10*3/uL (ref 0.0–0.1)
Basophils Relative: 0 %
Eosinophils Absolute: 0 10*3/uL (ref 0.0–0.5)
Eosinophils Relative: 0 %
HCT: 37.2 % (ref 36.0–46.0)
Hemoglobin: 11.7 g/dL — ABNORMAL LOW (ref 12.0–15.0)
Immature Granulocytes: 0 %
Lymphocytes Relative: 30 %
Lymphs Abs: 1.5 10*3/uL (ref 0.7–4.0)
MCH: 30.4 pg (ref 26.0–34.0)
MCHC: 31.5 g/dL (ref 30.0–36.0)
MCV: 96.6 fL (ref 80.0–100.0)
Monocytes Absolute: 0.3 10*3/uL (ref 0.1–1.0)
Monocytes Relative: 5 %
Neutro Abs: 3.2 10*3/uL (ref 1.7–7.7)
Neutrophils Relative %: 65 %
Platelets: 170 10*3/uL (ref 150–400)
RBC: 3.85 MIL/uL — ABNORMAL LOW (ref 3.87–5.11)
RDW: 16.3 % — ABNORMAL HIGH (ref 11.5–15.5)
WBC: 5 10*3/uL (ref 4.0–10.5)
nRBC: 0 % (ref 0.0–0.2)

## 2019-11-01 ENCOUNTER — Other Ambulatory Visit (HOSPITAL_COMMUNITY): Payer: Medicare Other

## 2019-11-01 DIAGNOSIS — Z01818 Encounter for other preprocedural examination: Secondary | ICD-10-CM | POA: Diagnosis not present

## 2019-11-01 DIAGNOSIS — Z20822 Contact with and (suspected) exposure to covid-19: Secondary | ICD-10-CM | POA: Diagnosis not present

## 2019-11-01 LAB — BASIC METABOLIC PANEL
Anion gap: 10 (ref 5–15)
BUN: 14 mg/dL (ref 8–23)
CO2: 22 mmol/L (ref 22–32)
Calcium: 9 mg/dL (ref 8.9–10.3)
Chloride: 104 mmol/L (ref 98–111)
Creatinine, Ser: 1.05 mg/dL — ABNORMAL HIGH (ref 0.44–1.00)
GFR calc Af Amer: >60 mL/min (ref >60)
GFR calc non Af Amer: 56 mL/min — ABNORMAL LOW (ref >60)
Glucose, Bld: 120 mg/dL — ABNORMAL HIGH (ref 70–99)
Potassium: 4.8 mmol/L (ref 3.5–5.1)
Sodium: 136 mmol/L (ref 135–145)

## 2019-11-01 LAB — SARS CORONAVIRUS 2 (TAT 6-24 HRS): SARS Coronavirus 2: NEGATIVE

## 2019-11-03 NOTE — Progress Notes (Signed)
Several laboratory abnormalities. Follow-up BMP on 11/01/19 with resolution of hyperkaliemia, hyponatremia, and hypocalcemia. Glucose down from 221 to 120 on 4/9. She does have slightly bumped creatinine. She needs to be sure she is drinking plenty of water to keep her urine pale yellow to clear and will need to follow-up with PCP on this.   AST elevated at 57. This may be secondary to cirrhosis. Would recommend she stop her over the counter supplements, avoid alcohol, and avoid tylenol. Plan to recheck at next visit.

## 2019-11-04 ENCOUNTER — Ambulatory Visit (HOSPITAL_COMMUNITY)
Admission: RE | Admit: 2019-11-04 | Discharge: 2019-11-04 | Disposition: A | Discharge status: Home / Self Care | Payer: Medicare Other | Attending: Internal Medicine | Admitting: Internal Medicine

## 2019-11-04 ENCOUNTER — Ambulatory Visit (HOSPITAL_COMMUNITY): Payer: Medicare Other | Admitting: Anesthesiology

## 2019-11-04 ENCOUNTER — Encounter (HOSPITAL_COMMUNITY): Payer: Self-pay | Admitting: Internal Medicine

## 2019-11-04 ENCOUNTER — Other Ambulatory Visit: Payer: Self-pay

## 2019-11-04 ENCOUNTER — Encounter (HOSPITAL_COMMUNITY)
Admission: RE | Disposition: A | Discharge status: Home / Self Care | Payer: Self-pay | Source: Home / Self Care | Attending: Internal Medicine

## 2019-11-04 DIAGNOSIS — R002 Palpitations: Secondary | ICD-10-CM | POA: Diagnosis not present

## 2019-11-04 DIAGNOSIS — K228 Other specified diseases of esophagus: Secondary | ICD-10-CM

## 2019-11-04 DIAGNOSIS — E11319 Type 2 diabetes mellitus with unspecified diabetic retinopathy without macular edema: Secondary | ICD-10-CM | POA: Diagnosis not present

## 2019-11-04 DIAGNOSIS — J45909 Unspecified asthma, uncomplicated: Secondary | ICD-10-CM | POA: Diagnosis not present

## 2019-11-04 DIAGNOSIS — K319 Disease of stomach and duodenum, unspecified: Secondary | ICD-10-CM | POA: Insufficient documentation

## 2019-11-04 DIAGNOSIS — E119 Type 2 diabetes mellitus without complications: Secondary | ICD-10-CM | POA: Diagnosis not present

## 2019-11-04 DIAGNOSIS — Z880 Allergy status to penicillin: Secondary | ICD-10-CM | POA: Insufficient documentation

## 2019-11-04 DIAGNOSIS — Z9842 Cataract extraction status, left eye: Secondary | ICD-10-CM | POA: Diagnosis not present

## 2019-11-04 DIAGNOSIS — R1310 Dysphagia, unspecified: Secondary | ICD-10-CM | POA: Diagnosis not present

## 2019-11-04 DIAGNOSIS — I10 Essential (primary) hypertension: Secondary | ICD-10-CM | POA: Insufficient documentation

## 2019-11-04 DIAGNOSIS — E039 Hypothyroidism, unspecified: Secondary | ICD-10-CM | POA: Diagnosis not present

## 2019-11-04 DIAGNOSIS — L409 Psoriasis, unspecified: Secondary | ICD-10-CM | POA: Diagnosis not present

## 2019-11-04 DIAGNOSIS — D649 Anemia, unspecified: Secondary | ICD-10-CM | POA: Diagnosis not present

## 2019-11-04 DIAGNOSIS — Z9841 Cataract extraction status, right eye: Secondary | ICD-10-CM | POA: Insufficient documentation

## 2019-11-04 DIAGNOSIS — Z820 Family history of epilepsy and other diseases of the nervous system: Secondary | ICD-10-CM | POA: Diagnosis not present

## 2019-11-04 DIAGNOSIS — H353120 Nonexudative age-related macular degeneration, left eye, stage unspecified: Secondary | ICD-10-CM | POA: Insufficient documentation

## 2019-11-04 DIAGNOSIS — Z90710 Acquired absence of both cervix and uterus: Secondary | ICD-10-CM | POA: Diagnosis not present

## 2019-11-04 DIAGNOSIS — Z8249 Family history of ischemic heart disease and other diseases of the circulatory system: Secondary | ICD-10-CM | POA: Diagnosis not present

## 2019-11-04 DIAGNOSIS — Z888 Allergy status to other drugs, medicaments and biological substances status: Secondary | ICD-10-CM | POA: Diagnosis not present

## 2019-11-04 DIAGNOSIS — K7460 Unspecified cirrhosis of liver: Secondary | ICD-10-CM | POA: Diagnosis not present

## 2019-11-04 DIAGNOSIS — K3189 Other diseases of stomach and duodenum: Secondary | ICD-10-CM | POA: Diagnosis not present

## 2019-11-04 DIAGNOSIS — Z7984 Long term (current) use of oral hypoglycemic drugs: Secondary | ICD-10-CM | POA: Insufficient documentation

## 2019-11-04 DIAGNOSIS — H353210 Exudative age-related macular degeneration, right eye, stage unspecified: Secondary | ICD-10-CM | POA: Insufficient documentation

## 2019-11-04 DIAGNOSIS — M069 Rheumatoid arthritis, unspecified: Secondary | ICD-10-CM | POA: Insufficient documentation

## 2019-11-04 DIAGNOSIS — Z800 Family history of malignant neoplasm of digestive organs: Secondary | ICD-10-CM | POA: Insufficient documentation

## 2019-11-04 DIAGNOSIS — K219 Gastro-esophageal reflux disease without esophagitis: Secondary | ICD-10-CM | POA: Insufficient documentation

## 2019-11-04 DIAGNOSIS — Z79899 Other long term (current) drug therapy: Secondary | ICD-10-CM | POA: Diagnosis not present

## 2019-11-04 DIAGNOSIS — Z9049 Acquired absence of other specified parts of digestive tract: Secondary | ICD-10-CM | POA: Diagnosis not present

## 2019-11-04 DIAGNOSIS — K259 Gastric ulcer, unspecified as acute or chronic, without hemorrhage or perforation: Secondary | ICD-10-CM | POA: Diagnosis not present

## 2019-11-04 HISTORY — PX: MALONEY DILATION: SHX5535

## 2019-11-04 HISTORY — PX: BIOPSY: SHX5522

## 2019-11-04 HISTORY — PX: ESOPHAGOGASTRODUODENOSCOPY (EGD) WITH PROPOFOL: SHX5813

## 2019-11-04 LAB — GLUCOSE, CAPILLARY: Glucose-Capillary: 131 mg/dL — ABNORMAL HIGH (ref 70–99)

## 2019-11-04 SURGERY — ESOPHAGOGASTRODUODENOSCOPY (EGD) WITH PROPOFOL
Anesthesia: General

## 2019-11-04 MED ORDER — CHLORHEXIDINE GLUCONATE CLOTH 2 % EX PADS: 6.0000 | MEDICATED_PAD | Freq: Once | CUTANEOUS | Status: DC

## 2019-11-04 MED ORDER — KETAMINE HCL 50 MG/5ML IJ SOSY
PREFILLED_SYRINGE | INTRAMUSCULAR | Status: AC
  Filled 2019-11-04: qty 5

## 2019-11-04 MED ORDER — LIDOCAINE HCL (CARDIAC) PF 100 MG/5ML IV SOSY
PREFILLED_SYRINGE | INTRAVENOUS | Status: DC | PRN
  Administered 2019-11-04: 50 mg via INTRATRACHEAL

## 2019-11-04 MED ORDER — GLYCOPYRROLATE 0.2 MG/ML IJ SOLN
INTRAMUSCULAR | Status: DC | PRN
  Administered 2019-11-04: .1 mg via INTRAVENOUS

## 2019-11-04 MED ORDER — PROPOFOL 500 MG/50ML IV EMUL
INTRAVENOUS | Status: DC | PRN
  Administered 2019-11-04: 125 ug/kg/min via INTRAVENOUS

## 2019-11-04 MED ORDER — KETAMINE HCL 10 MG/ML IJ SOLN
INTRAMUSCULAR | Status: DC | PRN
  Administered 2019-11-04: 20 mg via INTRAVENOUS

## 2019-11-04 MED ORDER — LACTATED RINGERS IV SOLN: Freq: Once | INTRAVENOUS | Status: AC

## 2019-11-04 MED ORDER — PROPOFOL 10 MG/ML IV BOLUS
INTRAVENOUS | Status: DC | PRN
  Administered 2019-11-04: 20 mg via INTRAVENOUS

## 2019-11-04 MED ORDER — PROPOFOL 10 MG/ML IV BOLUS
INTRAVENOUS | Status: AC
  Filled 2019-11-04: qty 120

## 2019-11-04 NOTE — H&P (Signed)
@LOGO @   Primary Care Physician:  Glenda Chroman, MD Primary Gastroenterologist:  Dr. Gala Romney  Pre-Procedure History & Physical: HPI:  Kristine Rocha is a 64 y.o. female here for EGD with possible esophageal dilation due to dysphagia;.  Past Medical History:  Diagnosis Date  . Asthma   . Cirrhosis ([place])   . Diabetes ([place])   . Essential hypertension   . GERD (gastroesophageal reflux disease)   . History of GI bleed   . Hypothyroidism   . Macular degeneration    wet in right, dry in left, has injections for this.  . Microcytic anemia   . Palpitations   . Peptic ulcer disease   . Postural orthostatic tachycardia syndrome    Mildly orthostatic results tilt table test 2005  - Dr. Caryl Comes  . Psoriasis   . Rheumatoid arthritis [facility])     Past Surgical History:  Procedure Laterality Date  . ABDOMINAL HERNIA REPAIR  15 years ago   umbilical with mesh  . ABDOMINAL HYSTERECTOMY  20 years ago   . CATARACT EXTRACTION, BILATERAL Bilateral   . CHOLECYSTECTOMY    . COLONOSCOPY WITH ESOPHAGOGASTRODUODENOSCOPY (EGD)  03/2018   [facility]; indication:IDA; Normal exam.   . TILT TABLE STUDY  2005   Caryl Comes  . TRIGGER FINGER RELEASE Right    2nd finger    Prior to Admission medications   Medication Sig Start Date End Date Taking? Authorizing Provider  allopurinol (ZYLOPRIM) 300 MG tablet Take 300 mg by mouth every evening.    Yes [provider]  atorvastatin (LIPITOR) 10 MG tablet Take 10 mg by mouth every evening.    Yes [provider]  cetirizine (ZYRTEC) 10 MG tablet Take 10 mg by mouth daily.   Yes [provider]  Cyanocobalamin (VITAMIN B-12 PO) Take by mouth daily.   Yes [provider]  diphenhydrAMINE (BENADRYL) 25 MG tablet Take 25 mg by mouth at bedtime.   Yes [provider]  Iron, Ferrous Sulfate, 325 (65 Fe) MG TABS Take 325 mg by mouth daily.    Yes [provider]  levothyroxine (SYNTHROID) 88 MCG tablet Take 88 mcg by  mouth daily. 04/14/19  Yes [provider]  Melatonin 10 MG TABS Take 10 mg by mouth at bedtime.    Yes [provider]  metFORMIN (GLUCOPHAGE-XR) 500 MG 24 hr tablet Take 500 mg by mouth every evening.  03/04/17  Yes [provider]  metoprolol succinate (TOPROL-XL) 100 MG 24 hr tablet Take 100 mg by mouth daily. *Hold for low blood pressure*   Yes [provider]  Multiple Vitamins-Minerals (PRESERVISION AREDS 2 PO) Take 1 tablet by mouth 2 (two) times daily.   Yes [provider]  omeprazole (PRILOSEC) 40 MG capsule Take 1 capsule (40 mg total) by mouth 2 (two) times daily before a meal. 08/26/19  Yes Jodi Mourning, Kristen S, PA-C  tiZANidine (ZANAFLEX) 4 MG tablet Take 4 mg by mouth 3 (three) times daily as needed for muscle spasms.  06/14/19  Yes [provider]  VITAMIN E PO Take 1 capsule by mouth every evening.   Yes [provider]  [facility] VERIO test strip  12/06/16   [provider]  sucralfate (CARAFATE) 1 GM/10ML suspension Take 10 mLs (1 g total) by mouth 4 (four) times daily -  with meals and at bedtime. Patient not taking: Reported on 10/24/2019 08/26/19   Erenest Rasher, PA-C    Allergies as of 08/27/2019 - Review  Complete 08/26/2019  Allergen Reaction Noted  . Amoxicillin Hives, Shortness Of Breath, and Rash 11/19/2014  . Ampicillin Hives, Shortness Of Breath, and Rash 11/19/2014  . Other  11/19/2014  . Prevacid [lansoprazole] Diarrhea 06/24/2019    Family History  Problem Relation Age of Onset  . Heart attack Father        Age 33  . Arrhythmia Sister        WPW s/p ablation- corrected at [facility]  . Alzheimer's disease Mother   . Pneumonia Mother        Aspiration  . Heart attack Brother   . Heart attack Maternal Grandfather   . Colon cancer Maternal Aunt        diagnosed at 74    Social History   Socioeconomic History  . Marital status: Married    Spouse name: Not on file  . Number of children:  Not on file  . Years of education: Not on file  . Highest education level: Not on file  Occupational History  . Not on file  Tobacco Use  . Smoking status: Never Smoker  . Smokeless tobacco: Never Used  Substance and Sexual Activity  . Alcohol use: No    Alcohol/week: 0.0 standard drinks  . Drug use: No  . Sexual activity: Yes  Other Topics Concern  . Not on file  Social History Narrative  . Not on file   Social Determinants of Health   Financial Resource Strain:   . Difficulty of Paying Living Expenses:   Food Insecurity:   . Worried About Charity fundraiser in the Last Year:   . Arboriculturist in the Last Year:   Transportation Needs:   . Film/video editor (Medical):   Marland Kitchen Lack of Transportation (Non-Medical):   Physical Activity:   . Days of Exercise per Week:   . Minutes of Exercise per Session:   Stress:   . Feeling of Stress :   Social Connections:   . Frequency of Communication with Friends and Family:   . Frequency of Social Gatherings with Friends and Family:   . Attends Religious Services:   . Active Member of Clubs or Organizations:   . Attends Archivist Meetings:   Marland Kitchen Marital Status:   Intimate Partner Violence:   . Fear of Current or Ex-Partner:   . Emotionally Abused:   Marland Kitchen Physically Abused:   . Sexually Abused:     Review of Systems: See HPI, otherwise negative ROS  Physical Exam: BP (!) 106/50   Pulse (!) 56   Temp 97.6 F (36.4 C) (Oral)   Resp 11   Ht 5' 0.5" (1.537 m)   SpO2 97%   BMI 33.61 kg/m  General:   Alert,  Well-developed, well-nourished, pleasant and cooperative in NAD  Mouth:  No deformity or lesions. Neck:  Supple; no masses or thyromegaly. No significant cervical adenopathy. Lungs:  Clear throughout to auscultation.   No wheezes, crackles, or rhonchi. No acute distress. Heart:  Regular rate and rhythm; no murmurs, clicks, rubs,  or gallops. Abdomen: Non-distended, normal bowel sounds.  Soft and nontender  without appreciable mass or hepatosplenomegaly.  Pulses:  Normal pulses noted. Extremities:  Without clubbing or edema.  Impression/Plan: 64 year old lady with cirrhosis and dysphagia/GERD.  Here for EGD and possible esophageal dilation.  Declined previously recommended capsule.   The risks, benefits, limitations, alternatives and imponderables have been reviewed with the patient. Potential for esophageal dilation, biopsy, etc. have also been  reviewed.  Questions have been answered. All parties agreeable.     Notice: This dictation was prepared with Dragon dictation along with smaller phrase technology. Any transcriptional errors that result from this process are unintentional and may not be corrected upon review.

## 2019-11-04 NOTE — Anesthesia Preprocedure Evaluation (Addendum)
Anesthesia Evaluation  Patient identified by MRN, date of birth, ID band Patient awake    Reviewed: Allergy & Precautions, NPO status , Patient's Chart, lab work & pertinent test results  History of Anesthesia Complications Negative for: history of anesthetic complications  Airway Mallampati: II  TM Distance: >3 FB Neck ROM: Full   Comment:  Tongue hemangiomas Dental  (+) Lower Dentures, Upper Dentures   Pulmonary asthma ,    Pulmonary exam normal breath sounds clear to auscultation       Cardiovascular Exercise Tolerance: Good hypertension (POTS), Pt. on medications Normal cardiovascular exam Rhythm:Regular Rate:Normal  POTS 01-Nov-2019 11:41:36 [place]300 ROUTINE RECORD Normal sinus rhythm Normal ECG No previous tracing Confirmed by Kirk Ruths [phone]) on 11/01/2019 1:14:07 PM   Neuro/Psych negative neurological ROS  negative psych ROS   GI/Hepatic PUD, GERD  Medicated,(+) Cirrhosis       ,   Endo/Other  diabetes, Well Controlled, Type 2Hypothyroidism   Renal/GU   negative genitourinary   Musculoskeletal  (+) Arthritis , Osteoarthritis and Rheumatoid disorders,    Abdominal   Peds  Hematology  (+) anemia ,   Anesthesia Other Findings   Reproductive/Obstetrics                         Anesthesia Physical Anesthesia Plan  ASA: III  Anesthesia Plan: General   Post-op Pain Management:    Induction:   PONV Risk Score and Plan: 2 and Treatment may vary due to age or medical condition and TIVA  Airway Management Planned: Nasal Cannula, Natural Airway and Simple Face Mask  Additional Equipment:   Intra-op Plan:   Post-operative Plan:   Informed Consent: I have reviewed the patients History and Physical, chart, labs and discussed the procedure including the risks, benefits and alternatives for the proposed anesthesia with the patient or authorized  representative who has indicated his/her understanding and acceptance.     Dental advisory given  Plan Discussed with: CRNA  Anesthesia Plan Comments:        Anesthesia Quick Evaluation

## 2019-11-04 NOTE — Op Note (Signed)
[facility] Patient Name: Kristine Rocha Procedure Date: 11/04/2019 7:03 AM MRN: [id number] Date of Birth: 1955-08-22 Attending MD: Norvel Richards , MD CSN: [id number] Age: 64 Admit Type: Outpatient Procedure:                Upper GI endoscopy Indications:              Dysphagia Providers:                Norvel Richards, MD, Jeanann Lewandowsky. Sharon Seller, RN,                            Aram Candela Referring MD:              Medicines:                Propofol per Anesthesia Complications:            No immediate complications. Estimated Blood Loss:     Estimated blood loss was minimal. Procedure:                Pre-Anesthesia Assessment:                           - Prior to the procedure, a History and Physical                            was performed, and patient medications and                            allergies were reviewed. The patient's tolerance of                            previous anesthesia was also reviewed. The risks                            and benefits of the procedure and the sedation                            options and risks were discussed with the patient.                            All questions were answered, and informed consent                            was obtained. Prior Anticoagulants: The patient has                            taken no previous anticoagulant or antiplatelet                            agents. ASA Grade Assessment: II - A patient with                            mild systemic disease. After reviewing the risks  and benefits, the patient was deemed in                            satisfactory condition to undergo the procedure.                           After obtaining informed consent, the endoscope was                            passed under direct vision. Throughout the                            procedure, the patient's blood pressure, pulse, and                            oxygen saturations were monitored  continuously. The                            [id number]) scope was introduced through the                            mouth, and advanced to the second part of duodenum.                            The upper GI endoscopy was accomplished without                            difficulty. The patient tolerated the procedure                            well. Scope In: 7:39:38 AM Scope Out: 7:48:55 AM Total Procedure Duration: 0 hours 9 minutes 17 seconds  Findings:      The examined esophagus was normal.      Gastric mucosa somewhat ""plaquef" appearance in the antrum prepyloric       area. No ulcer or infiltrating process. No evidence of portal       gastropathy. Patent pylorus.      Prominent appearing villi in the second and third portion of the       duodenum. Somewhat lymphangitic appearance diffusely. The scope was       withdrawn. Dilation was performed with a Maloney dilator with mild       resistance at 74 Fr. The dilation site was examined following endoscope       reinsertion and showed no change. Estimated blood loss: none. Finally,       biopsies of the abnormal antrum and duodenum were taken for histologic       study. Impression:               - Normal esophagus. Dilated.                           -Abnormal appearing stomach and duodenum of                            uncertain significance. Status post biopsy. Moderate Sedation:  Moderate (conscious) sedation was personally administered by an       anesthesia professional. The following parameters were monitored: oxygen       saturation, heart rate, blood pressure, respiratory rate, EKG, adequacy       of pulmonary ventilation, and response to care. Recommendation:           - Patient has a contact number available for                            emergencies. The signs and symptoms of potential                            delayed complications were discussed with the                            patient. Return to normal  activities tomorrow.                            Written discharge instructions were provided to the                            patient.                           - Advance diet as tolerated. Continue present                            medications. Office visit with [place] in 3 weeks.                            Follow-up on pathology. Repeat EGD for variceal                            screening 2 years. Procedure Code(s):        --- Professional ---                           [id number], Esophagogastroduodenoscopy, flexible,                            transoral; diagnostic, including collection of                            specimen(s) by brushing or washing, when performed                            (separate procedure)                           43450, Dilation of esophagus, by unguided sound or                            bougie, single or multiple passes Diagnosis Code(s):        --- Professional ---  K22.8, Other specified diseases of esophagus                           R13.10, Dysphagia, unspecified CPT copyright 2019 American Medical Association. All rights reserved. The codes documented in this report are preliminary and upon coder review may  be revised to meet current compliance requirements. Cristopher Estimable. Macari Zalesky, MD Norvel Richards, MD 11/04/2019 8:00:34 AM This report has been signed electronically. Number of Addenda: 0

## 2019-11-04 NOTE — Anesthesia Postprocedure Evaluation (Signed)
Anesthesia Post Note  Patient: Kristine Rocha  Procedure(s) Performed: ESOPHAGOGASTRODUODENOSCOPY (EGD) WITH PROPOFOL (N/A ) MALONEY DILATION (N/A ) BIOPSY  Patient location during evaluation: PACU Anesthesia Type: General Level of consciousness: awake and alert Pain management: pain level controlled Vital Signs Assessment: post-procedure vital signs reviewed and stable Respiratory status: spontaneous breathing Cardiovascular status: stable Postop Assessment: no apparent nausea or vomiting Anesthetic complications: no     Last Vitals:  Vitals:   11/04/19 0710 11/04/19 0715  BP:  (!) 106/50  Pulse:    Resp: 20 11  Temp:    SpO2: 98% 97%    Last Pain:  Vitals:   11/04/19 0736  TempSrc:   PainSc: 0-No pain                 Eren Puebla Hristova

## 2019-11-04 NOTE — Transfer of Care (Signed)
Immediate Anesthesia Transfer of Care Note  Patient: Kristine Rocha  Procedure(s) Performed: ESOPHAGOGASTRODUODENOSCOPY (EGD) WITH PROPOFOL (N/A ) MALONEY DILATION (N/A ) BIOPSY  Patient Location: PACU  Anesthesia Type:General  Level of Consciousness: awake  Airway & Oxygen Therapy: Patient Spontanous Breathing  Post-op Assessment: Report given to RN and Post -op Vital signs reviewed and stable  Post vital signs: Reviewed and stable  Last Vitals:  Vitals Value Taken Time  BP    Temp    Pulse 72 11/04/19 0755  Resp 14 11/04/19 0755  SpO2 99 % 11/04/19 0755  Vitals shown include unvalidated device data.  Last Pain:  Vitals:   11/04/19 0736  TempSrc:   PainSc: 0-No pain         Complications: No apparent anesthesia complications

## 2019-11-04 NOTE — Discharge Instructions (Signed)
EGD Discharge instructions Please read the instructions outlined below and refer to this sheet in the next few weeks. These discharge instructions provide you with general information on caring for yourself after you leave the hospital. Your doctor may also give you specific instructions. While your treatment has been planned according to the most current medical practices available, unavoidable complications occasionally occur. If you have any problems or questions after discharge, please call your doctor. ACTIVITY  You may resume your regular activity but move at a slower pace for the next 24 hours.   Take frequent rest periods for the next 24 hours.   Walking will help expel (get rid of) the air and reduce the bloated feeling in your abdomen.   No driving for 24 hours (because of the anesthesia (medicine) used during the test).   You may shower.   Do not sign any important legal documents or operate any machinery for 24 hours (because of the anesthesia used during the test).  NUTRITION  Drink plenty of fluids.   You may resume your normal diet.   Begin with a light meal and progress to your normal diet.   Avoid alcoholic beverages for 24 hours or as instructed by your caregiver.  MEDICATIONS  You may resume your normal medications unless your caregiver tells you otherwise.  WHAT YOU CAN EXPECT TODAY  You may experience abdominal discomfort such as a feeling of fullness or "gas" pains.  FOLLOW-UP  Your doctor will discuss the results of your test with you.  SEEK IMMEDIATE MEDICAL ATTENTION IF ANY OF THE FOLLOWING OCCUR:  Excessive nausea (feeling sick to your stomach) and/or vomiting.   Severe abdominal pain and distention (swelling).   Trouble swallowing.   Temperature over 101 F (37.8 C).   Rectal bleeding or vomiting of blood.   Your esophagus was dilated today biopsies of your stomach and duodenum were taken  Further recommendations to follow pending review of  pathology report  Repeat EGD in 2 years to check for esophageal varices  Office visit with [place] [facility]) in 3 to 4 weeks  At patient request, I called Belenda Cruise at [phone] and reviewed results

## 2019-11-05 ENCOUNTER — Encounter: Payer: Self-pay | Admitting: Internal Medicine

## 2019-11-05 ENCOUNTER — Other Ambulatory Visit: Payer: Self-pay

## 2019-11-05 LAB — SURGICAL PATHOLOGY

## 2019-11-11 DIAGNOSIS — Z7189 Other specified counseling: Secondary | ICD-10-CM | POA: Diagnosis not present

## 2019-11-11 DIAGNOSIS — Z299 Encounter for prophylactic measures, unspecified: Secondary | ICD-10-CM | POA: Diagnosis not present

## 2019-11-11 DIAGNOSIS — E559 Vitamin D deficiency, unspecified: Secondary | ICD-10-CM | POA: Diagnosis not present

## 2019-11-11 DIAGNOSIS — Z79899 Other long term (current) drug therapy: Secondary | ICD-10-CM | POA: Diagnosis not present

## 2019-11-11 DIAGNOSIS — E039 Hypothyroidism, unspecified: Secondary | ICD-10-CM | POA: Diagnosis not present

## 2019-11-11 DIAGNOSIS — I10 Essential (primary) hypertension: Secondary | ICD-10-CM | POA: Diagnosis not present

## 2019-11-11 DIAGNOSIS — E7800 Pure hypercholesterolemia, unspecified: Secondary | ICD-10-CM | POA: Diagnosis not present

## 2019-11-11 DIAGNOSIS — Z0000 Encounter for general adult medical examination without abnormal findings: Secondary | ICD-10-CM | POA: Diagnosis not present

## 2019-11-11 DIAGNOSIS — D649 Anemia, unspecified: Secondary | ICD-10-CM | POA: Diagnosis not present

## 2019-11-11 DIAGNOSIS — Z1211 Encounter for screening for malignant neoplasm of colon: Secondary | ICD-10-CM | POA: Diagnosis not present

## 2019-11-14 DIAGNOSIS — I10 Essential (primary) hypertension: Secondary | ICD-10-CM | POA: Diagnosis not present

## 2019-11-14 DIAGNOSIS — E119 Type 2 diabetes mellitus without complications: Secondary | ICD-10-CM | POA: Diagnosis not present

## 2019-11-27 NOTE — Progress Notes (Signed)
Referring Provider: Glenda Chroman, MD Primary Care Physician:  Glenda Chroman, MD Primary GI Physician: Dr. Gala Romney  Chief Complaint  Patient presents with  . Gastroesophageal Reflux    doing okay    HPI:   Kristine Rocha is a 64 y.o. female presenting today with a history of IDA, GERD, dyspepsia, dysphagia, and newly diagnosed cirrhosis on CT in December 2020. TCS and EGD in September 2019 at [facility] [place] was normal.   Last seen in office 08/26/2019 to discuss rescheduling EGD with Givens capsule placement for IDA.  She declined given capsule but was agreeable to EGD.  Had recently been admitted to the hospital in December for epigastric pain with increased nausea and vomiting.  Labs a CT unrevealing.  CT did incidentally note cirrhosis.  She was discharged on Carafate and advised to continue omeprazole 40 mg daily.  On Carafate, she states she felt 100% better.  She had run out of Carafate and had return of epigastric burning.  Reflux improved but still had symptoms a couple times a week.  Mild nausea without vomiting.  Continued with dysphagia couple times a week.  Regarding cirrhosis, no history of alcohol or drug use. Father had liver disease secondary to alcohol.  Brother and sister both had hepatitis C.  Patient with personal history of hypothyroidism, psoriasis, and RA.  No other known liver diseases in the family.  Plans to proceed with EGD +/-dilation, no Givens capsule per patient's request, increase omeprazole to 40 mg twice daily, refill Carafate as she felt this helped significantly, complete additional laboratory work-up for cirrhosis.   Labs completed 08/26/2019: LFTs within normal limits.  INR normal.  Meld 7.  Hep C nonreactive. Immune to hep A. 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 Autoimmune work-up with ANA positive.  AMA, ASMA, immunoglobulins, ceruloplasmin, alpha-1 antitrypsin within normal limits.  AFP elevated  at 10.7.  Follow-up MRI with liver protocol 09/10/2019: Prior liver lesion seen on CT was compatible with a cyst.  No worrisome features.  Morphologic findings consistent with cirrhosis.  Also with mild cardiomegaly.  Plan to update labs in 6 months.  EGD 11/04/2019: Esophagus appeared normal s/p empiric dilation.  Abnormal appearing stomach of uncertain significance.  Gastric mucosa with some plaque appearance in the antrum and prepyloric area.  No ulcer or infiltrating process.  No portal gastropathy.  Patent pylorus.  Abnormal duodenum of uncertain significance.  Prominent villi in the second third portion of the duodenum.  Somewhat lymphangitic appearance diffusely.  Biopsies were taken of the gastric and duodenal mucosa.  Needs repeat EGD in 2 years for variceal screening.  Duodenal biopsy was benign.  Gastric biopsy with reactive gastropathy with erosions, no H. Pylori.  Today:   IDA: No blood in the stool or black stools. Reports hemoglobin was 11.4 with Dr. Woody Seller recently.  Not taking iron daily. Hasn't taken it in a couple of week. Before that it was hit or miss. Dr. Woody Seller did not update iron panel. Wants to hold off on Givens capsule. Wants to take an OTC iron tablet rather than Rx.   Dysphagia: Resolved.   GERD: Omeprazole BID. Much better/resolved.   Dyspepsia: Resolved. No nausea or vomiting.   Cirrhosis: Had labs with PCP for wellness visit. States LFTs were normal. Has not completed Hep B vaccine. No swelling in abdomen or LE. No yellowing of eyes or skin, dark urine. Bruises easily. No bleeding.  No change in mental status.   Bowels are moving daily. No constipation or diarrhea. Taking metamucil capsules if needed. Iron will constipate her.   Has completed COVID vaccine series.   Using ketoconazole/triamcinolone cream for rash under her right breast.     Past Medical History:  Diagnosis Date  . Asthma   . Cirrhosis ([place])   . Diabetes ([place])   . Essential hypertension   . GERD  (gastroesophageal reflux disease)   . History of GI bleed   . Hypothyroidism   . Macular degeneration    wet in right, dry in left, has injections for this.  . Microcytic anemia   . Palpitations   . Peptic ulcer disease   . Postural orthostatic tachycardia syndrome    Mildly orthostatic results tilt table test 2005  - Dr. Caryl Comes  . Psoriasis   . Rheumatoid arthritis [facility])     Past Surgical History:  Procedure Laterality Date  . ABDOMINAL HERNIA REPAIR  15 years ago   umbilical with mesh  . ABDOMINAL HYSTERECTOMY  20 years ago   . BIOPSY  11/04/2019   Procedure: BIOPSY;  Surgeon: Daneil Dolin, MD;  Location: [facility];  Service: Endoscopy;;  duodenum gastric  . CATARACT EXTRACTION, BILATERAL Bilateral   . CHOLECYSTECTOMY    . COLONOSCOPY WITH ESOPHAGOGASTRODUODENOSCOPY (EGD)  03/2018   [facility]; indication:IDA; Normal exam.   . ESOPHAGOGASTRODUODENOSCOPY (EGD) WITH PROPOFOL N/A 11/04/2019   Procedure: ESOPHAGOGASTRODUODENOSCOPY (EGD) WITH PROPOFOL;  Surgeon: Daneil Dolin, MD; normal esophagus s/p empiric dilation, abnormal appearing gastric mucosa s/p biopsy, abnormal duodenal mucosa s/p biopsied, no ulcer, infiltrating process, or portal gastropathy.  Duodenal biopsies benign.  Gastric biopsy with reactive gastropathy with erosions, no H. pylori.   Marland Kitchen MALONEY DILATION N/A 11/04/2019   Procedure: Venia Minks DILATION;  Surgeon: Daneil Dolin, MD;  Location: [facility];  Service: Endoscopy;  Laterality: N/A;  . TILT TABLE STUDY  2005   [place]  . TRIGGER FINGER RELEASE Right    2nd finger    Current Outpatient Medications  Medication Sig Dispense Refill  . allopurinol (ZYLOPRIM) 300 MG tablet Take 300 mg by mouth every evening.     Marland Kitchen atorvastatin (LIPITOR) 10 MG tablet Take 10 mg by mouth every evening.     . cetirizine (ZYRTEC) 10 MG tablet Take 10 mg by mouth daily.    . Cyanocobalamin (VITAMIN B-12 PO) Take by mouth daily.    . diphenhydrAMINE (BENADRYL) 25 MG  tablet Take 25 mg by mouth at bedtime.    . Iron, Ferrous Sulfate, 325 (65 Fe) MG TABS Take 325 mg by mouth daily.     Marland Kitchen levothyroxine (SYNTHROID) 88 MCG tablet Take 88 mcg by mouth daily.    . Melatonin 10 MG TABS Take 10 mg by mouth at bedtime.     . metFORMIN (GLUCOPHAGE-XR) 500 MG 24 hr tablet Take 500 mg by mouth every evening.   0  . metoprolol succinate (TOPROL-XL) 100 MG 24 hr tablet Take 100 mg by mouth daily. *Hold for low blood pressure*    . Multiple Vitamins-Minerals (PRESERVISION AREDS 2 PO) Take 1 tablet by mouth 2 (two) times daily.    Marland Kitchen omeprazole (PRILOSEC) 40 MG capsule Take 1 capsule (40 mg total) by mouth 2 (two) times daily before a meal. 60 capsule 3  . ONETOUCH VERIO test strip   0  . tiZANidine (ZANAFLEX) 4 MG tablet Take 4 mg by mouth 3 (three) times daily as needed  for muscle spasms.     . Vitamin D, Ergocalciferol, (DRISDOL) 1.25 MG (50000 UNIT) CAPS capsule Take 50,000 Units by mouth every 7 (seven) days.    Marland Kitchen VITAMIN E PO Take 1 capsule by mouth every evening.     No current facility-administered medications for this visit.    Allergies as of 11/28/2019 - Review Complete 11/28/2019  Allergen Reaction Noted  . Amoxicillin Hives, Shortness Of Breath, and Rash 11/19/2014  . Ampicillin Hives, Shortness Of Breath, and Rash 11/19/2014  . Other  11/19/2014  . Prevacid [lansoprazole] Diarrhea 06/24/2019    Family History  Problem Relation Age of Onset  . Heart attack Father        Age 14  . Arrhythmia Sister        WPW s/p ablation- corrected at [facility]  . Alzheimer's disease Mother   . Pneumonia Mother        Aspiration  . Heart attack Brother   . Heart attack Maternal Grandfather   . Colon cancer Maternal Aunt        diagnosed at 54    Social History   Socioeconomic History  . Marital status: Married    Spouse name: Not on file  . Number of children: Not on file  . Years of education: Not on file  . Highest education level: Not on file    Occupational History  . Not on file  Tobacco Use  . Smoking status: Never Smoker  . Smokeless tobacco: Never Used  Substance and Sexual Activity  . Alcohol use: No    Alcohol/week: 0.0 standard drinks  . Drug use: No  . Sexual activity: Yes  Other Topics Concern  . Not on file  Social History Narrative  . Not on file   Social Determinants of Health   Financial Resource Strain:   . Difficulty of Paying Living Expenses:   Food Insecurity:   . Worried About Charity fundraiser in the Last Year:   . Arboriculturist in the Last Year:   Transportation Needs:   . Film/video editor (Medical):   Marland Kitchen Lack of Transportation (Non-Medical):   Physical Activity:   . Days of Exercise per Week:   . Minutes of Exercise per Session:   Stress:   . Feeling of Stress :   Social Connections:   . Frequency of Communication with Friends and Family:   . Frequency of Social Gatherings with Friends and Family:   . Attends Religious Services:   . Active Member of Clubs or Organizations:   . Attends Archivist Meetings:   Marland Kitchen Marital Status:     Review of Systems: Gen: Denies fever, chills, lightheadedness, dizziness, presyncope, syncope. CV: Denies chest pain, palpitations Resp: Denies dyspnea at rest or cough. GI: See HPI Derm: Denies rash Psych: Denies depression, anxiety Heme: See HPI  Physical Exam: BP (!) 146/84   Pulse 86   Temp (!) 97 F (36.1 C) (Oral)   Ht 5' (1.524 m)   Wt 174 lb (78.9 kg)   BMI 33.98 kg/m  General:   Alert and oriented. No distress noted. Pleasant and cooperative.  Head:  Normocephalic and atraumatic. Eyes:  Conjuctiva clear without scleral icterus. Heart:  S1, S2 present without murmurs appreciated. Lungs:  Clear to auscultation bilaterally. No wheezes, rales, or rhonchi. No distress.  Abdomen:  +BS, soft, non-tender and non-distended. No rebound or guarding. No HSM or masses noted. Msk:  Symmetrical without gross deformities. Normal  posture. Extremities:  Without edema. Neurologic:  Alert and  oriented x4 Psych: Normal mood and affect.

## 2019-11-28 ENCOUNTER — Ambulatory Visit: Payer: Medicare Other | Admitting: Gastroenterology

## 2019-11-28 ENCOUNTER — Encounter: Payer: Self-pay | Admitting: Gastroenterology

## 2019-11-28 ENCOUNTER — Other Ambulatory Visit: Payer: Self-pay

## 2019-11-28 VITALS — BP 146/84 | HR 86 | Temp 97.0°F | Ht 60.0 in | Wt 174.0 lb

## 2019-11-28 DIAGNOSIS — K219 Gastro-esophageal reflux disease without esophagitis: Secondary | ICD-10-CM

## 2019-11-28 DIAGNOSIS — D509 Iron deficiency anemia, unspecified: Secondary | ICD-10-CM

## 2019-11-28 DIAGNOSIS — K7460 Unspecified cirrhosis of liver: Secondary | ICD-10-CM

## 2019-11-28 DIAGNOSIS — R1310 Dysphagia, unspecified: Secondary | ICD-10-CM | POA: Diagnosis not present

## 2019-11-28 DIAGNOSIS — R1013 Epigastric pain: Secondary | ICD-10-CM | POA: Diagnosis not present

## 2019-11-28 NOTE — Patient Instructions (Signed)
Please have hepatitis B vaccine completed.  I will provide you with a prescription today.  Please have CBC and iron panel updated in 4 weeks.   You will be due for updated labs and ultrasound in October 2021.  Our staff will help arrange this.  Resume taking iron every day.  You may take an over-the-counter iron if you prefer.  Be sure there is at least 325 mg of ferrous sulfate per dose.  Continue taking omeprazole 40 mg 30 minutes before breakfast and dinner.  Continue following a GERD diet.  Avoid fried, fatty, greasy, spicy, citrus foods.  Avoid caffeine and carbonated beverages.  Do not eat within 3 hours of laying down.  Continue to avoid all NSAIDs including ibuprofen, Aleve, Advil, and Goody powders.  No more than 2 g of Tylenol in 24 hours.  Be sure you are following a low-sodium diet.  No more than 2 g daily.  Monitor for any swelling in your abdomen, lower extremities, yellowing of the eyes or skin, or changes in mental status and let us know if this occurs.  We will follow up with you in October 2021.  Call with questions or concerns prior.  Aliene Altes, PA-C [facility]

## 2019-11-28 NOTE — Assessment & Plan Note (Addendum)
Well-controlled on omeprazole 40 mg twice daily.  No alarm symptoms.    Continue omeprazole 40 mg twice daily 30 minutes before breakfast and dinner. Continue following a GERD diet.  Counseled on this. Follow-up in October 2021.

## 2019-11-28 NOTE — Assessment & Plan Note (Signed)
Resolved.  EGD 11/04/2019 with normal-appearing esophagus s/p empiric dilation.  GERD is also now well controlled omeprazole 40 mg twice daily.  Plan to continue current medications, monitor for return of dysphagia, and follow-up in October 2021.

## 2019-11-28 NOTE — Assessment & Plan Note (Addendum)
Patient noted to have iron deficiency anemia in 2019.  She underwent colonoscopy and EGD at [facility] in September 2019 which was normal.  She was found to have hemoglobin 8.8 in October 2020 with ferritin of 11.  She denies any significant lower GI symptoms, bright red blood per rectum, or melena, but she was having significant upper GI symptoms with dyspepsia, nausea, GERD, and dysphagia.  Denies NSAIDs.  She was started on omeprazole daily in November 2020, increased to twice daily February 2021. Had initially planned for repeat EGD with Givens capsule but patient declined Givens capsule.  EGD 11/04/2019 with normal esophagus, abnormal appearing gastric mucosa s/p biopsy, abnormal duodenal mucosa s/p biopsied, no ulcer or infiltrating process identified, no portal gastropathy.  Duodenal biopsy was benign.  Gastric biopsy with reactive gastropathy with erosions, no H. pylori.  At this time, she is essentially asymptomatic from a GI standpoint and continues on omeprazole twice daily. Most recent hemoglobin 11.7 on 4/8.  Patient reports hemoglobin 11.4 recently with PCP.  Iron panel not updated.  Patient states she has not been taking iron regularly.  Again states she does not want to have Givens capsule at this time which I feel is reasonable as hemoglobin seems stable.  Etiology of IDA has not been identified.  It is possible that she had more extensive gastric erosions or possible PUD as omeprazole was increased to twice daily 2 months prior to EGD.  We will continue to monitor for now.  Advised patient to resume iron daily.  She prefers OTC iron rather than prescription. Plan to recheck iron panel and CBC in 4 weeks. Continue omeprazole 40 mg twice daily. Continue to avoid all NSAIDs. Follow-up October 2021.

## 2019-11-28 NOTE — Assessment & Plan Note (Signed)
Resolved.  Suspect this is likely secondary to gastritis.  Currently on omeprazole 40 mg twice daily which is also controlling her GERD symptoms very well.  She denies NSAID use.  Continue omeprazole 40 mg twice daily 30 minutes before breakfast and dinner. Continue following a GERD diet.  Counseled on this. Continue to avoid NSAIDs. Follow-up October 2021.

## 2019-11-28 NOTE — Assessment & Plan Note (Addendum)
Newly diagnosed cirrhosis 07/05/2019 via CT.  Meld 7 based on labs in April 2021.  Extensive evaluation unrevealing.  No evidence of hepatitis A, B, or C.  She is immune to hepatitis A.  No immunity to hepatitis B.  ANA positive but AMA, ASMA, immunoglobulins, ceruloplasmin, alpha-1 antitrypsin all within normal limits.  She does have history of psoriasis and rheumatoid arthritis which could cause elevated ANA.  She was found to have AFP elevated at 10.7.  Follow-up MRI with liver cyst without worrisome features, liver morphology consistent with cirrhosis.  She is without signs or symptoms of advanced liver disease today.  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  Advised to complete hepatitis B vaccine.  Prescription provided today. Follow low-sodium diet.  No more than 2 g daily. Continue to avoid alcohol. No more than 2 g of Tylenol in 24 hours.. Due for updated labs including CBC, CMP, INR, and AFP as well as ultrasound in October 2021.  We will have this arranged. Repeat EGD in April 2023. Advised to monitor for swelling in abdomen/ lower extremities, yellowing of the eyes or skin, or changes in mental status and let us know if this occurs. Follow-up in October 2021.

## 2019-12-03 ENCOUNTER — Encounter (INDEPENDENT_AMBULATORY_CARE_PROVIDER_SITE_OTHER): Payer: Medicare Other | Admitting: Ophthalmology

## 2019-12-03 DIAGNOSIS — H353113 Nonexudative age-related macular degeneration, right eye, advanced atrophic without subfoveal involvement: Secondary | ICD-10-CM | POA: Insufficient documentation

## 2019-12-03 DIAGNOSIS — H353211 Exudative age-related macular degeneration, right eye, with active choroidal neovascularization: Secondary | ICD-10-CM | POA: Insufficient documentation

## 2019-12-03 DIAGNOSIS — H353114 Nonexudative age-related macular degeneration, right eye, advanced atrophic with subfoveal involvement: Secondary | ICD-10-CM | POA: Insufficient documentation

## 2019-12-03 DIAGNOSIS — H35721 Serous detachment of retinal pigment epithelium, right eye: Secondary | ICD-10-CM | POA: Insufficient documentation

## 2019-12-03 DIAGNOSIS — H353132 Nonexudative age-related macular degeneration, bilateral, intermediate dry stage: Secondary | ICD-10-CM | POA: Insufficient documentation

## 2019-12-09 ENCOUNTER — Ambulatory Visit (INDEPENDENT_AMBULATORY_CARE_PROVIDER_SITE_OTHER): Payer: Medicare Other | Admitting: Ophthalmology

## 2019-12-09 ENCOUNTER — Other Ambulatory Visit: Payer: Self-pay

## 2019-12-09 ENCOUNTER — Encounter (INDEPENDENT_AMBULATORY_CARE_PROVIDER_SITE_OTHER): Payer: Self-pay | Admitting: Ophthalmology

## 2019-12-09 ENCOUNTER — Other Ambulatory Visit: Payer: Self-pay | Admitting: Emergency Medicine

## 2019-12-09 DIAGNOSIS — D509 Iron deficiency anemia, unspecified: Secondary | ICD-10-CM

## 2019-12-09 DIAGNOSIS — H353122 Nonexudative age-related macular degeneration, left eye, intermediate dry stage: Secondary | ICD-10-CM | POA: Diagnosis not present

## 2019-12-09 DIAGNOSIS — H43811 Vitreous degeneration, right eye: Secondary | ICD-10-CM | POA: Diagnosis not present

## 2019-12-09 DIAGNOSIS — H353114 Nonexudative age-related macular degeneration, right eye, advanced atrophic with subfoveal involvement: Secondary | ICD-10-CM

## 2019-12-09 DIAGNOSIS — K7460 Unspecified cirrhosis of liver: Secondary | ICD-10-CM

## 2019-12-09 DIAGNOSIS — H353211 Exudative age-related macular degeneration, right eye, with active choroidal neovascularization: Secondary | ICD-10-CM

## 2019-12-09 MED ORDER — BEVACIZUMAB CHEMO INJECTION 1.25MG/0.05ML SYRINGE FOR KALEIDOSCOPE
1.2500 mg | INTRAVITREAL | Status: AC | PRN
  Administered 2019-12-09: 1.25 mg via INTRAVITREAL

## 2019-12-09 NOTE — Progress Notes (Signed)
12/09/2019     CHIEF COMPLAINT Patient presents for Retina Follow Up   HISTORY OF PRESENT ILLNESS: Kristine Rocha is a 64 y.o. 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  Duration of 9 weeks.  Since onset it is stable.          Comments    9 week follow up- OCT OU, Possible Avastin OD Patient denies change in vision and overall has no complaints. LBS 134 this AM A1C 6.25 October 2019       Last edited by Gerda Diss on 12/09/2019 10:46 AM. (History)      Referring physician: Glenda Chroman, MD [place],  [place] [id number]  HISTORICAL INFORMATION:   Selected notes from the [facility]: No current outpatient medications on file. (Ophthalmic Drugs)   No current facility-administered medications for this visit. (Ophthalmic Drugs)   Current Outpatient Medications (Other)  Medication Sig  . allopurinol (ZYLOPRIM) 300 MG tablet Take 300 mg by mouth every evening.   Marland Kitchen atorvastatin (LIPITOR) 10 MG tablet Take 10 mg by mouth every evening.   . cetirizine (ZYRTEC) 10 MG tablet Take 10 mg by mouth daily.  . Cyanocobalamin (VITAMIN B-12 PO) Take by mouth daily.  . diphenhydrAMINE (BENADRYL) 25 MG tablet Take 25 mg by mouth at bedtime.  . Iron, Ferrous Sulfate, 325 (65 Fe) MG TABS Take 325 mg by mouth daily.   Marland Kitchen levothyroxine (SYNTHROID) 88 MCG tablet Take 88 mcg by mouth daily.  . Melatonin 10 MG TABS Take 10 mg by mouth at bedtime.   . metFORMIN (GLUCOPHAGE-XR) 500 MG 24 hr tablet Take 500 mg by mouth every evening.   . metoprolol succinate (TOPROL-XL) 100 MG 24 hr tablet Take 100 mg by mouth daily. *Hold for low blood pressure*  . Multiple Vitamins-Minerals (PRESERVISION AREDS 2 PO) Take 1 tablet by mouth 2 (two) times daily.  Marland Kitchen omeprazole (PRILOSEC) 40 MG capsule Take 1 capsule (40 mg total) by mouth 2 (two) times daily before a meal.  . ONETOUCH VERIO test  strip   . tiZANidine (ZANAFLEX) 4 MG tablet Take 4 mg by mouth 3 (three) times daily as needed for muscle spasms.   . Vitamin D, Ergocalciferol, (DRISDOL) 1.25 MG (50000 UNIT) CAPS capsule Take 50,000 Units by mouth every 7 (seven) days.  Marland Kitchen VITAMIN E PO Take 1 capsule by mouth every evening.   No current facility-administered medications for this visit. (Other)      REVIEW OF SYSTEMS:    ALLERGIES Allergies  Allergen Reactions  . Amoxicillin Hives, Shortness Of Breath and Rash    Did it involve swelling of the face/tongue/throat, SOB, or low BP? Yes Did it involve sudden or severe rash/hives, skin peeling, or any reaction on the inside of your mouth or nose? Unknown Did you need to seek medical attention at a hospital or doctor's office? Yes When did it last happen?~15-20 years ago If all above answers are "NO", may proceed with cephalosporin use. .   . Ampicillin Hives, Shortness Of Breath and Rash    Did it involve swelling of the face/tongue/throat, SOB, or low BP? Yes Did it involve sudden or severe rash/hives, skin peeling, or any reaction on the inside of your mouth or nose? Unknown Did you need to seek medical attention at  a hospital or doctor's office? Yes When did it last happen?~15-20 years ago If all above answers are "NO", may proceed with cephalosporin use. .  . Other     NON-STEROIDAL ANTI-INFLAMMATORY DRUG CAUSED GASTRIC BLEED  . Prevacid [Lansoprazole] Diarrhea    PAST MEDICAL HISTORY Past Medical History:  Diagnosis Date  . Asthma   . Cirrhosis ([place])   . Diabetes ([place])   . Essential hypertension   . GERD (gastroesophageal reflux disease)   . History of GI bleed   . Hypothyroidism   . Macular degeneration    wet in right, dry in left, has injections for this.  . Microcytic anemia   . Palpitations   . Peptic ulcer disease   . Postural orthostatic tachycardia syndrome    [place]ildly orthostatic results tilt table test 2005  - Dr. Caryl Comes  .  Psoriasis   . Rheumatoid arthritis [place])    Past Surgical History:  Procedure Laterality Date  . ABDOMINAL HERNIA REPAIR  15 years ago   umbilical with mesh  . ABDOMINAL HYSTERECTOMY  20 years ago   . BIOPSY  11/04/2019   Procedure: BIOPSY;  Surgeon: Daneil Dolin, MD;  Location: [facility];  Service: Endoscopy;;  duodenum gastric  . CATARACT EXTRACTION, BILATERAL Bilateral   . CHOLECYSTECTOMY    . COLONOSCOPY WITH ESOPHAGOGASTRODUODENOSCOPY (EGD)  03/2018   [facility]; indication:IDA; Normal exam.   . ESOPHAGOGASTRODUODENOSCOPY (EGD) WITH PROPOFOL N/A 11/04/2019   Procedure: ESOPHAGOGASTRODUODENOSCOPY (EGD) WITH PROPOFOL;  Surgeon: Daneil Dolin, MD; normal esophagus s/p empiric dilation, abnormal appearing gastric mucosa s/p biopsy, abnormal duodenal mucosa s/p biopsied, no ulcer, infiltrating process, or portal gastropathy.  Duodenal biopsies benign.  Gastric biopsy with reactive gastropathy with erosions, no H. pylori.   Marland Kitchen MALONEY DILATION N/A 11/04/2019   Procedure: Venia Minks DILATION;  Surgeon: Daneil Dolin, MD;  Location: [facility];  Service: Endoscopy;  Laterality: N/A;  . TILT TABLE STUDY  2005   [facility]  . TRIGGER FINGER RELEASE Right    2nd finger    FAMILY HISTORY Family History  Problem Relation Age of Onset  . Heart attack Father        Age 43  . Arrhythmia Sister        WPW s/p ablation- corrected at [facility]  . Alzheimer's disease Mother   . Pneumonia Mother        Aspiration  . Heart attack Brother   . Heart attack Maternal Grandfather   . Colon cancer Maternal Aunt        diagnosed at 27    SOCIAL HISTORY Social History   Tobacco Use  . Smoking status: Never Smoker  . Smokeless tobacco: Never Used  Substance Use Topics  . Alcohol use: No    Alcohol/week: 0.0 standard drinks  . Drug use: No         OPHTHALMIC EXAM: Base Eye Exam    Visual Acuity (Snellen - Linear)      Right Left   Dist [place] 20/60+2 20/20   Dist ph [place] NI         Tonometry (Tonopen, 10:50 AM)      Right Left   Pressure 13 9       Pupils      Pupils Dark Light Shape React APD   Right PERRL 5 4 Round Brisk None   Left PERRL 5 4 Round Brisk None       Visual Fields (Counting fingers)  Left Right    Full Full       Extraocular Movement      Right Left    Full Full       Neuro/Psych    Oriented x3: Yes   Mood/Affect: Normal       Dilation    Right eye: 1.0% Mydriacyl, 2.5% Phenylephrine @ 10:50 AM        Slit Lamp and Fundus Exam    External Exam      Right Left   External Normal Normal       Slit Lamp Exam      Right Left   Lids/Lashes Normal Normal   Conjunctiva/Sclera White and quiet White and quiet   Cornea Clear Clear   Anterior Chamber Deep and quiet Deep and quiet   Iris Round and reactive Round and reactive   Lens Posterior chamber intraocular lens Posterior chamber intraocular lens   Vitreous Normal Normal          IMAGING AND PROCEDURES  Imaging and Procedures for 12/09/19  OCT, Retina - OU - Both Eyes       Right Eye Quality was good. Scan locations included subfoveal. Central Foveal Thickness: 173.   Left Eye Quality was good. Scan locations included subfoveal. Central Foveal Thickness: 275.                 ASSESSMENT/PLAN:  No problem-specific Assessment & Plan notes found for this encounter.    No diagnosis found.  1.  Wet ARMD OD, improved, now stable and quiesced sent disease on intravitreal Avastin, 8-week interval.  Return visit in 10 weeks possible Avastin OD  2.  3.  Ophthalmic Meds Ordered this visit:  No orders of the defined types were placed in this encounter.      No follow-ups on file.  There are no Patient Instructions on file for this visit.   Explained the diagnoses, plan, and follow up with the patient and they expressed understanding.  Patient expressed understanding of the importance of proper follow up care.   Clent Demark Jannah Guardiola M.D. Diseases &  Surgery of the Retina and Vitreous Retina & Diabetic [place] 12/09/19     Abbreviations: M myopia (nearsighted); A astigmatism; H hyperopia (farsighted); P presbyopia; Mrx spectacle prescription;  CTL contact lenses; OD right eye; OS left eye; OU both eyes  XT exotropia; ET esotropia; PEK punctate epithelial keratitis; PEE punctate epithelial erosions; DES dry eye syndrome; MGD meibomian gland dysfunction; ATs artificial tears; PFAT's preservative free artificial tears; [place] nuclear sclerotic cataract; PSC posterior subcapsular cataract; ERM epi-retinal membrane; PVD posterior vitreous detachment; RD retinal detachment; DM diabetes mellitus; DR diabetic retinopathy; NPDR non-proliferative diabetic retinopathy; PDR proliferative diabetic retinopathy; CSME clinically significant macular edema; DME diabetic macular edema; dbh dot blot hemorrhages; CWS cotton wool spot; POAG primary open angle glaucoma; C/D cup-to-disc ratio; HVF humphrey visual field; GVF goldmann visual field; OCT optical coherence tomography; IOP intraocular pressure; BRVO Branch retinal vein occlusion; CRVO central retinal vein occlusion; CRAO central retinal artery occlusion; BRAO branch retinal artery occlusion; RT retinal tear; SB scleral buckle; PPV pars plana vitrectomy; VH Vitreous hemorrhage; PRP panretinal laser photocoagulation; IVK intravitreal kenalog; VMT vitreomacular traction; MH Macular hole;  NVD neovascularization of the disc; NVE neovascularization elsewhere; AREDS age related eye disease study; ARMD age related macular degeneration; POAG primary open angle glaucoma; EBMD epithelial/anterior basement membrane dystrophy; ACIOL anterior chamber intraocular lens; IOL intraocular lens; PCIOL posterior chamber intraocular lens; Phaco/IOL phacoemulsification with intraocular lens  placement; [place] photorefractive keratectomy; LASIK laser assisted in situ keratomileusis; HTN hypertension; DM diabetes mellitus; COPD chronic  obstructive pulmonary disease

## 2019-12-09 NOTE — Assessment & Plan Note (Signed)

## 2019-12-22 DIAGNOSIS — I10 Essential (primary) hypertension: Secondary | ICD-10-CM | POA: Diagnosis not present

## 2019-12-22 DIAGNOSIS — E119 Type 2 diabetes mellitus without complications: Secondary | ICD-10-CM | POA: Diagnosis not present

## 2019-12-24 DIAGNOSIS — L4050 Arthropathic psoriasis, unspecified: Secondary | ICD-10-CM | POA: Diagnosis not present

## 2019-12-24 DIAGNOSIS — Z299 Encounter for prophylactic measures, unspecified: Secondary | ICD-10-CM | POA: Diagnosis not present

## 2019-12-24 DIAGNOSIS — H353211 Exudative age-related macular degeneration, right eye, with active choroidal neovascularization: Secondary | ICD-10-CM | POA: Diagnosis not present

## 2019-12-24 DIAGNOSIS — J209 Acute bronchitis, unspecified: Secondary | ICD-10-CM | POA: Diagnosis not present

## 2019-12-24 DIAGNOSIS — K7460 Unspecified cirrhosis of liver: Secondary | ICD-10-CM | POA: Diagnosis not present

## 2020-01-01 DIAGNOSIS — J22 Unspecified acute lower respiratory infection: Secondary | ICD-10-CM | POA: Diagnosis not present

## 2020-01-01 DIAGNOSIS — D649 Anemia, unspecified: Secondary | ICD-10-CM | POA: Diagnosis not present

## 2020-01-01 DIAGNOSIS — R05 Cough: Secondary | ICD-10-CM | POA: Diagnosis not present

## 2020-01-01 DIAGNOSIS — K7460 Unspecified cirrhosis of liver: Secondary | ICD-10-CM | POA: Diagnosis not present

## 2020-01-01 DIAGNOSIS — J984 Other disorders of lung: Secondary | ICD-10-CM | POA: Diagnosis not present

## 2020-01-01 DIAGNOSIS — Z299 Encounter for prophylactic measures, unspecified: Secondary | ICD-10-CM | POA: Diagnosis not present

## 2020-01-08 ENCOUNTER — Telehealth: Payer: Self-pay

## 2020-01-08 DIAGNOSIS — K219 Gastro-esophageal reflux disease without esophagitis: Secondary | ICD-10-CM

## 2020-01-08 MED ORDER — OMEPRAZOLE 40 MG PO CPDR: 40.0000 mg | DELAYED_RELEASE_CAPSULE | Freq: Two times a day (BID) | ORAL | 3 refills | Status: DC

## 2020-01-08 NOTE — Telephone Encounter (Signed)
rx done

## 2020-01-08 NOTE — Telephone Encounter (Signed)
Refill request received from [facility], [place], [place] for Omeprazole 40 mg, I capsule by po bid before a meal.

## 2020-01-08 NOTE — Addendum Note (Signed)
Addended by: Mahala Menghini on: 01/08/2020 02:28 PM   Modules accepted: Orders

## 2020-01-13 ENCOUNTER — Encounter: Payer: Self-pay | Admitting: Gastroenterology

## 2020-01-13 DIAGNOSIS — Z299 Encounter for prophylactic measures, unspecified: Secondary | ICD-10-CM | POA: Diagnosis not present

## 2020-01-13 DIAGNOSIS — R197 Diarrhea, unspecified: Secondary | ICD-10-CM | POA: Diagnosis not present

## 2020-01-13 DIAGNOSIS — K2900 Acute gastritis without bleeding: Secondary | ICD-10-CM | POA: Diagnosis not present

## 2020-01-13 DIAGNOSIS — K7460 Unspecified cirrhosis of liver: Secondary | ICD-10-CM | POA: Diagnosis not present

## 2020-01-13 LAB — PROTIME-INR

## 2020-01-19 ENCOUNTER — Telehealth: Payer: Self-pay | Admitting: Gastroenterology

## 2020-01-19 NOTE — Telephone Encounter (Signed)
Received and reviewed labs completed 01/13/2020.  AFP 6.4 (within normal limits) CMP: Glucose 108, creatinine 0.94, sodium 140, potassium 4.3, chloride 107, calcium 8.5, albumin 3.6, total bilirubin 0.4, alk phos 114, AST 28, ALT 22. INR 1.0 CBC: WBC 7.7, hemoglobin 11.6, platelets 127 Iron panel: Iron 90, percent saturation 26%, TIBC 346, ferritin 55  MELD Na- 6   Please let patient know I have reviewed recent labs. Overall labs look good/stable. Iron panel now within normal limits as well. Still somewhat on the low end of normal. Recommend she continue oral iron daily for now.  Patient was due for updated labs in October. We had placed orders for this. These can be pushed out to December 2020. We will follow-up in October as planned.

## 2020-01-20 NOTE — Telephone Encounter (Signed)
Lmom, waiting on a return call.  

## 2020-01-20 NOTE — Telephone Encounter (Signed)
Lmom cell, waiting on a return call.

## 2020-01-20 NOTE — Telephone Encounter (Signed)
Spoke with pt. Pt notified that labs were received and results. Pt wanted [place] to know that she was placed on Antibiotics (Levaquin & Z-pak) for Bronchitis and oral Prednisone x 2 weeks. Pt has been off both antibiotics and Prednisone x 2 weeks and is having watery diarrhea since she d/c the medications. Pt's PCP has prescribed Bentyl and Hyoscyamine. Pt feels the medication may help diarrhea for one day and the diarrhea returns 30 mins after pts eats. Pt wanted to know if there are is anything else she needed to take to help with the watery diarrhea. PCP did complete stool studies and cdiff was negative per pt. Pt is avoiding foods with oils ect.

## 2020-01-20 NOTE — Telephone Encounter (Signed)
How is she taking Bentyl and/or Hyoscyamine and what is the dosage? She should not be taking these medications together. Typically one or the other and dosed before meals.   How many Bms is she having a day? Are stools watery? Any abdominal pain, brbpr, or melena? Is she consuming dairy products?

## 2020-01-23 DIAGNOSIS — H353211 Exudative age-related macular degeneration, right eye, with active choroidal neovascularization: Secondary | ICD-10-CM | POA: Diagnosis not present

## 2020-01-23 DIAGNOSIS — Z299 Encounter for prophylactic measures, unspecified: Secondary | ICD-10-CM | POA: Diagnosis not present

## 2020-01-23 DIAGNOSIS — E039 Hypothyroidism, unspecified: Secondary | ICD-10-CM | POA: Diagnosis not present

## 2020-01-23 DIAGNOSIS — E1165 Type 2 diabetes mellitus with hyperglycemia: Secondary | ICD-10-CM | POA: Diagnosis not present

## 2020-01-23 DIAGNOSIS — L4050 Arthropathic psoriasis, unspecified: Secondary | ICD-10-CM | POA: Diagnosis not present

## 2020-01-23 DIAGNOSIS — I10 Essential (primary) hypertension: Secondary | ICD-10-CM | POA: Diagnosis not present

## 2020-01-23 NOTE — Telephone Encounter (Signed)
Mailed letter to pt

## 2020-01-23 NOTE — Telephone Encounter (Signed)
Lmom, waiting on a return call.  

## 2020-01-28 ENCOUNTER — Telehealth: Payer: Self-pay | Admitting: Internal Medicine

## 2020-01-28 NOTE — Telephone Encounter (Signed)
Lmom, waiting on a a return call.

## 2020-01-28 NOTE — Telephone Encounter (Signed)
Pt was returning call to AM for last week. [phone]

## 2020-01-29 NOTE — Telephone Encounter (Signed)
error 

## 2020-02-04 DIAGNOSIS — Z299 Encounter for prophylactic measures, unspecified: Secondary | ICD-10-CM | POA: Diagnosis not present

## 2020-02-04 DIAGNOSIS — J069 Acute upper respiratory infection, unspecified: Secondary | ICD-10-CM | POA: Diagnosis not present

## 2020-02-04 DIAGNOSIS — K7460 Unspecified cirrhosis of liver: Secondary | ICD-10-CM | POA: Diagnosis not present

## 2020-02-04 DIAGNOSIS — H353211 Exudative age-related macular degeneration, right eye, with active choroidal neovascularization: Secondary | ICD-10-CM | POA: Diagnosis not present

## 2020-02-04 DIAGNOSIS — E1165 Type 2 diabetes mellitus with hyperglycemia: Secondary | ICD-10-CM | POA: Diagnosis not present

## 2020-02-17 ENCOUNTER — Ambulatory Visit (INDEPENDENT_AMBULATORY_CARE_PROVIDER_SITE_OTHER): Payer: Medicare Other | Admitting: Ophthalmology

## 2020-02-17 ENCOUNTER — Other Ambulatory Visit: Payer: Self-pay

## 2020-02-17 ENCOUNTER — Encounter (INDEPENDENT_AMBULATORY_CARE_PROVIDER_SITE_OTHER): Payer: Self-pay | Admitting: Ophthalmology

## 2020-02-17 DIAGNOSIS — H353211 Exudative age-related macular degeneration, right eye, with active choroidal neovascularization: Secondary | ICD-10-CM | POA: Diagnosis not present

## 2020-02-17 MED ORDER — BEVACIZUMAB CHEMO INJECTION 1.25MG/0.05ML SYRINGE FOR KALEIDOSCOPE
1.2500 mg | INTRAVITREAL | Status: AC | PRN
  Administered 2020-02-17: 1.25 mg via INTRAVITREAL

## 2020-02-17 NOTE — Assessment & Plan Note (Signed)
OD much improved and stable still at 10-week interval with much less subretinal fluid.  We will repeat today and examination in 3 months OU

## 2020-02-17 NOTE — Patient Instructions (Signed)
Instructed to contact the office for new onset visual acuity decline or distortion

## 2020-02-17 NOTE — Progress Notes (Signed)
02/17/2020     CHIEF COMPLAINT Patient presents for Retina Follow Up   HISTORY OF PRESENT ILLNESS: Kristine Rocha is a 64 y.o. 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  This started 10 weeks ago.  Severity is mild.  Duration of 10 weeks.  Since onset it is stable.          Comments    10 Week AMD F/U OD, poss Avastin OD  Pt denies noticeable changes to [place] OU since last visit. Pt denies ocular pain, flashes of light, or floaters OU.  A1c: 6.4, 01/2020       Last edited by Rockie Neighbours, [place] on 02/17/2020 10:24 AM. (History)      Referring physician: Glenda Chroman, MD [place],  [place] [address]  HISTORICAL INFORMATION:   Selected notes from the [place]: No current outpatient medications on file. (Ophthalmic Drugs)   No current facility-administered medications for this visit. (Ophthalmic Drugs)   Current Outpatient Medications (Other)  Medication Sig  . allopurinol (ZYLOPRIM) 300 MG tablet Take 300 mg by mouth every evening.   Marland Kitchen atorvastatin (LIPITOR) 10 MG tablet Take 10 mg by mouth every evening.   . cetirizine (ZYRTEC) 10 MG tablet Take 10 mg by mouth daily.  . Cyanocobalamin (VITAMIN B-12 PO) Take by mouth daily.  . diphenhydrAMINE (BENADRYL) 25 MG tablet Take 25 mg by mouth at bedtime.  . Iron, Ferrous Sulfate, 325 (65 Fe) MG TABS Take 325 mg by mouth daily.   Marland Kitchen levothyroxine (SYNTHROID) 88 MCG tablet Take 88 mcg by mouth daily.  . Melatonin 10 MG TABS Take 10 mg by mouth at bedtime.   . metFORMIN (GLUCOPHAGE-XR) 500 MG 24 hr tablet Take 500 mg by mouth every evening.   . metoprolol succinate (TOPROL-XL) 100 MG 24 hr tablet Take 100 mg by mouth daily. *Hold for low blood pressure*  . Multiple Vitamins-Minerals (PRESERVISION AREDS 2 PO) Take 1 tablet by mouth 2 (two) times daily.  Marland Kitchen omeprazole (PRILOSEC) 40 MG capsule Take 1 capsule  (40 mg total) by mouth 2 (two) times daily before a meal.  . ONETOUCH VERIO test strip   . tiZANidine (ZANAFLEX) 4 MG tablet Take 4 mg by mouth 3 (three) times daily as needed for muscle spasms.   . Vitamin D, Ergocalciferol, (DRISDOL) 1.25 MG (50000 UNIT) CAPS capsule Take 50,000 Units by mouth every 7 (seven) days.  Marland Kitchen VITAMIN E PO Take 1 capsule by mouth every evening.   No current facility-administered medications for this visit. (Other)      REVIEW OF SYSTEMS:    ALLERGIES Allergies  Allergen Reactions  . Amoxicillin Hives, Shortness Of Breath and Rash    Did it involve swelling of the face/tongue/throat, SOB, or low BP? Yes Did it involve sudden or severe rash/hives, skin peeling, or any reaction on the inside of your mouth or nose? Unknown Did you need to seek medical attention at a hospital or doctor's office? Yes When did it last happen?~15-20 years ago If all above answers are "NO", may proceed with cephalosporin use. .   . Ampicillin Hives, Shortness Of Breath and Rash    Did it involve swelling of the face/tongue/throat, SOB, or low BP? Yes Did it involve sudden or severe rash/hives, skin peeling, or any reaction  on the inside of your mouth or nose? Unknown Did you need to seek medical attention at a hospital or doctor's office? Yes When did it last happen?~15-20 years ago If all above answers are "NO", may proceed with cephalosporin use. .  . Other     NON-STEROIDAL ANTI-INFLAMMATORY DRUG CAUSED GASTRIC BLEED  . Prevacid [Lansoprazole] Diarrhea    PAST MEDICAL HISTORY Past Medical History:  Diagnosis Date  . Asthma   . Cirrhosis ([place])   . Diabetes ([place])   . Essential hypertension   . GERD (gastroesophageal reflux disease)   . History of GI bleed   . Hypothyroidism   . Macular degeneration    wet in right, dry in left, has injections for this.  . Microcytic anemia   . Palpitations   . Peptic ulcer disease   . Postural orthostatic tachycardia  syndrome    Mildly orthostatic results tilt table test 2005  - Dr. Caryl Comes  . Psoriasis   . Rheumatoid arthritis [facility])    Past Surgical History:  Procedure Laterality Date  . ABDOMINAL HERNIA REPAIR  15 years ago   umbilical with mesh  . ABDOMINAL HYSTERECTOMY  20 years ago   . BIOPSY  11/04/2019   Procedure: BIOPSY;  Surgeon: Daneil Dolin, MD;  [facility];  Service: Endoscopy;;  duodenum gastric  . CATARACT EXTRACTION, BILATERAL Bilateral   . CHOLECYSTECTOMY    . COLONOSCOPY WITH ESOPHAGOGASTRODUODENOSCOPY (EGD)  03/2018   [facility]; indication:IDA; Normal exam.   . ESOPHAGOGASTRODUODENOSCOPY (EGD) WITH PROPOFOL N/A 11/04/2019   Procedure: ESOPHAGOGASTRODUODENOSCOPY (EGD) WITH PROPOFOL;  Surgeon: Daneil Dolin, MD; normal esophagus s/p empiric dilation, abnormal appearing gastric mucosa s/p biopsy, abnormal duodenal mucosa s/p biopsied, no ulcer, infiltrating process, or portal gastropathy.  Duodenal biopsies benign.  Gastric biopsy with reactive gastropathy with erosions, no H. pylori.   Marland Kitchen MALONEY DILATION N/A 11/04/2019   Procedure: Venia Minks DILATION;  Surgeon: Daneil Dolin, MD;  [facility];  Service: Endoscopy;  Laterality: N/A;  . TILT TABLE STUDY  2005   [facility]  . TRIGGER FINGER RELEASE Right    2nd finger    FAMILY HISTORY Family History  Problem Relation Age of Onset  . Heart attack Father        Age 72  . Arrhythmia Sister        WPW s/p ablation- corrected at [facility]  . Alzheimer's disease Mother   . Pneumonia Mother        Aspiration  . Heart attack Brother   . Heart attack Maternal Grandfather   . Colon cancer Maternal Aunt        diagnosed at 8    SOCIAL HISTORY Social History   Tobacco Use  . Smoking status: Never Smoker  . Smokeless tobacco: Never Used  Vaping Use  . Vaping Use: Never used  Substance Use Topics  . Alcohol use: No    Alcohol/week: 0.0 standard drinks  . Drug use: No         OPHTHALMIC  EXAM:  Base Eye Exam    Visual Acuity (ETDRS)      Right Left   Dist [place] 20/60 -3 20/20   Dist ph [place] 20/60 -2        Tonometry (Tonopen, 10:26 AM)      Right Left   Pressure 15 13       Pupils      Pupils Dark Light Shape React APD   Right PERRL 3 2  Round Sluggish None   Left PERRL 3 2 Round Sluggish None       Visual Fields (Counting fingers)      Left Right    Full Full       Extraocular Movement      Right Left    Full Full       Neuro/Psych    Oriented x3: Yes   Mood/Affect: Normal       Dilation    Right eye: 1.0% Mydriacyl, 2.5% Phenylephrine @ 10:26 AM        Slit Lamp and Fundus Exam    External Exam      Right Left   External Normal Normal       Slit Lamp Exam      Right Left   Lids/Lashes Normal Normal   Conjunctiva/Sclera White and quiet White and quiet   Cornea Clear Clear   Anterior Chamber Deep and quiet Deep and quiet   Iris Round and reactive, TI defect 530-630 Round and reactive   Lens Posterior chamber intraocular lens Posterior chamber intraocular lens   Anterior Vitreous Normal Normal       Fundus Exam      Right Left   Posterior Vitreous Posterior vitreous detachment    Disc Normal    C/D Ratio 0.45    Macula Retinal pigment epithelial atrophy, Advanced age related macular degeneration, Hard drusen, no exudates, no hemorrhage, Mottling, Retinal pigment epithelial mottling, Geographic atrophy, Soft drusen           IMAGING AND PROCEDURES  Imaging and Procedures for 02/17/20  OCT, Retina - OU - Both Eyes       Right Eye Quality was good. Scan locations included subfoveal. Central Foveal Thickness: 181. Progression has improved. Findings include abnormal foveal contour, subretinal scarring, central retinal atrophy, outer retinal atrophy.   Left Eye Quality was good. Scan locations included subfoveal. Central Foveal Thickness: 273. Progression has been stable. Findings include abnormal foveal contour, retinal drusen .    Notes Much less subretinal fluid, visual acuity is on the Basis of outer retinal atrophy/       Intravitreal Injection, Pharmacologic Agent - OD - Right Eye       Time Out 02/17/2020. 11:17 AM. Confirmed correct patient, procedure, site, and patient consented.   Anesthesia Topical anesthesia was used. Anesthetic medications included [place] 3.5%.   Procedure Preparation included 10% betadine to eyelids, 5% betadine to ocular surface, Tobramycin 0.3%. A 30 gauge needle was used.   Injection:  1.25 mg Bevacizumab (AVASTIN) SOLN   NDC: [address]-6, Lot: [id number]   Route: Intravitreal, Site: Right Eye, Waste: 0 mg  Post-op Post injection exam found visual acuity of at least counting fingers. The patient tolerated the procedure well. There were no complications. The patient received written and verbal post procedure care education. Post injection medications were not given.                 ASSESSMENT/PLAN:  Exudative age-related macular degeneration of right eye with active choroidal neovascularization (HCC) OD much improved and stable still at 10-week interval with much less subretinal fluid.  We will repeat today and examination in 3 months OU      ICD-10-CM   1. Exudative age-related macular degeneration of right eye with active choroidal neovascularization (HCC)  H35.3211 OCT, Retina - OU - Both Eyes    Intravitreal Injection, Pharmacologic Agent - OD - Right Eye    Bevacizumab (AVASTIN) SOLN 1.25 mg  1.  2.  3.  Ophthalmic Meds Ordered this visit:  Meds ordered this encounter  Medications  . Bevacizumab (AVASTIN) SOLN 1.25 mg       Return in about 3 months (around 05/19/2020) for DILATE OU, AVASTIN OCT, OD.  Patient Instructions  Instructed to contact the office for new onset visual acuity decline or distortion    Explained the diagnoses, plan, and follow up with the patient and they expressed understanding.  Patient expressed understanding of the  importance of proper follow up care.   Clent Demark Kendle Erker M.D. Diseases & Surgery of the Retina and Vitreous Retina & Diabetic [place] 02/17/20     Abbreviations: M myopia (nearsighted); A astigmatism; H hyperopia (farsighted); P presbyopia; Mrx spectacle prescription;  CTL contact lenses; OD right eye; OS left eye; OU both eyes  XT exotropia; ET esotropia; PEK punctate epithelial keratitis; PEE punctate epithelial erosions; DES dry eye syndrome; MGD meibomian gland dysfunction; ATs artificial tears; PFAT's preservative free artificial tears; [place] nuclear sclerotic cataract; PSC posterior subcapsular cataract; ERM epi-retinal membrane; PVD posterior vitreous detachment; RD retinal detachment; DM diabetes mellitus; DR diabetic retinopathy; NPDR non-proliferative diabetic retinopathy; PDR proliferative diabetic retinopathy; CSME clinically significant macular edema; DME diabetic macular edema; dbh dot blot hemorrhages; CWS cotton wool spot; POAG primary open angle glaucoma; C/D cup-to-disc ratio; HVF humphrey visual field; GVF goldmann visual field; OCT optical coherence tomography; IOP intraocular pressure; BRVO Branch retinal vein occlusion; CRVO central retinal vein occlusion; CRAO central retinal artery occlusion; BRAO branch retinal artery occlusion; RT retinal tear; SB scleral buckle; PPV pars plana vitrectomy; VH Vitreous hemorrhage; PRP panretinal laser photocoagulation; IVK intravitreal kenalog; VMT vitreomacular traction; MH Macular hole;  NVD neovascularization of the disc; NVE neovascularization elsewhere; AREDS age related eye disease study; ARMD age related macular degeneration; POAG primary open angle glaucoma; EBMD epithelial/anterior basement membrane dystrophy; ACIOL anterior chamber intraocular lens; IOL intraocular lens; PCIOL posterior chamber intraocular lens; Phaco/IOL phacoemulsification with intraocular lens placement; [place] photorefractive keratectomy; LASIK laser assisted in situ  keratomileusis; HTN hypertension; DM diabetes mellitus; COPD chronic obstructive pulmonary disease

## 2020-02-24 DIAGNOSIS — M7121 Synovial cyst of popliteal space [Baker], right knee: Secondary | ICD-10-CM | POA: Diagnosis not present

## 2020-02-24 DIAGNOSIS — M1711 Unilateral primary osteoarthritis, right knee: Secondary | ICD-10-CM | POA: Diagnosis not present

## 2020-02-26 ENCOUNTER — Ambulatory Visit: Payer: Medicare Other | Admitting: Gastroenterology

## 2020-03-10 ENCOUNTER — Telehealth: Payer: Self-pay

## 2020-03-10 ENCOUNTER — Other Ambulatory Visit: Payer: Self-pay | Admitting: Gastroenterology

## 2020-03-10 DIAGNOSIS — K219 Gastro-esophageal reflux disease without esophagitis: Secondary | ICD-10-CM

## 2020-03-10 MED ORDER — OMEPRAZOLE 40 MG PO CPDR: 40.0000 mg | DELAYED_RELEASE_CAPSULE | Freq: Two times a day (BID) | ORAL | 11 refills | Status: DC

## 2020-03-10 NOTE — Telephone Encounter (Signed)
Pt returned call and would like Omeprazole 40 mg bid sent to [facility] mail order.

## 2020-03-10 NOTE — Telephone Encounter (Signed)
Received a refill request from [facility] for Omeprazole. Lmom, waiting on a return call. Would like to know if pt is going to get this medication through [facility] or through [facility] which pt previously got med at.

## 2020-03-10 NOTE — Telephone Encounter (Signed)
Rx sent 

## 2020-03-11 NOTE — Telephone Encounter (Signed)
Noted  

## 2020-04-01 ENCOUNTER — Telehealth: Payer: Self-pay | Admitting: Internal Medicine

## 2020-04-01 NOTE — Telephone Encounter (Signed)
Recall for ultrasound 

## 2020-04-01 NOTE — Telephone Encounter (Signed)
She is due for updated [place] due to cirrhosis noted on MRI in February. She can go ahead and complete this now.

## 2020-04-01 NOTE — Telephone Encounter (Signed)
Is patient needing any imaging prior to OV in October or will this be completed when she comes in?

## 2020-04-02 NOTE — Telephone Encounter (Signed)
Recall sent 

## 2020-04-14 DIAGNOSIS — R768 Other specified abnormal immunological findings in serum: Secondary | ICD-10-CM | POA: Diagnosis not present

## 2020-04-14 DIAGNOSIS — L409 Psoriasis, unspecified: Secondary | ICD-10-CM | POA: Diagnosis not present

## 2020-04-14 DIAGNOSIS — M2550 Pain in unspecified joint: Secondary | ICD-10-CM | POA: Diagnosis not present

## 2020-04-14 DIAGNOSIS — M150 Primary generalized (osteo)arthritis: Secondary | ICD-10-CM | POA: Diagnosis not present

## 2020-04-15 DIAGNOSIS — H353211 Exudative age-related macular degeneration, right eye, with active choroidal neovascularization: Secondary | ICD-10-CM | POA: Diagnosis not present

## 2020-04-15 DIAGNOSIS — H353122 Nonexudative age-related macular degeneration, left eye, intermediate dry stage: Secondary | ICD-10-CM | POA: Diagnosis not present

## 2020-04-15 DIAGNOSIS — Z961 Presence of intraocular lens: Secondary | ICD-10-CM | POA: Diagnosis not present

## 2020-04-15 DIAGNOSIS — E119 Type 2 diabetes mellitus without complications: Secondary | ICD-10-CM | POA: Diagnosis not present

## 2020-04-27 NOTE — Progress Notes (Signed)
Referring Provider: Glenda Chroman, MD Primary Care Physician:  Glenda Chroman, MD Primary GI Physician: Dr. Gala Romney  Chief Complaint  Patient presents with  . Gastroesophageal Reflux    doing fine.  . Cirrhosis    f/u  . Dysphagia    none as of now    HPI:   Kristine Rocha is a 64 y.o. female presenting for follow-up.  She has history of IDA, GERD, dyspepsia, dysphagia, and newly diagnosed well compensated cirrhosis on CT in December 2020. [facility]   Repeat EGD 11/04/2019 with normal-appearing esophagus s/p dilation, abnormal appearing stomach of uncertain significance, gastric mucosa with some plaque appearance in the antrum and prepyloric area, no ulcer or infiltrating process, no portal gastropathy, patent pylorus, abnormal duodenum of uncertain significance with prominent villi in the second and third portion of the duodenum s/p biopsy.  Duodenal biopsy was benign.  Gastric biopsy with reactive gastropathy with erosions, no H. pylori.  Recommended repeat EGD in 2 years.  Regarding cirrhosis, patient has no significant history of alcohol or drug use.  Viral markers with negative hep C, immune to hep a, not immune to hep B.  ANA positive.  AMA, ASMA, immunoglobulins, ceruloplasmin, alpha-1 antitrypsin all within normal limits.  AFP was elevated at 10.7 in February 2021.  Follow-up MRI with liver protocol revealed prior liver lesion on CT was compatible with a cyst, no worrisome features.  Morphology compatible with cirrhosis.  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   She was last seen in our office 11/28/2019.  She denied BRBPR or melena.  She is not taking iron.  Wanted to hold off on Giv[place] capsule and wanted to take over-the-counter iron rather than prescription.  Dysphagia had resolved with EGD in April.  GERD was well controlled on omeprazole twice daily.  No signs or symptoms of decompensated liver disease.  Had completed labs  with PCP for wellness visit.  Had not completed hep B vaccine.  She was advised to complete hep B vaccine, 2 g sodium diet, continue to avoid alcohol and limit Tylenol, continue omeprazole 40 mg twice daily, avoid NSAIDs, resume iron daily, update iron and CBC in 4 weeks.  We would request recent labs from PCP.  Received labs completed 01/13/2020.  AFP 6.4 (within normal limits), kidney function, electrolytes, LFTs, INR within normal limits.  Hemoglobin 11.6, platelets 127 (L).  Ferritin 55, iron 90, percent saturation 26%.  Labs correspond to MELD Na 6.  Advise she continue on oral iron daily and would follow-up in the office as planned.  When we call patient to give her results and recommendations on 6/28, she reported she had recently been on Levaquin Z-Pak, and prednisone for bronchitis and had watery diarrhea since discontinuing the medications.  PCP had prescribed Bentyl and hyoscyamine.  Patient reported PCP tested her for C. difficile which was negative.  She was asking for further recommendations.  Unfortunately, after nurse sent message to me, we were unable to get a hold of patient for additional information.  Today:   Cirrhosis: No swelling in abdomen or LE. No yellow of eyes. Bruises easily.   No confusion. No alcohol.   Hep B vaccine: Received this in May 2021.   GERD: No breakthrough symptoms. No abdominal pain. No dysphagia. No nausea or vomiting.   IDA: No BRBPR, melena, hematuria, nosebleeds, vaginal bleeding, or other obvious blood loss.  Taking beef liver and OTC iron supplement.  Stools are somewhat loose. Can have none to 5 BMs a day. When she is busy, she doesn't have as many BMs. No watery stools. No nocturnal stools. No brbpr or melena. Will take hyoscyamine as needed up to 3 times a day.  Typically only 1-2 a day.  Takes Bentyl at night if needed.   History of hypothyroidism. If eating ice cream or milk, her diarrhea will worsen. She tries to avoid these items.  Eats  cheese at times. Eats some fried foods.   Past Medical History:  Diagnosis Date  . Asthma   . Cirrhosis ([place]) 06/2019   Immune to Hep A. Has completed Hep B vaccine (May 2021)  . Diabetes ([place])   . Essential hypertension   . GERD (gastroesophageal reflux disease)   . History of GI bleed   . Hypothyroidism   . Macular degeneration    wet in right, dry in left, has injections for this.  . Microcytic anemia   . Palpitations   . Peptic ulcer disease   . Postural orthostatic tachycardia syndrome    Mildly orthostatic results tilt table test 2005  - Dr. Caryl Comes  . Psoriasis   . Rheumatoid arthritis [facility])     Past Surgical History:  Procedure Laterality Date  . ABDOMINAL HERNIA REPAIR  15 years ago   umbilical with mesh  . ABDOMINAL HYSTERECTOMY  20 years ago   . BIOPSY  11/04/2019   Procedure: BIOPSY;  Surgeon: Daneil Dolin, MD;  [facility];  Service: Endoscopy;;  duodenum gastric  . CATARACT EXTRACTION, BILATERAL Bilateral   . CHOLECYSTECTOMY    . COLONOSCOPY WITH ESOPHAGOGASTRODUODENOSCOPY (EGD)  03/2018   [facility]; indication:IDA; Normal exam.   . ESOPHAGOGASTRODUODENOSCOPY (EGD) WITH PROPOFOL N/A 11/04/2019   Procedure: ESOPHAGOGASTRODUODENOSCOPY (EGD) WITH PROPOFOL;  Surgeon: Daneil Dolin, MD; normal esophagus s/p empiric dilation, abnormal appearing gastric mucosa s/p biopsy, abnormal duodenal mucosa s/p biopsied, no ulcer, infiltrating process, or portal gastropathy.  Duodenal biopsies benign.  Gastric biopsy with reactive gastropathy with erosions, no H. pylori.   Marland Kitchen MALONEY DILATION N/A 11/04/2019   Procedure: Venia Minks DILATION;  Surgeon: Daneil Dolin, MD;  [facility];  Service: Endoscopy;  Laterality: N/A;  . TILT TABLE STUDY  2005   [facility]  . TRIGGER FINGER RELEASE Right    2nd finger    Current Outpatient Medications  Medication Sig Dispense Refill  . allopurinol (ZYLOPRIM) 300 MG tablet Take 300 mg by mouth every evening.     Marland Kitchen  atorvastatin (LIPITOR) 10 MG tablet Take 10 mg by mouth every evening.     . cetirizine (ZYRTEC) 10 MG tablet Take 10 mg by mouth daily.    . Cyanocobalamin (VITAMIN B-12 PO) Take by mouth daily.    . diphenhydrAMINE (BENADRYL) 25 MG tablet Take 25 mg by mouth at bedtime. As needed    . hyoscyamine (LEVSIN SL) 0.125 MG SL tablet Place under the tongue 3 (three) times daily as needed.    Marland Kitchen levothyroxine (SYNTHROID) 88 MCG tablet Take 88 mcg by mouth daily.    . Melatonin 10 MG TABS Take 10 mg by mouth at bedtime.     . metFORMIN (GLUCOPHAGE-XR) 500 MG 24 hr tablet Take 500 mg by mouth every evening.   0  . metoprolol succinate (TOPROL-XL) 100 MG 24 hr tablet Take 100 mg by mouth daily. *Hold for low blood pressure*    . Multiple Vitamins-Minerals (PRESERVISION AREDS 2 PO) Take 1 tablet by  mouth 2 (two) times daily.    Marland Kitchen omeprazole (PRILOSEC) 40 MG capsule Take 1 capsule (40 mg total) by mouth 2 (two) times daily before a meal. 60 capsule 11  . ONETOUCH VERIO test strip   0  . OVER THE COUNTER MEDICATION OTC Iron daily    . tiZANidine (ZANAFLEX) 4 MG tablet Take 4 mg by mouth 3 (three) times daily as needed for muscle spasms.     . Vitamin D, Ergocalciferol, (DRISDOL) 1.25 MG (50000 UNIT) CAPS capsule Take 50,000 Units by mouth every 7 (seven) days.    Marland Kitchen VITAMIN E PO Take 1 capsule by mouth every evening.     No current facility-administered medications for this visit.    Allergies as of 04/29/2020 - Review Complete 04/29/2020  Allergen Reaction Noted  . Amoxicillin Hives, Shortness Of Breath, and Rash 11/19/2014  . Ampicillin Hives, Shortness Of Breath, and Rash 11/19/2014  . Other  11/19/2014  . Prevacid [lansoprazole] Diarrhea 06/24/2019    Family History  Problem Relation Age of Onset  . Heart attack Father        Age 65  . Arrhythmia Sister        WPW s/p ablation- corrected at [facility]  . Alzheimer's disease Mother   . Pneumonia Mother        Aspiration  . Heart attack Brother    . Heart attack Maternal Grandfather   . Colon cancer Maternal Aunt        diagnosed at 27    Social History   Socioeconomic History  . Marital status: Married    Spouse name: Not on file  . Number of children: Not on file  . Years of education: Not on file  . Highest education level: Not on file  Occupational History  . Not on file  Tobacco Use  . Smoking status: Never Smoker  . Smokeless tobacco: Never Used  Vaping Use  . Vaping Use: Never used  Substance and Sexual Activity  . Alcohol use: No    Alcohol/week: 0.0 standard drinks  . Drug use: No  . Sexual activity: Yes  Other Topics Concern  . Not on file  Social History Narrative  . Not on file   Social Determinants of Health   Financial Resource Strain:   . Difficulty of Paying Living Expenses: Not on file  Food Insecurity:   . Worried About Charity fundraiser in the Last Year: Not on file  . Ran Out of Food in the Last Year: Not on file  Transportation Needs:   . Lack of Transportation (Medical): Not on file  . Lack of Transportation (Non-Medical): Not on file  Physical Activity:   . Days of Exercise per Week: Not on file  . Minutes of Exercise per Session: Not on file  Stress:   . Feeling of Stress : Not on file  Social Connections:   . Frequency of Communication with Friends and Family: Not on file  . Frequency of Social Gatherings with Friends and Family: Not on file  . Attends Religious Services: Not on file  . Active Member of Clubs or Organizations: Not on file  . Attends Archivist Meetings: Not on file  . Marital Status: Not on file    Review of Systems: Gen: Denies fever, chills, cold or flulike symptoms, lightheadedness, dizziness, presyncope, syncope.  CV: Denies chest pain or palpitations Resp: Denies dyspnea or cough. GI: See HPI Heme: See HPI  Physical Exam: BP (!) 151/75  Pulse 62   Temp (!) 97.1 F (36.2 C) (Oral)   Ht 5' (1.524 m)   Wt 194 lb (88 kg)   BMI 37.89  kg/m  General:   Alert and oriented. No distress noted. Pleasant and cooperative.  Head:  Normocephalic and atraumatic. Eyes:  Conjuctiva clear without scleral icterus. Heart:  S1, S2 present without murmurs appreciated. Lungs:  Clear to auscultation bilaterally. No wheezes, rales, or rhonchi. No distress.  Abdomen:  +BS, soft, non-tender and non-distended. No rebound or guarding. No HSM or masses noted. Msk:  Symmetrical without gross deformities. Normal posture. Extremities:  Without edema. Neurologic:  Alert and  oriented x4 Psych:  Normal mood and affect.

## 2020-04-28 DIAGNOSIS — L400 Psoriasis vulgaris: Secondary | ICD-10-CM | POA: Diagnosis not present

## 2020-04-29 ENCOUNTER — Encounter: Payer: Self-pay | Admitting: Gastroenterology

## 2020-04-29 ENCOUNTER — Encounter: Payer: Self-pay | Admitting: *Deleted

## 2020-04-29 ENCOUNTER — Ambulatory Visit: Payer: Medicare Other | Admitting: Gastroenterology

## 2020-04-29 ENCOUNTER — Other Ambulatory Visit: Payer: Self-pay

## 2020-04-29 VITALS — BP 151/75 | HR 62 | Temp 97.1°F | Ht 60.0 in | Wt 194.0 lb

## 2020-04-29 DIAGNOSIS — D509 Iron deficiency anemia, unspecified: Secondary | ICD-10-CM | POA: Diagnosis not present

## 2020-04-29 DIAGNOSIS — K219 Gastro-esophageal reflux disease without esophagitis: Secondary | ICD-10-CM

## 2020-04-29 DIAGNOSIS — K7460 Unspecified cirrhosis of liver: Secondary | ICD-10-CM

## 2020-04-29 DIAGNOSIS — R195 Other fecal abnormalities: Secondary | ICD-10-CM

## 2020-04-29 NOTE — Assessment & Plan Note (Addendum)
Patient had developed frequent watery BMs in June 2021 following 2 rounds of antibiotics and prednisone.  Per patient, PCP checked her for C. difficile which was negative.  She was started on Bentyl and hyoscyamine and notes these medications have helped significantly.  Some days, she'll have no BM.  Other days, she may have up to 5 loose BMs.  No further watery diarrhea.  Typically taking hyoscyamine 1-2 times a day.  Rarely uses Bentyl.  No BRBPR, melena, nocturnal BMs, or abdominal pain.  She does report that dairy products will worsen diarrhea.  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  Dietary intolerances may also be playing a role.  As watery diarrhea has resolved, doubt infectious etiology.  Plan: Advise she stop Bentyl and only use hyoscyamine.  Recommended taking 1 hyoscyamine each morning and increasing as needed with max of 3/day. Follow lactose-free diet or take Lactaid tablets prior to any dairy consumption.  Lactose-free handout provided. Also recommended low-fat diet. Advised to monitor for any worsening symptoms and let us know if this occurs. Follow-up in 6 months.

## 2020-04-29 NOTE — Assessment & Plan Note (Addendum)
This 64 year old female diagnosed with cirrhosis 07/05/2019 via CT.  Extensive laboratory evaluation with positive ANA but otherwise unrevealing.  She does have history of psoriasis and rheumatoid arthritis which could also cause elevated ANA.  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  She is immune to hep A.  Received hep B vaccination in May 2021.  EGD April 2021 with no varices.  She remains well compensated with MELD Na 6. She was found to have elevated AFP in February 2021.  This was followed by MRI revealing liver cyst without worrisome features and liver morphology consistent with cirrhosis.  Repeat AFP had normalized to 6.4 in June 2021.  She is due for [facility] screening.  Plan: US Abdomen Routine labs due in December (CBC, CMP, INR, AFP). We are arranging this.  Low-sodium diet.  No more than 2 g/day. Continue to avoid alcohol. No more than 2 g of Tylenol per 24 hours. Next EGD in April 2023. Advised to monitor for swelling in lower extremities/abdomen, yellowing of eyes or skin, changes in mental status, bright red blood per rectum, or black stools and let us know if this occurs. Follow-up in 6 months.

## 2020-04-29 NOTE — Assessment & Plan Note (Addendum)
History of iron deficiency anemia in 2019.  She underwent EGD and colonoscopy at [facility] in September 2019 which were normal.  Found to have hemoglobin 8.8 in October 2020 and ferritin 11 with no overt GI bleeding.  She was having significant upper GI symptoms including dyspepsia, nausea, GERD, and dysphagia.  She was started on omeprazole daily in November 2020 which was ultimately increased to twice daily in February 2021.  EGD 11/04/2019 with normal esophagus, abnormal appearing gastric mucosa s/p biopsy, abnormal duodenal mucosa s/p biopsy, no ulcer infiltrating process.  Duodenal biopsy benign.  Gastric biopsy with reactive gastropathy with erosions, no H. Pylori.  Today she continues to deny any overt GI bleeding or other obvious bleeding.  Upper GI symptoms are well controlled on PPI twice daily.  She has resumed oral iron and most recent labs on file from June 2021 with hemoglobin 11.6, ferritin 55, iron 90, percent saturation 26%.  Patient has previously declined Givens capsule.  We discussed this again today and she continues to decline.  Etiology of IDA has not been identified.  It is possible that she had more extensive gastric erosions or possible PUD that was not identified on EGD as omeprazole was increased to twice daily 2 months prior to endoscopic evaluation.  Cannot rule out small bowel etiology.  Advised to continue oral iron daily. Continue omeprazole 40 mg twice daily. Continue to avoid all NSAIDs. Monitor for BRBPR or melena. She is due for routine cirrhosis labs in December.  We'll follow up on hemoglobin at that time. Follow-up in 6 months.

## 2020-04-29 NOTE — Patient Instructions (Addendum)
Please have ultrasound of your abdomen completed.  Please have labs completed in December.  We will arrange these.  For Cirrhosis:  Follow a low-sodium diet.  No more than 2000 mg/day. Continue to avoid alcohol. You may use Tylenol as needed.  No more than 2000 mg/day. Monitor for swelling in your abdomen or lower extremities, yellowing of your eyes or skin, changes in mental status or difficulty thinking, or bright red blood per rectum/black stools and let us know if this occurs.  Continue taking omeprazole 40 mg twice daily.  Avoid fried, fatty, greasy, spicy foods.  Limit carbonated beverages and caffeine.  Continue taking iron daily.  Please stop Bentyl and only take hyoscyamine for loose stools.  I recommend you take 1 tablet of hyoscyamine in the morning and increase as needed.  You may take this medication up to 4 times daily.  I recommend you follow a lactose-free diet or take Lactaid tablets prior to consuming any dairy products.  Please let me know if you have any worsening diarrhea/loose stools.   We will follow up with you in 6 months.  Please call with questions or concerns prior.  Aliene Altes, PA-C [facility]     Lactose-Free Diet, Adult If you have lactose intolerance, you are not able to digest lactose. Lactose is a natural sugar found mainly in dairy milk and dairy products. You may need to avoid all foods and beverages that contain lactose. A lactose-free diet can help you do this. Which foods have lactose? Lactose is found in dairy milk and dairy products, such as:  Yogurt.  Cheese.  Butter.  Margarine.  Sour cream.  Cream.  Whipped toppings and nondairy creamers.  Ice cream and other dairy-based desserts. Lactose is also found in foods or products made with dairy milk or milk ingredients. To find out whether a food contains dairy milk or a milk ingredient, look at the ingredients list. Avoid foods with the statement "May  contain milk" and foods that contain:  Milk powder.  Whey.  Curd.  Caseinate.  Lactose.  Lactalbumin.  Lactoglobulin. What are alternatives to dairy milk and foods made with milk products?  Lactose-free milk.  Soy milk with added calcium and vitamin D.  Almond milk, coconut milk, rice milk, or other nondairy milk alternatives with added calcium and vitamin D. Note that these are low in protein.  Soy products, such as soy yogurt, soy cheese, soy ice cream, and soy-based sour cream.  Other nut milk products, such as almond yogurt, almond cheese, cashew yogurt, cashew cheese, cashew ice cream, coconut yogurt, and coconut ice cream. What are tips for following this plan?  Do not consume foods, beverages, vitamins, minerals, or medicines containing lactose. Read ingredient lists carefully.  Look for the words "lactose-free" on labels.  Use lactase enzyme drops or tablets as directed by your health care provider.  Use lactose-free milk or a milk alternative, such as soy milk or almond milk, for drinking and cooking.  Make sure you get enough calcium and vitamin D in your diet. A lactose-free eating plan can be lacking in these important nutrients.  Take calcium and vitamin D supplements as directed by your health care provider. Talk to your health care provider about supplements if you are not able to get enough calcium and vitamin D from food. What foods can I eat?  Fruits All fresh, canned, frozen, or dried fruits that are not processed with lactose. Vegetables All fresh, frozen, and canned vegetables without cheese,  cream, or butter sauces. Grains Any that are not made with dairy milk or dairy products. Meats and other proteins Any meat, fish, poultry, and other protein sources that are not made with dairy milk or dairy products. Soy cheese and yogurt. Fats and oils Any that are not made with dairy milk or dairy products. Beverages Lactose-free milk. Soy, rice, or  almond milk with added calcium and vitamin D. Fruit and vegetable juices. Sweets and desserts Any that are not made with dairy milk or dairy products. Seasonings and condiments Any that are not made with dairy milk or dairy products. Calcium Calcium is found in many foods that contain lactose and is important for bone health. The amount of calcium you need depends on your age:  Adults younger than 50 years: 1,000 mg of calcium a day.  Adults older than 50 years: 1,200 mg of calcium a day. If you are not getting enough calcium, you may get it from other sources, including:  Orange juice with calcium added. There are 300-350 mg of calcium in 1 cup of orange juice.  Calcium-fortified soy milk. There are 300-400 mg of calcium in 1 cup of calcium-fortified soy milk.  Calcium-fortified rice or almond milk. There are 300 mg of calcium in 1 cup of calcium-fortified rice or almond milk.  Calcium-fortified breakfast cereals. There are 100-1,000 mg of calcium in calcium-fortified breakfast cereals.  Spinach, cooked. There are 145 mg of calcium in  cup of cooked spinach.  Edamame, cooked. There are 130 mg of calcium in  cup of cooked edamame.  Collard greens, cooked. There are 125 mg of calcium in  cup of cooked collard greens.  Kale, frozen or cooked. There are 90 mg of calcium in  cup of cooked or frozen kale.  Almonds. There are 95 mg of calcium in  cup of almonds.  Broccoli, cooked. There are 60 mg of calcium in 1 cup of cooked broccoli. The items listed above may not be a complete list of recommended foods and beverages. Contact a dietitian for more options. What foods are not recommended? Fruits None, unless they are made with dairy milk or dairy products. Vegetables None, unless they are made with dairy milk or dairy products. Grains Any grains that are made with dairy milk or dairy products. Meats and other proteins None, unless they are made with dairy milk or dairy  products. Dairy All dairy products, including milk, goat's milk, buttermilk, kefir, acidophilus milk, flavored milk, evaporated milk, condensed milk, dulce de [place], eggnog, yogurt, cheese, and cheese spreads. Fats and oils Any that are made with milk or milk products. Margarines and salad dressings that contain milk or cheese. Cream. Half and half. Cream cheese. Sour cream. Chip dips made with sour cream or yogurt. Beverages Hot chocolate. Cocoa with lactose. Instant iced teas. Powdered fruit drinks. Smoothies made with dairy milk or yogurt. Sweets and desserts Any that are made with milk or milk products. Seasonings and condiments Chewing gum that has lactose. Spice blends if they contain lactose. Artificial sweeteners that contain lactose. Nondairy creamers. The items listed above may not be a complete list of foods and beverages to avoid. Contact a dietitian for more information. Summary  If you are lactose intolerant, it means that you have a hard time digesting lactose, a natural sugar found in milk and milk products.  Following a lactose-free diet can help you manage this condition.  Calcium is important for bone health and is found in many foods that contain  lactose. Talk with your health care provider about other sources of calcium. This information is not intended to replace advice given to you by your health care provider. Make sure you discuss any questions you have with your health care provider. Document Revised: 08/08/2017 Document Reviewed: 08/08/2017 Elsevier Patient Education  2020 [employer].

## 2020-04-29 NOTE — Assessment & Plan Note (Signed)
Well-controlled on omeprazole 40 mg twice daily.  No alarm symptoms.  Advise she continue her current medications and follow-up in 6 months.

## 2020-05-01 DIAGNOSIS — H353211 Exudative age-related macular degeneration, right eye, with active choroidal neovascularization: Secondary | ICD-10-CM | POA: Diagnosis not present

## 2020-05-01 DIAGNOSIS — E1165 Type 2 diabetes mellitus with hyperglycemia: Secondary | ICD-10-CM | POA: Diagnosis not present

## 2020-05-01 DIAGNOSIS — Z299 Encounter for prophylactic measures, unspecified: Secondary | ICD-10-CM | POA: Diagnosis not present

## 2020-05-01 DIAGNOSIS — I10 Essential (primary) hypertension: Secondary | ICD-10-CM | POA: Diagnosis not present

## 2020-05-01 DIAGNOSIS — L4050 Arthropathic psoriasis, unspecified: Secondary | ICD-10-CM | POA: Diagnosis not present

## 2020-05-01 DIAGNOSIS — K7460 Unspecified cirrhosis of liver: Secondary | ICD-10-CM | POA: Diagnosis not present

## 2020-05-06 ENCOUNTER — Other Ambulatory Visit: Payer: Self-pay

## 2020-05-06 ENCOUNTER — Ambulatory Visit (HOSPITAL_COMMUNITY)
Admission: RE | Admit: 2020-05-06 | Discharge: 2020-05-06 | Disposition: A | Discharge status: Home / Self Care | Payer: Medicare Other | Source: Ambulatory Visit | Attending: Gastroenterology | Admitting: Gastroenterology

## 2020-05-06 DIAGNOSIS — K7460 Unspecified cirrhosis of liver: Secondary | ICD-10-CM | POA: Insufficient documentation

## 2020-05-06 IMAGING — US US ABDOMEN COMPLETE
1 series · 14 of 25 positions shown · non-contrast
Comparison: [DATE] MRI abdomen. [DATE] abdominal
ultrasound.

CLINICAL DATA: Cirrhosis, HCC screening.

EXAM:
ABDOMEN ULTRASOUND COMPLETE

[Series 1: us abdomen complete · 14 of 87 slices shown]
[im 1/87]
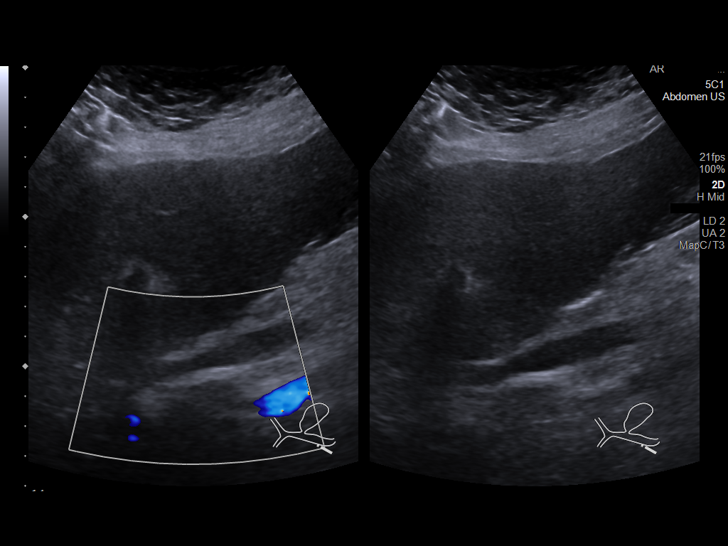
[im 8/87]
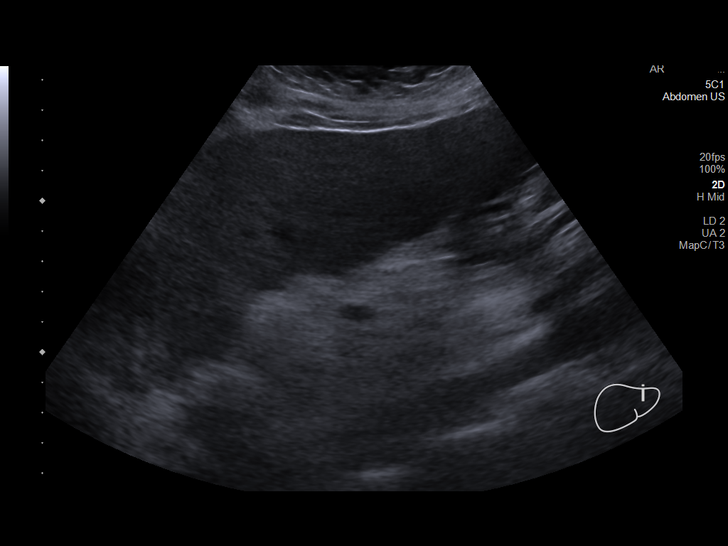
[im 15/87]
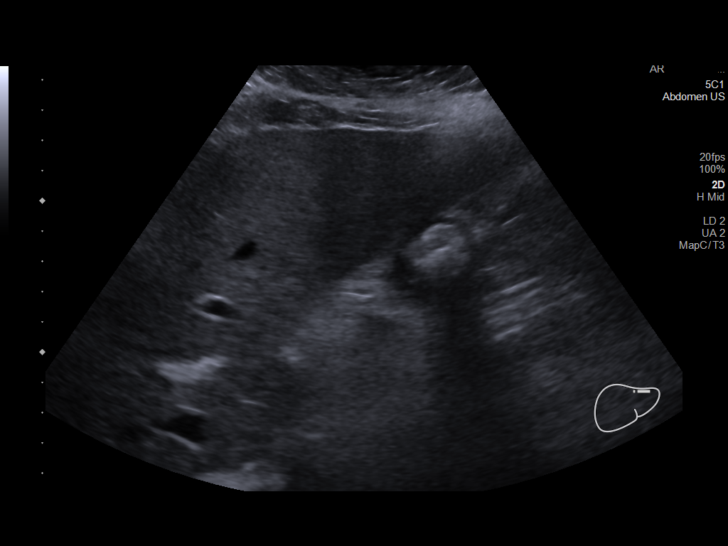
[im 22/87]
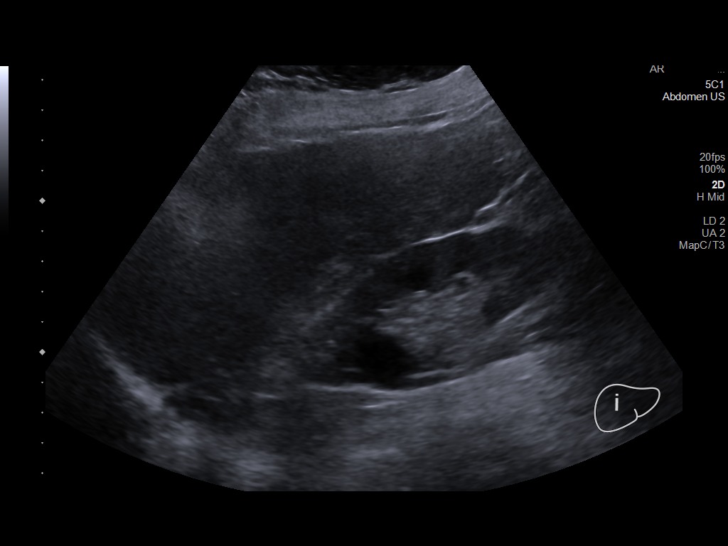
[im 29/87]
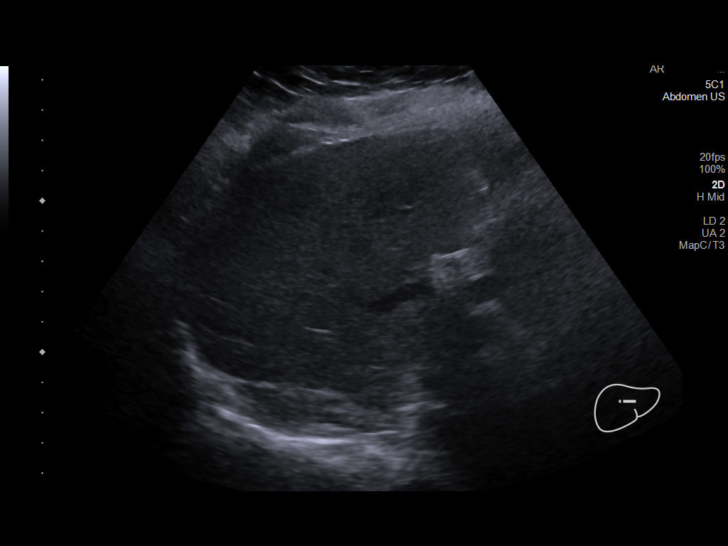
[im 33/87]
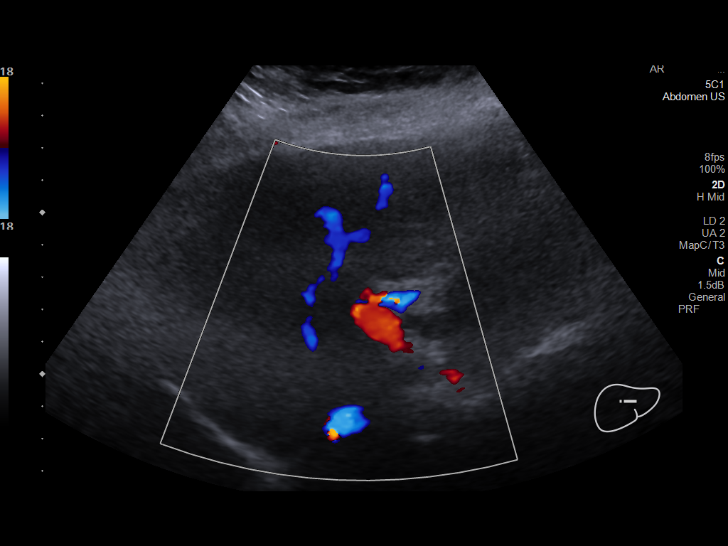
[im 40/87]
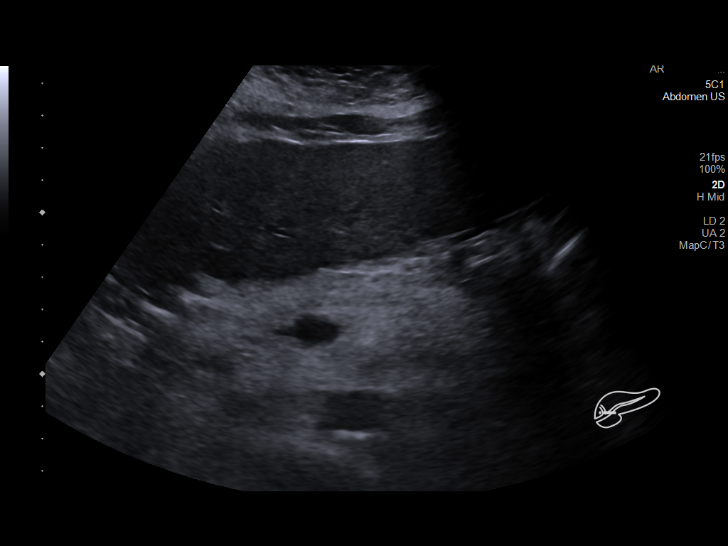
[im 47/87]
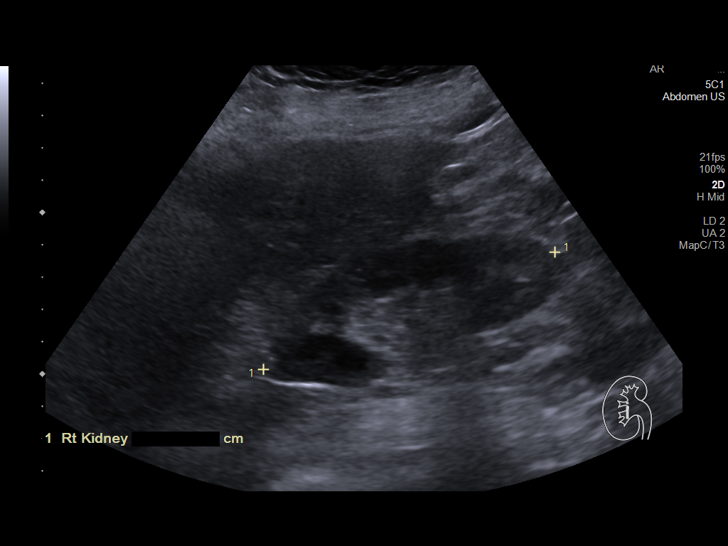
[im 54/87]
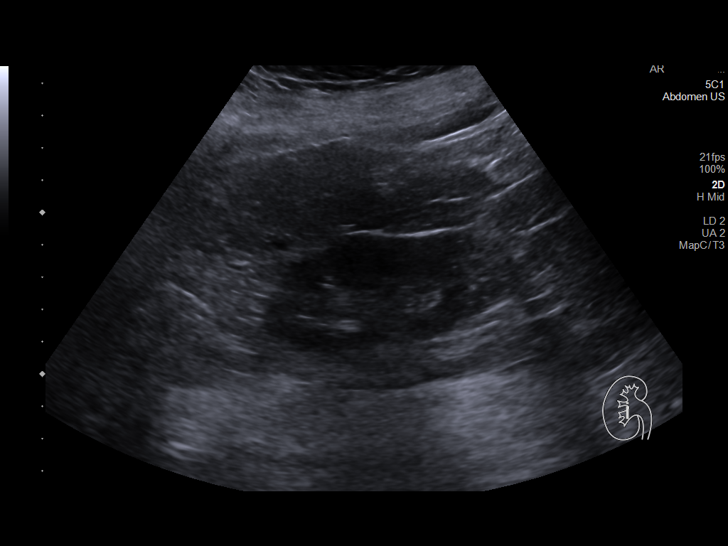
[im 58/87]
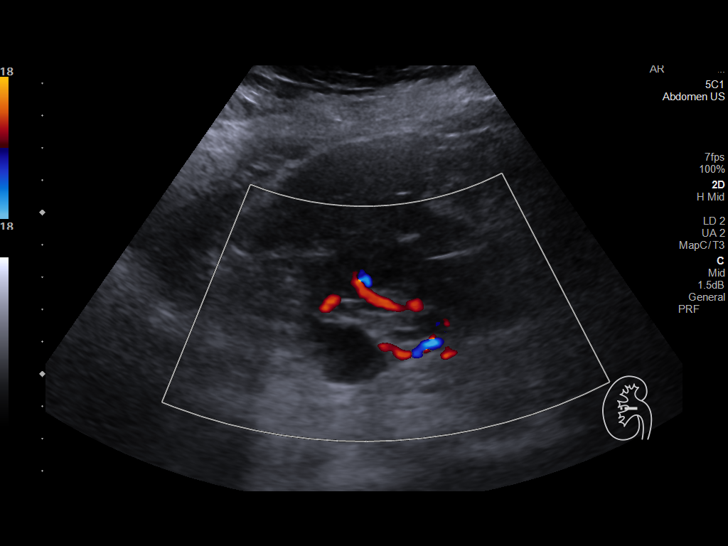
[im 65/87]
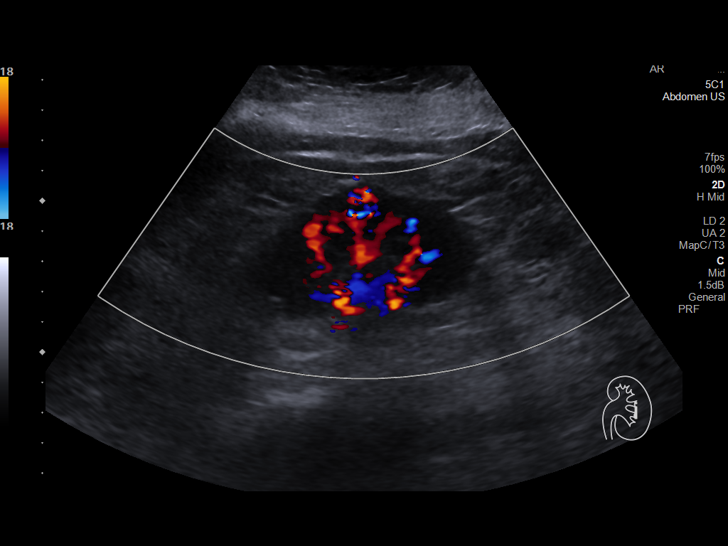
[im 72/87]
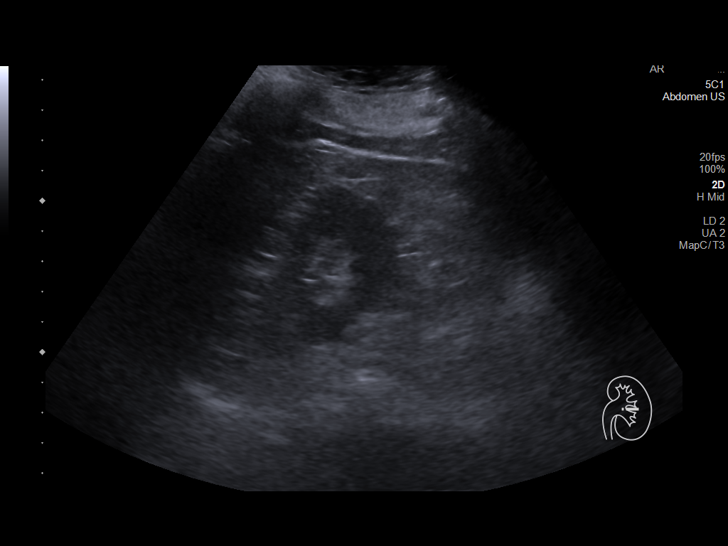
[im 79/87]
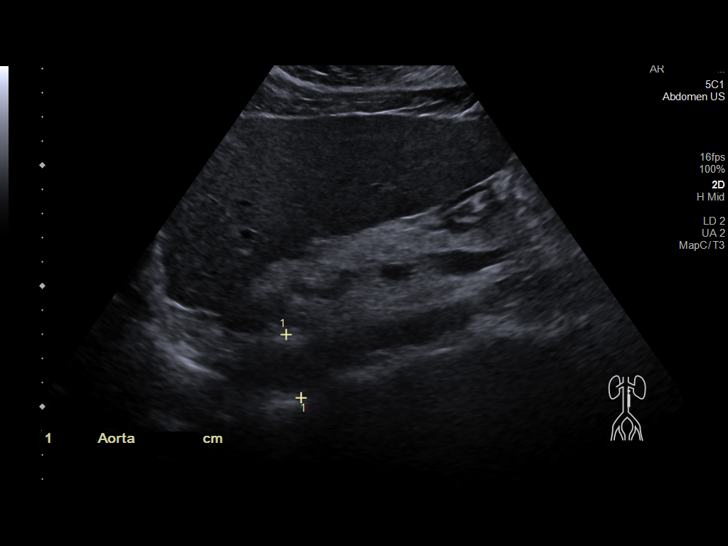
[im 87/87]
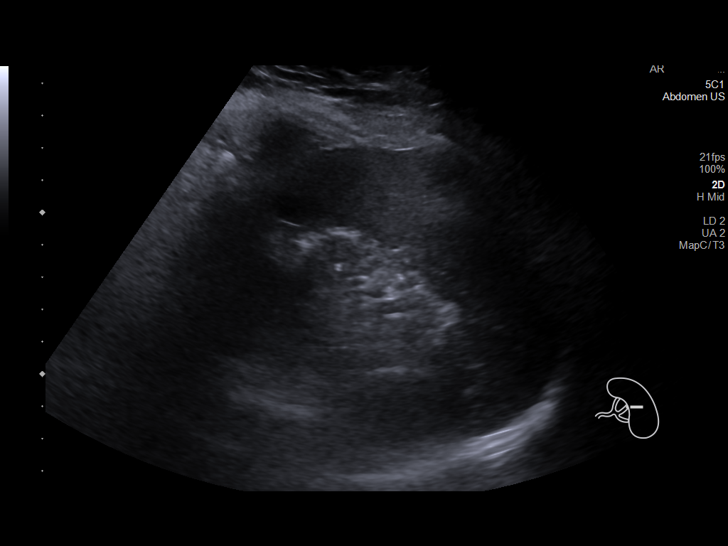

[14 of 25 positions shown; findings below may reference images not displayed]

FINDINGS: Gallbladder: Surgically absent.

Common bile duct: Diameter: 10 mm, compatible with post
cholecystectomy sequela.

Liver: No focal lesion identified. Hepatic cyst seen on prior MRI
are not demonstrated on this exam. Increased parenchymal
echogenicity with nodular contour. Portal vein is patent on color
Doppler imaging with normal direction of blood flow towards the
liver.

IVC: No abnormality visualized.

Pancreas: Visualized portion unremarkable.

Spleen: Size and appearance within normal limits.

Right Kidney: Length: 9.7 cm. Echogenicity within normal limits.
x 2.0 x 1.7 cm right upper pole cyst. No solid mass or
hydronephrosis visualized.

Left Kidney: Length: 9.3 cm. Echogenicity within normal limits. No
mass or hydronephrosis visualized.

Abdominal aorta: No aneurysm visualized.

Other findings: None.
IMPRESSION: Cirrhotic liver morphology with steatosis.  No focal hepatic lesion.

Sequela of cholecystectomy.

2.1 cm right renal cyst.

## 2020-05-08 NOTE — Progress Notes (Signed)
No focal liver lesions.  She does have a cyst on her right kidney that has been present on prior abdominal imaging.  She can follow-up with PCP on this.

## 2020-05-19 ENCOUNTER — Ambulatory Visit (INDEPENDENT_AMBULATORY_CARE_PROVIDER_SITE_OTHER): Payer: Medicare Other | Admitting: Ophthalmology

## 2020-05-19 ENCOUNTER — Other Ambulatory Visit: Payer: Self-pay

## 2020-05-19 ENCOUNTER — Encounter (INDEPENDENT_AMBULATORY_CARE_PROVIDER_SITE_OTHER): Payer: Self-pay | Admitting: Ophthalmology

## 2020-05-19 DIAGNOSIS — H353211 Exudative age-related macular degeneration, right eye, with active choroidal neovascularization: Secondary | ICD-10-CM

## 2020-05-19 DIAGNOSIS — H353122 Nonexudative age-related macular degeneration, left eye, intermediate dry stage: Secondary | ICD-10-CM | POA: Diagnosis not present

## 2020-05-19 MED ORDER — BEVACIZUMAB CHEMO INJECTION 1.25MG/0.05ML SYRINGE FOR KALEIDOSCOPE
1.2500 mg | INTRAVITREAL | Status: AC | PRN
  Administered 2020-05-19: 1.25 mg via INTRAVITREAL

## 2020-05-19 NOTE — Assessment & Plan Note (Signed)
The nature of age--related macular degeneration was discussed with the patient as well as the distinction between dry and wet types. Checking an Amsler Grid daily with advice to return immediately should a distortion develop, was given to the patient. The patient 's smoking status now and in the past was determined and advice based on the AREDS study was provided regarding the consumption of antioxidant supplements. AREDS 2 vitamin formulation was recommended. Consumption of dark leafy vegetables and fresh fruits of various colors was recommended. Treatment modalities for wet macular degeneration particularly the use of intravitreal injections of anti-blood vessel growth factors was discussed with the patient. Avastin, Lucentis, and Eylea are the available options. On occasion, therapy includes the use of photodynamic therapy and thermal laser. Stressed to the patient do not rub eyes.  Patient was advised to check Amsler Grid daily and return immediately if changes are noted. Instructions on using the grid were given to the patient. All patient questions were answered.  No signs of CNVM OS 

## 2020-05-19 NOTE — Progress Notes (Signed)
05/19/2020     CHIEF COMPLAINT Patient presents for Retina Follow Up   HISTORY OF PRESENT ILLNESS: Kristine Rocha is a 64 y.o. 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  Severity is moderate.  Duration of 3 months.  Since onset it is stable.  I, the attending physician,  performed the HPI with the patient and updated documentation appropriately.          Comments    3 Month Wet AMD f\u. Possible Avastin OD. OCT  Pt states vision has been about the same. Denies any new complaints. BGL: did not check today         128 yesterday A1C: 6.2        Last edited by Tilda Franco on 05/19/2020 10:45 AM. (History)      Referring physician: Glenda Chroman, MD [place],  [place] [address]  HISTORICAL INFORMATION:   Selected notes from the MEDICAL RECORD NUMBER       CURRENT MEDICATIONS: No current outpatient medications on file. (Ophthalmic Drugs)   No current facility-administered medications for this visit. (Ophthalmic Drugs)   Current Outpatient Medications (Other)  Medication Sig  . allopurinol (ZYLOPRIM) 300 MG tablet Take 300 mg by mouth every evening.   Marland Kitchen atorvastatin (LIPITOR) 10 MG tablet Take 10 mg by mouth every evening.   . cetirizine (ZYRTEC) 10 MG tablet Take 10 mg by mouth daily.  . Cyanocobalamin (VITAMIN B-12 PO) Take by mouth daily.  . diphenhydrAMINE (BENADRYL) 25 MG tablet Take 25 mg by mouth at bedtime. As needed  . hyoscyamine (LEVSIN SL) 0.125 MG SL tablet Place under the tongue 3 (three) times daily as needed.  Marland Kitchen levothyroxine (SYNTHROID) 88 MCG tablet Take 88 mcg by mouth daily.  . Melatonin 10 MG TABS Take 10 mg by mouth at bedtime.   . metFORMIN (GLUCOPHAGE-XR) 500 MG 24 hr tablet Take 500 mg by mouth every evening.   . metoprolol succinate (TOPROL-XL) 100 MG 24 hr tablet Take 100 mg by mouth daily. *Hold for low blood pressure*  . Multiple Vitamins-Minerals  (PRESERVISION AREDS 2 PO) Take 1 tablet by mouth 2 (two) times daily.  Marland Kitchen omeprazole (PRILOSEC) 40 MG capsule Take 1 capsule (40 mg total) by mouth 2 (two) times daily before a meal.  . ONETOUCH VERIO test strip   . OVER THE COUNTER MEDICATION OTC Iron daily  . tiZANidine (ZANAFLEX) 4 MG tablet Take 4 mg by mouth 3 (three) times daily as needed for muscle spasms.   . Vitamin D, Ergocalciferol, (DRISDOL) 1.25 MG (50000 UNIT) CAPS capsule Take 50,000 Units by mouth every 7 (seven) days.  Marland Kitchen VITAMIN E PO Take 1 capsule by mouth every evening.   No current facility-administered medications for this visit. (Other)      REVIEW OF SYSTEMS: ROS    Positive for: Endocrine   Last edited by Tilda Franco on 05/19/2020 10:45 AM. (History)       ALLERGIES Allergies  Allergen Reactions  . Amoxicillin Hives, Shortness Of Breath and Rash    Did it involve swelling of the face/tongue/throat, SOB, or low BP? Yes Did it involve sudden or severe rash/hives, skin peeling, or any reaction on the inside of your mouth or nose? Unknown Did you need to seek medical attention at a hospital or doctor's office? Yes When did it  last happen?~15-20 years ago If all above answers are "NO", may proceed with cephalosporin use. .   . Ampicillin Hives, Shortness Of Breath and Rash    Did it involve swelling of the face/tongue/throat, SOB, or low BP? Yes Did it involve sudden or severe rash/hives, skin peeling, or any reaction on the inside of your mouth or nose? Unknown Did you need to seek medical attention at a hospital or doctor's office? Yes When did it last happen?~15-20 years ago If all above answers are "NO", may proceed with cephalosporin use. .  . Other     NON-STEROIDAL ANTI-INFLAMMATORY DRUG CAUSED GASTRIC BLEED  . Prevacid [Lansoprazole] Diarrhea    PAST MEDICAL HISTORY Past Medical History:  Diagnosis Date  . Asthma   . Cirrhosis ([place]) 06/2019   Immune to Hep A. Has completed  Hep B vaccine (May 2021)  . Diabetes ([place])   . Essential hypertension   . GERD (gastroesophageal reflux disease)   . History of GI bleed   . Hypothyroidism   . Macular degeneration    wet in right, dry in left, has injections for this.  . Microcytic anemia   . Palpitations   . Peptic ulcer disease   . Postural orthostatic tachycardia syndrome    Mildly orthostatic results tilt table test 2005  - Dr. Caryl Comes  . Psoriasis   . Rheumatoid arthritis [facility])    Past Surgical History:  Procedure Laterality Date  . ABDOMINAL HERNIA REPAIR  15 years ago   umbilical with mesh  . ABDOMINAL HYSTERECTOMY  20 years ago   . BIOPSY  11/04/2019   Procedure: BIOPSY;  Surgeon: Daneil Dolin, MD;  Location: [facility];  Service: Endoscopy;;  duodenum gastric  . CATARACT EXTRACTION, BILATERAL Bilateral   . CHOLECYSTECTOMY    . COLONOSCOPY WITH ESOPHAGOGASTRODUODENOSCOPY (EGD)  03/2018   [facility]; indication:IDA; Normal exam.   . ESOPHAGOGASTRODUODENOSCOPY (EGD) WITH PROPOFOL N/A 11/04/2019   Procedure: ESOPHAGOGASTRODUODENOSCOPY (EGD) WITH PROPOFOL;  Surgeon: Daneil Dolin, MD; normal esophagus s/p empiric dilation, abnormal appearing gastric mucosa s/p biopsy, abnormal duodenal mucosa s/p biopsied, no ulcer, infiltrating process, or portal gastropathy.  Duodenal biopsies benign.  Gastric biopsy with reactive gastropathy with erosions, no H. pylori.   Marland Kitchen MALONEY DILATION N/A 11/04/2019   Procedure: Venia Minks DILATION;  Surgeon: Daneil Dolin, MD;  Location: [facility];  Service: Endoscopy;  Laterality: N/A;  . TILT TABLE STUDY  2005   [facility]  . TRIGGER FINGER RELEASE Right    2nd finger    FAMILY HISTORY Family History  Problem Relation Age of Onset  . Heart attack Father        Age 88  . Arrhythmia Sister        WPW s/p ablation- corrected at [facility]  . Alzheimer's disease Mother   . Pneumonia Mother        Aspiration  . Heart attack Brother   . Heart attack Maternal Grandfather     . Colon cancer Maternal Aunt        diagnosed at 82    SOCIAL HISTORY Social History   Tobacco Use  . Smoking status: Never Smoker  . Smokeless tobacco: Never Used  Vaping Use  . Vaping Use: Never used  Substance Use Topics  . Alcohol use: No    Alcohol/week: 0.0 standard drinks  . Drug use: No         OPHTHALMIC EXAM:  Base Eye Exam    Visual Acuity (Snellen -  Linear)      Right Left   Dist [place] 20/80 + 20/20   Dist ph [place] NI        Tonometry (Tonopen, 10:49 AM)      Right Left   Pressure 8 10       Pupils      Pupils Dark Light Shape React APD   Right PERRL 3 2 Round Sluggish None   Left PERRL 3 2 Round Sluggish None       Visual Fields (Counting fingers)      Left Right    Full        Neuro/Psych    Oriented x3: Yes   Mood/Affect: Normal       Dilation    Both eyes: 1.0% Mydriacyl, 2.5% Phenylephrine @ 10:49 AM        Slit Lamp and Fundus Exam    External Exam      Right Left   External Normal Normal       Slit Lamp Exam      Right Left   Lids/Lashes Normal Normal   Conjunctiva/Sclera White and quiet White and quiet   Cornea Clear Clear   Anterior Chamber Deep and quiet Deep and quiet   Iris Round and reactive, TI defect 530-630 Round and reactive   Lens Posterior chamber intraocular lens Posterior chamber intraocular lens   Anterior Vitreous Normal Normal       Fundus Exam      Right Left   Posterior Vitreous Posterior vitreous detachment Posterior vitreous detachment   Disc Normal Normal   C/D Ratio 0.45 0.5   Macula Retinal pigment epithelial atrophy, Advanced age related macular degeneration, Hard drusen, no exudates, no hemorrhage, Mottling, Retinal pigment epithelial mottling, Geographic atrophy, Soft drusen, Macular thickening Hard drusen, no hemorrhage, no macular thickening, no exudates   Vessels Normal Normal   Periphery Normal Normal          IMAGING AND PROCEDURES  Imaging and Procedures for 05/19/20  OCT, Retina -  OU - Both Eyes       Right Eye Quality was good. Scan locations included subfoveal. Central Foveal Thickness: 193. Progression has been stable. Findings include abnormal foveal contour, no IRF, subretinal fluid, subretinal scarring, subretinal hyper-reflective material.   Left Eye Quality was good. Scan locations included subfoveal. Central Foveal Thickness: 286. Progression has been stable. Findings include abnormal foveal contour, no IRF, no SRF.        Intravitreal Injection, Pharmacologic Agent - OD - Right Eye       Time Out 05/19/2020. 11:32 AM. Confirmed correct patient, procedure, site, and patient consented.   Anesthesia Topical anesthesia was used. Anesthetic medications included Akten 3.5%.   Procedure Preparation included 10% betadine to eyelids, 5% betadine to ocular surface, Ofloxacin . A 30 gauge needle was used.   Injection:  1.25 mg Bevacizumab (AVASTIN) SOLN   NDC: 70360-001-02, Lot: [id number]   Route: Intravitreal, Site: Right Eye, Waste: 0 mg  Post-op Post injection exam found visual acuity of at least counting fingers. The patient tolerated the procedure well. There were no complications. The patient received written and verbal post procedure care education. Post injection medications were not given.                 ASSESSMENT/PLAN:  Exudative age-related macular degeneration of right eye with active choroidal neovascularization (HCC) The nature of wet macular degeneration was discussed with the patient.  Forms of therapy reviewed include the use of  Anti-VEGF medications injected painlessly into the eye, as well as other possible treatment modalities, including thermal laser therapy. Fellow eye involvement and risks were discussed with the patient. Upon the finding of wet age related macular degeneration, treatment will be offered. The treatment regimen is on a treat as needed basis with the intent to treat if necessary and extend interval of exams when  possible. On average 1 out of 6 patients do not need lifetime therapy. However, the risk of recurrent disease is high for a lifetime.  Initially monthly, then periodic, examinations and evaluations will determine whether the next treatment is required on the day of the examination.  Acuity limited by foveal atrophy and some scarring of the outer retina from prior active disease, still anatomically well controlled at 10-month follow-up examination and interval of therapy.  Intermediate stage nonexudative age-related macular degeneration of left eye The nature of age--related macular degeneration was discussed with the patient as well as the distinction between dry and wet types. Checking an Amsler Grid daily with advice to return immediately should a distortion develop, was given to the patient. The patient 's smoking status now and in the past was determined and advice based on the AREDS study was provided regarding the consumption of antioxidant supplements. AREDS 2 vitamin formulation was recommended. Consumption of dark leafy vegetables and fresh fruits of various colors was recommended. Treatment modalities for wet macular degeneration particularly the use of intravitreal injections of anti-blood vessel growth factors was discussed with the patient. Avastin, Lucentis, and Eylea are the available options. On occasion, therapy includes the use of photodynamic therapy and thermal laser. Stressed to the patient do not rub eyes.  Patient was advised to check Amsler Grid daily and return immediately if changes are noted. Instructions on using the grid were given to the patient. All patient questions were answered.  No signs of CNVM OS      ICD-10-CM   1. Exudative age-related macular degeneration of right eye with active choroidal neovascularization (HCC)  H35.3211 OCT, Retina - OU - Both Eyes    Intravitreal Injection, Pharmacologic Agent - OD - Right Eye    Bevacizumab (AVASTIN) SOLN 1.25 mg  2.  Intermediate stage nonexudative age-related macular degeneration of left eye  H35.3122     1.  Repeat intravitreal Avastin OD today  2.  Dilated OU in 3 months and consider repeat Avastin OD at that time  3.  Ophthalmic Meds Ordered this visit:  Meds ordered this encounter  Medications  . Bevacizumab (AVASTIN) SOLN 1.25 mg       Return in about 3 months (around 08/19/2020) for DILATE OU, AVASTIN OCT, OD.  There are no Patient Instructions on file for this visit.   Explained the diagnoses, plan, and follow up with the patient and they expressed understanding.  Patient expressed understanding of the importance of proper follow up care.   Clent Demark Veleta Yamamoto M.D. Diseases & Surgery of the Retina and Vitreous [facility]ina [facility] Di[facility] [place] 05/19/20     Abbreviations: M myopia (nearsighted); A astigmatism; H hyperopia (farsighted); P presbyopia; Mrx spectacle prescription;  CTL contact lenses; OD right eye; OS left eye; OU both eyes  XT exotropia; ET esotropia; PEK punctate epithelial keratitis; PEE punctate epithelial erosions; DES dry eye syndrome; MGD meibomian gland dysfunction; ATs artificial tears; PFAT's preservative free artificial tears; [place] nuclear sclerotic cataract; PSC posterior subcapsular cataract; ERM epi-retinal membrane; PVD posterior vitreous detachment; RD retinal detachment; DM diabetes mellitus; DR diabetic retinopathy; NPDR non-proliferative  diabetic retinopathy; PDR proliferative diabetic retinopathy; CSME clinically significant macular edema; DME diabetic macular edema; dbh dot blot hemorrhages; CWS cotton wool spot; POAG primary open angle glaucoma; C/D cup-to-disc ratio; HVF humphrey visual field; GVF goldmann visual field; OCT optical coherence tomography; IOP intraocular pressure; BRVO Branch retinal vein occlusion; CRVO central retinal vein occlusion; CRAO central retinal artery occlusion; BRAO branch retinal artery occlusion; RT retinal tear; SB scleral buckle; PPV  pars plana vitrectomy; VH Vitreous hemorrhage; PRP panretinal laser photocoagulation; IVK intravitreal kenalog; VMT vitreomacular traction; MH Macular hole;  NVD neovascularization of the disc; NVE neovascularization elsewhere; AREDS age related eye disease study; ARMD age related macular degeneration; POAG primary open angle glaucoma; EBMD epithelial/anterior basement membrane dystrophy; ACIOL anterior chamber intraocular lens; IOL intraocular lens; PCIOL posterior chamber intraocular lens; Phaco/IOL phacoemulsification with intraocular lens placement; [place] photorefractive keratectomy; LASIK laser assisted in situ keratomileusis; HTN hypertension; DM diabetes mellitus; COPD chronic obstructive pulmonary disease

## 2020-05-19 NOTE — Assessment & Plan Note (Signed)
The nature of wet macular degeneration was discussed with the patient.  Forms of therapy reviewed include the use of Anti-VEGF medications injected painlessly into the eye, as well as other possible treatment modalities, including thermal laser therapy. Fellow eye involvement and risks were discussed with the patient. Upon the finding of wet age related macular degeneration, treatment will be offered. The treatment regimen is on a treat as needed basis with the intent to treat if necessary and extend interval of exams when possible. On average 1 out of 6 patients do not need lifetime therapy. However, the risk of recurrent disease is high for a lifetime.  Initially monthly, then periodic, examinations and evaluations will determine whether the next treatment is required on the day of the examination.  Acuity limited by foveal atrophy and some scarring of the outer retina from prior active disease, still anatomically well controlled at 74-month follow-up examination and interval of therapy.

## 2020-05-22 DIAGNOSIS — I10 Essential (primary) hypertension: Secondary | ICD-10-CM | POA: Diagnosis not present

## 2020-05-22 DIAGNOSIS — E119 Type 2 diabetes mellitus without complications: Secondary | ICD-10-CM | POA: Diagnosis not present

## 2020-05-26 DIAGNOSIS — Z23 Encounter for immunization: Secondary | ICD-10-CM | POA: Diagnosis not present

## 2020-05-26 DIAGNOSIS — Z299 Encounter for prophylactic measures, unspecified: Secondary | ICD-10-CM | POA: Diagnosis not present

## 2020-06-11 ENCOUNTER — Other Ambulatory Visit: Payer: Self-pay

## 2020-06-11 DIAGNOSIS — K7460 Unspecified cirrhosis of liver: Secondary | ICD-10-CM

## 2020-06-26 ENCOUNTER — Other Ambulatory Visit: Payer: Self-pay | Admitting: Internal Medicine

## 2020-06-26 DIAGNOSIS — Z1231 Encounter for screening mammogram for malignant neoplasm of breast: Secondary | ICD-10-CM

## 2020-06-29 ENCOUNTER — Other Ambulatory Visit: Payer: Self-pay

## 2020-06-29 ENCOUNTER — Ambulatory Visit
Admission: RE | Admit: 2020-06-29 | Discharge: 2020-06-29 | Disposition: A | Discharge status: Home / Self Care | Payer: Medicare Other | Source: Ambulatory Visit | Attending: Internal Medicine | Admitting: Internal Medicine

## 2020-06-29 DIAGNOSIS — Z1231 Encounter for screening mammogram for malignant neoplasm of breast: Secondary | ICD-10-CM | POA: Diagnosis not present

## 2020-06-29 IMAGING — MG DIGITAL SCREENING BILAT W/ TOMO W/ CAD
8 series · 8 of 24 positions shown · non-contrast
Comparison: Previous exam(s).

CLINICAL DATA: Screening.

EXAM:
DIGITAL SCREENING BILATERAL MAMMOGRAM WITH TOMO AND CAD

[L MLO synth-2D]
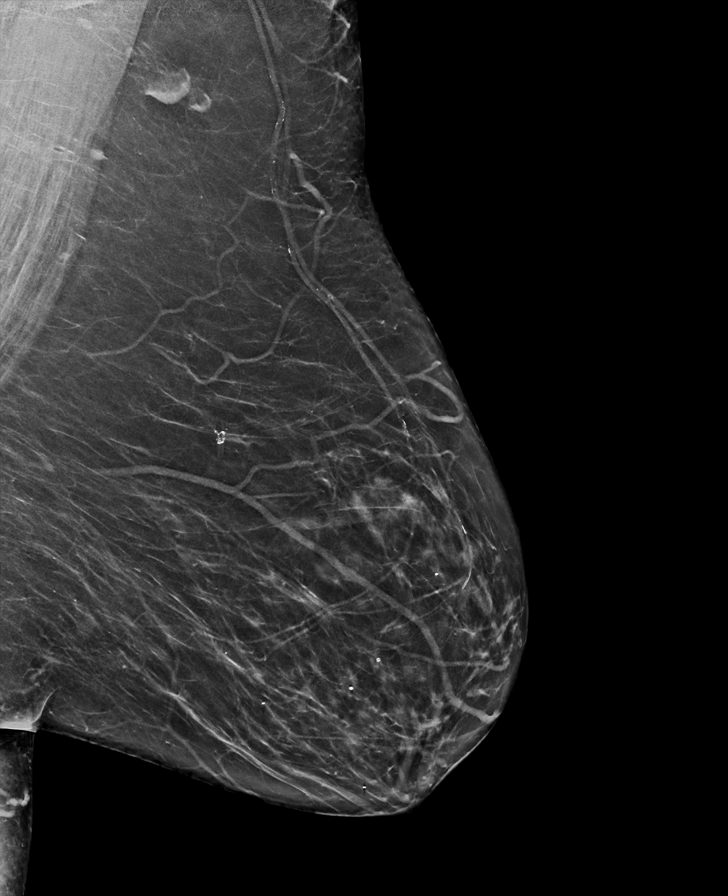

[R MLO synth-2D]
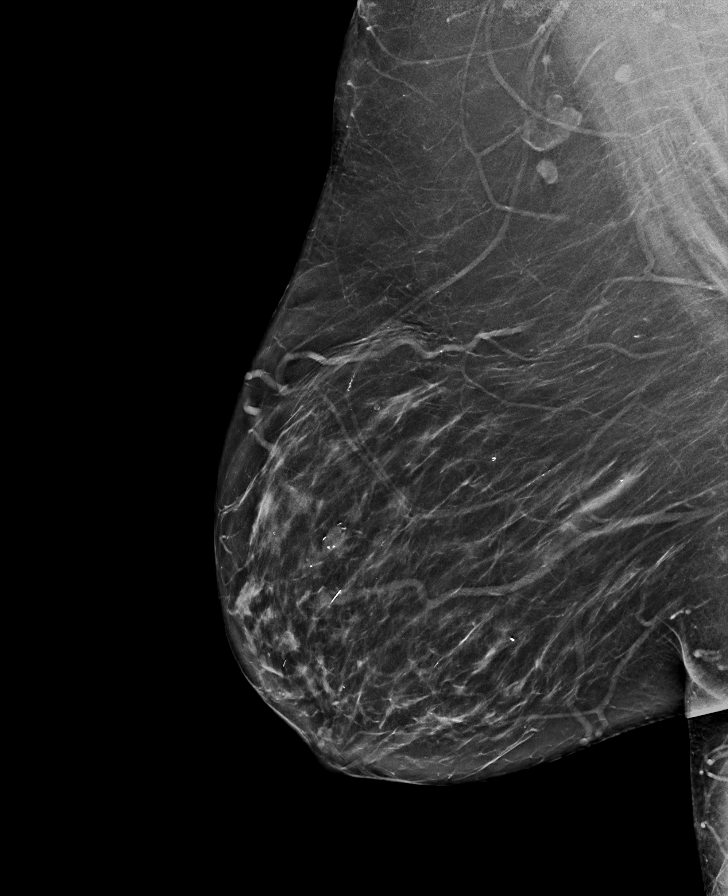

[R CC synth-2D]
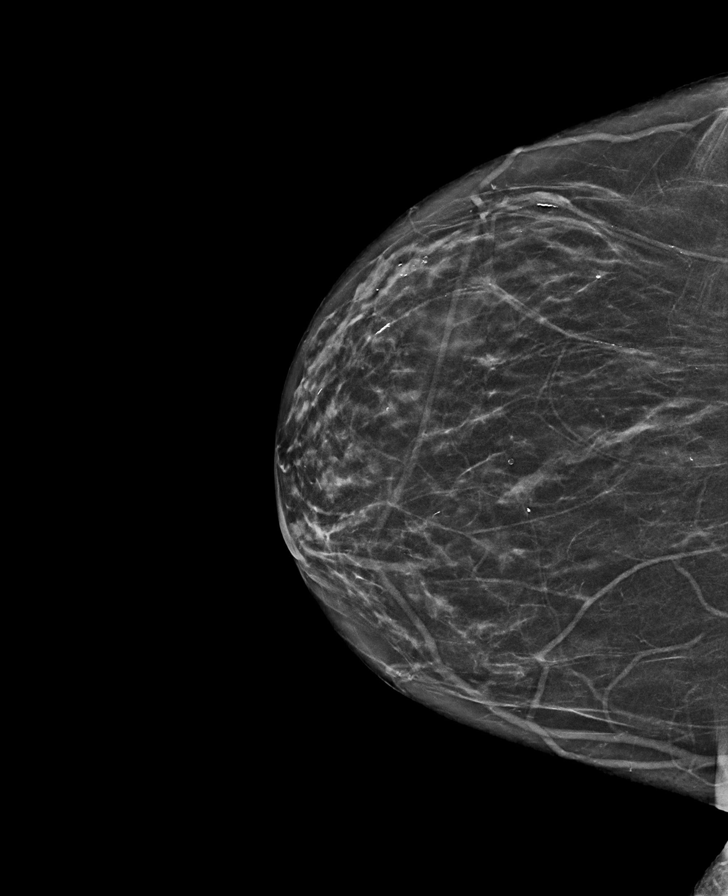

[L CC synth-2D]
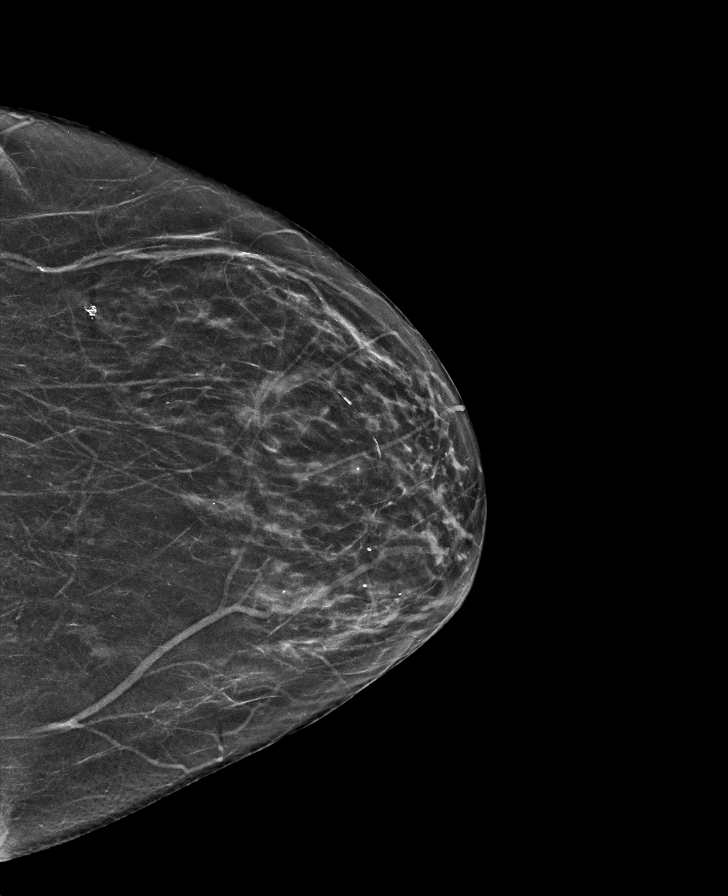

[L CC tomo · tomo slice 28/55.0]
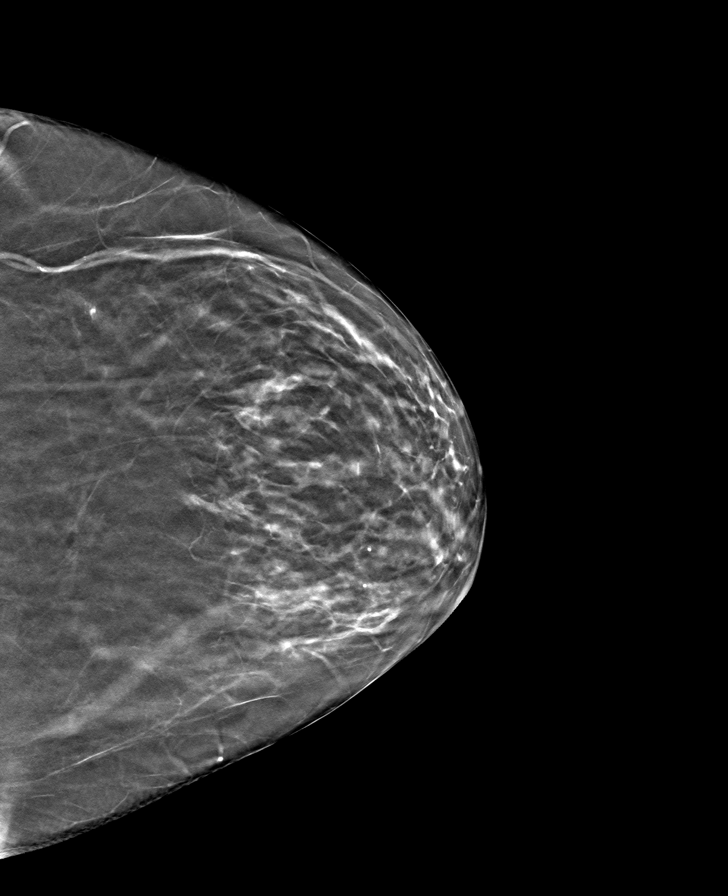

[L MLO tomo · tomo slice 33/65.0]
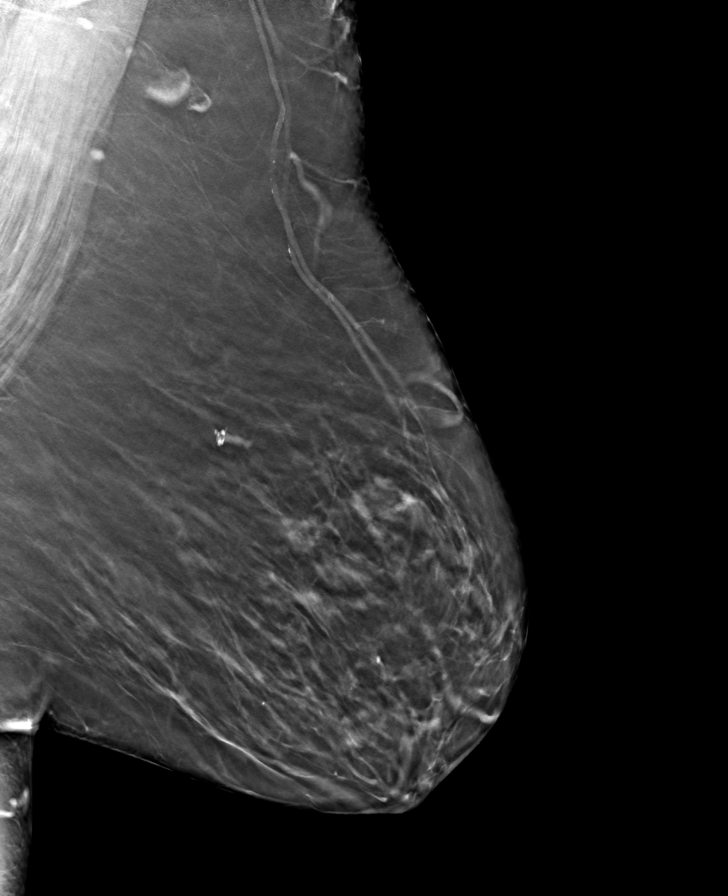

[R MLO tomo · tomo slice 35/70.0]
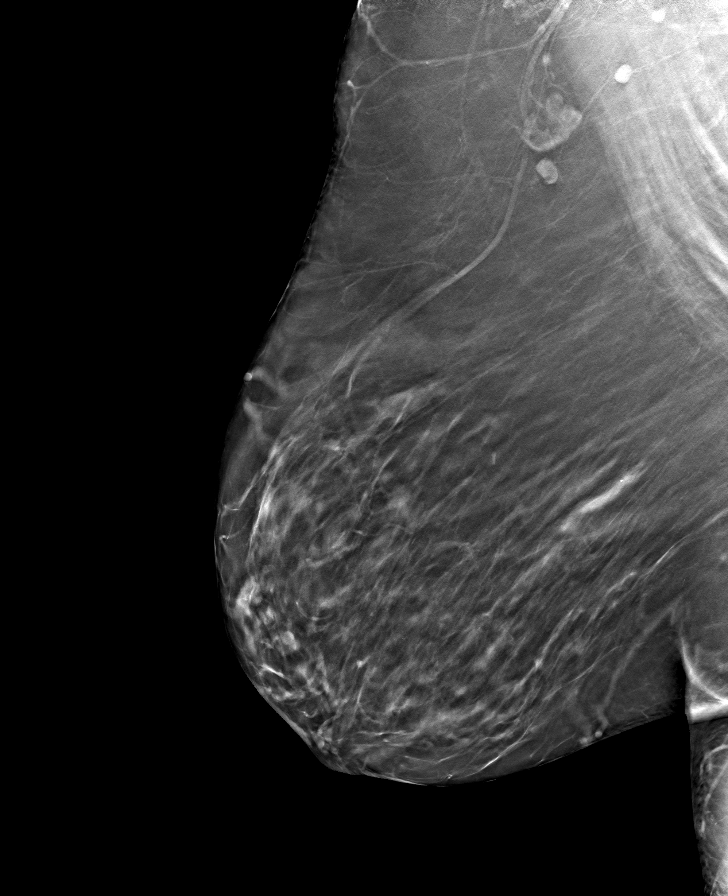

[R CC tomo · tomo slice 29/56.0]
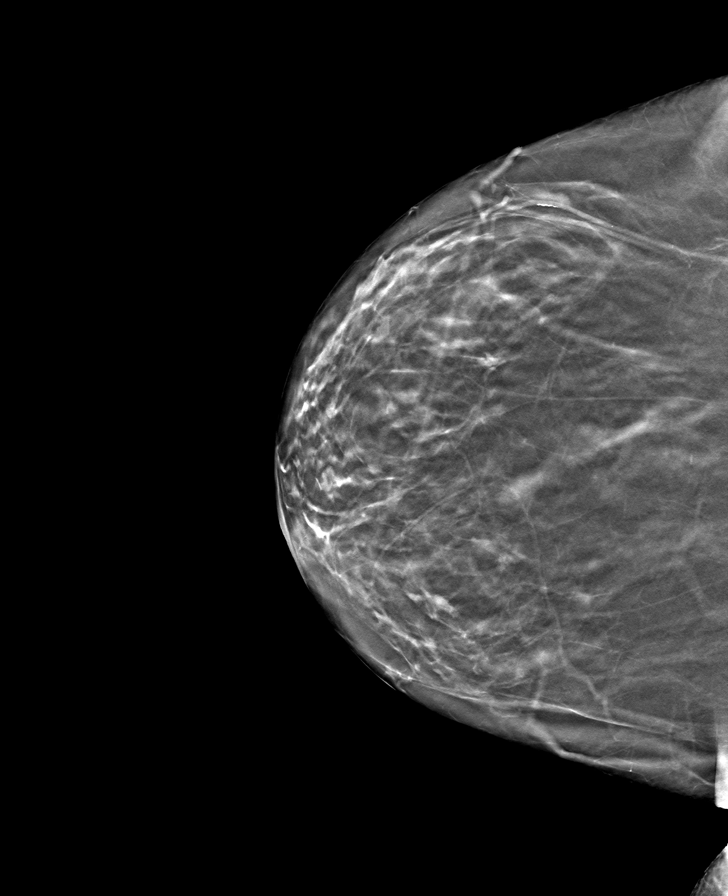

[8 of 24 positions shown; findings below may reference images not displayed]

ACR Breast Density Category b: There are scattered areas of
fibroglandular density.
FINDINGS: There are no findings suspicious for malignancy. Images were
processed with CAD.
IMPRESSION: No mammographic evidence of malignancy. A result letter of this
screening mammogram will be mailed directly to the patient.

RECOMMENDATION:
Screening mammogram in one year. (Code:[TQ])

BI-RADS CATEGORY  1: Negative.

## 2020-07-01 ENCOUNTER — Other Ambulatory Visit: Payer: Self-pay | Admitting: Gastroenterology

## 2020-07-01 DIAGNOSIS — K7460 Unspecified cirrhosis of liver: Secondary | ICD-10-CM | POA: Diagnosis not present

## 2020-07-02 ENCOUNTER — Telehealth: Payer: Self-pay | Admitting: *Deleted

## 2020-07-02 LAB — COMPREHENSIVE METABOLIC PANEL
ALT: 19 IU/L (ref 0–32)
AST: 29 IU/L (ref 0–40)
Albumin/Globulin Ratio: 1.4 (ref 1.2–2.2)
Albumin: 4 g/dL (ref 3.8–4.8)
Alkaline Phosphatase: 133 IU/L — ABNORMAL HIGH (ref 44–121)
BUN/Creatinine Ratio: 12 (ref 12–28)
BUN: 14 mg/dL (ref 8–27)
Bilirubin Total: 0.4 mg/dL (ref 0.0–1.2)
CO2: 21 mmol/L (ref 20–29)
Calcium: 8.9 mg/dL (ref 8.7–10.3)
Chloride: 102 mmol/L (ref 96–106)
Creatinine, Ser: 1.16 mg/dL — ABNORMAL HIGH (ref 0.57–1.00)
GFR calc Af Amer: 57 mL/min/{1.73_m2} — ABNORMAL LOW (ref >59)
GFR calc non Af Amer: 50 mL/min/{1.73_m2} — ABNORMAL LOW (ref >59)
Globulin, Total: 2.9 g/dL (ref 1.5–4.5)
Glucose: 123 mg/dL — ABNORMAL HIGH (ref 65–99)
Potassium: 5.9 mmol/L — ABNORMAL HIGH (ref 3.5–5.2)
Sodium: 139 mmol/L (ref 134–144)
Total Protein: 6.9 g/dL (ref 6.0–8.5)

## 2020-07-02 LAB — CBC/DIFF AMBIGUOUS DEFAULT
Basophils Absolute: 0 10*3/uL (ref 0.0–0.2)
Basos: 0 %
EOS (ABSOLUTE): 0 10*3/uL (ref 0.0–0.4)
Eos: 0 %
Hematocrit: 36.9 % (ref 34.0–46.6)
Hemoglobin: 12.2 g/dL (ref 11.1–15.9)
Immature Grans (Abs): 0 10*3/uL (ref 0.0–0.1)
Immature Granulocytes: 0 %
Lymphocytes Absolute: 1.5 10*3/uL (ref 0.7–3.1)
Lymphs: 24 %
MCH: 31.9 pg (ref 26.6–33.0)
MCHC: 33.1 g/dL (ref 31.5–35.7)
MCV: 97 fL (ref 79–97)
Monocytes Absolute: 0.4 10*3/uL (ref 0.1–0.9)
Monocytes: 6 %
Neutrophils Absolute: 4.2 10*3/uL (ref 1.4–7.0)
Neutrophils: 70 %
Platelets: 149 10*3/uL — ABNORMAL LOW (ref 150–450)
RBC: 3.82 x10E6/uL (ref 3.77–5.28)
RDW: 13.4 % (ref 11.7–15.4)
WBC: 6.1 10*3/uL (ref 3.4–10.8)

## 2020-07-02 LAB — PROTIME-INR
INR: 1 (ref 0.9–1.2)
Prothrombin Time: 10.8 s (ref 9.1–12.0)

## 2020-07-02 LAB — SPECIMEN STATUS REPORT

## 2020-07-02 LAB — AFP TUMOR MARKER: AFP, Serum, Tumor Marker: 3.7 ng/mL (ref 0.0–8.3)

## 2020-07-02 NOTE — Telephone Encounter (Signed)
Labs received and placed in Kristen's box to look at

## 2020-07-05 NOTE — Telephone Encounter (Signed)
Reviewed patient's labs completed 07/01/2020.  CBC: WBC 6.1, hemoglobin 12.2, hematocrit 36.9, MCV 97, MCH 31.9, MCHC 33.1, platelets 149 (L) CMP: Glucose 123 (H), BUN 14, creatinine 1.16 (H), sodium 139, potassium 5.9, chloride 102, calcium 8.9, total protein 6.9, albumin 4.0, total bilirubin 0.4, alk phos 133 (H), AST 29, ALT 19 INR: 1.0 AFP: 3.7  Overall, hemoglobin is stable/improved (hemoglobin 11.6 in June 2021).  Platelets low in the setting of cirrhosis and also stable/improved (platelets 127 in June 2021).  Creatinine has increased from 0.94 in June to 1.16.  Alk phos is also increased from 114 in June to 133.  Alicia: Please let patient know I have received and reviewed her labs.  Her hemoglobin is stable.  She has had mild worsening of her kidney function.  Her potassium was also high at 5.9.  Alk phos which is one of her liver enzymes is slightly elevated at 133.  She will need to follow-up with her PCP regarding worsening kidney function.  Needs to ensure she is drinking enough water daily to keep your urine pale yellow to clear. 2.  She will also need to follow-up with PCP regarding elevated potassium.  Limit foods that are high in potassium including: dried fruits, nuts, avocados, bran cereals, lima beans, molasses, spinach, tomatoes, broccoli, bananas, beets, carrots, cauliflower, potatoes, cantaloupe, kiwi, oranges, mangoes, ground beef, steak, pork, lamb.                    - Do we have a handout on foods that are high in potassium content we can send her?                   - She also needs to ensure she is not taking any sort of potassium supplementation. 3.  Regarding elevated alk phos, this may be secondary to worsening kidney function.  Could also be elevated if she did not complete labs fasting. 4.  Recommend repeating BMP this week, Wednesday or after FASTING (Dx: hyperkalemia)  5.  Repeat HFP in 4 weeks  FASTING (Dx: Elevated alk phos).

## 2020-07-12 NOTE — Telephone Encounter (Signed)
Kristine Rocha, can we follow-up on this? I do not see that it has been addressed.

## 2020-07-13 ENCOUNTER — Other Ambulatory Visit: Payer: Self-pay

## 2020-07-13 DIAGNOSIS — R748 Abnormal levels of other serum enzymes: Secondary | ICD-10-CM

## 2020-07-13 NOTE — Telephone Encounter (Signed)
Spoke with pt. Pt was notified that results were reviewed. Discussed results with pt. Pt will have a BMP (fasting) completed this Wednesday and HFP (fasting) in 4 weeks. Pt was advised to discuss worsening kidney function and elevated potassium. Pt isn't taking any oral potassium and mentioned her father had an elevated potassium. Discussed foods to avoid that are high in potassium and will mail pt information.

## 2020-07-13 NOTE — Telephone Encounter (Signed)
Noted  

## 2020-07-13 NOTE — Telephone Encounter (Signed)
Lmom, waiting on a return call. Lab orders placed for 3 weeks.

## 2020-07-23 DIAGNOSIS — Z299 Encounter for prophylactic measures, unspecified: Secondary | ICD-10-CM | POA: Diagnosis not present

## 2020-07-23 DIAGNOSIS — N390 Urinary tract infection, site not specified: Secondary | ICD-10-CM | POA: Diagnosis not present

## 2020-07-23 DIAGNOSIS — Z79899 Other long term (current) drug therapy: Secondary | ICD-10-CM | POA: Diagnosis not present

## 2020-07-23 DIAGNOSIS — R350 Frequency of micturition: Secondary | ICD-10-CM | POA: Diagnosis not present

## 2020-07-25 NOTE — Telephone Encounter (Signed)
Kristine Rocha, please remind patient about having labs completed. BMP was not completed. HFP should be completed this week (4 weeks from last labs). All labs should be completed fasting.

## 2020-07-27 NOTE — Telephone Encounter (Signed)
Lmom, waiting on a return call.  

## 2020-08-03 NOTE — Telephone Encounter (Signed)
Lmom, waiting on a return call. Mailing letter.

## 2020-08-11 DIAGNOSIS — M1712 Unilateral primary osteoarthritis, left knee: Secondary | ICD-10-CM | POA: Diagnosis not present

## 2020-08-18 ENCOUNTER — Encounter (INDEPENDENT_AMBULATORY_CARE_PROVIDER_SITE_OTHER): Payer: Medicare Other | Admitting: Ophthalmology

## 2020-08-19 ENCOUNTER — Ambulatory Visit (INDEPENDENT_AMBULATORY_CARE_PROVIDER_SITE_OTHER): Payer: Medicare Other | Admitting: Ophthalmology

## 2020-08-19 ENCOUNTER — Other Ambulatory Visit: Payer: Self-pay

## 2020-08-19 ENCOUNTER — Encounter (INDEPENDENT_AMBULATORY_CARE_PROVIDER_SITE_OTHER): Payer: Self-pay | Admitting: Ophthalmology

## 2020-08-19 DIAGNOSIS — H35372 Puckering of macula, left eye: Secondary | ICD-10-CM | POA: Insufficient documentation

## 2020-08-19 DIAGNOSIS — H353122 Nonexudative age-related macular degeneration, left eye, intermediate dry stage: Secondary | ICD-10-CM | POA: Diagnosis not present

## 2020-08-19 DIAGNOSIS — H353211 Exudative age-related macular degeneration, right eye, with active choroidal neovascularization: Secondary | ICD-10-CM | POA: Diagnosis not present

## 2020-08-19 MED ORDER — BEVACIZUMAB 2.5 MG/0.1ML IZ SOSY
2.5000 mg | PREFILLED_SYRINGE | INTRAVITREAL | Status: AC | PRN
  Administered 2020-08-19: 2.5 mg via INTRAVITREAL

## 2020-08-19 NOTE — Assessment & Plan Note (Signed)
No interval change.

## 2020-08-19 NOTE — Progress Notes (Addendum)
08/19/2020     CHIEF COMPLAINT Patient presents for Retina Follow Up (3 MO FU OD, POSS AVASTIN OD///Pt reports stable vision OU. Pt denies any new F/F, pain or pressure OU. ///Last A1C: 6.4 taken 06/2020///Last BS: 132 yesterday)   HISTORY OF PRESENT ILLNESS: Kristine Rocha is a 65 y.o. female who presents to the clinic today for:   HPI    Retina Follow Up    Patient presents with  Wet AMD.  In both eyes.  This started 3 months ago.  Severity is mild.  Duration of 3 months.  Since onset it is stable. Additional comments: 3 MO FU OD, POSS AVASTIN OD   Pt reports stable vision OU. Pt denies any new F/F, pain or pressure OU.    Last A1C: 6.4 taken 06/2020   Last BS: 132 yesterday       Last edited by Nichola Sizer D on 08/19/2020 10:46 AM. (History)      Referring physician: Glenda Chroman, MD [place],  [place] [address]  HISTORICAL INFORMATION:   Selected notes from the [facility]: No current outpatient medications on file. (Ophthalmic Drugs)   No current facility-administered medications for this visit. (Ophthalmic Drugs)   Current Outpatient Medications (Other)  Medication Sig  . allopurinol (ZYLOPRIM) 300 MG tablet Take 300 mg by mouth every evening.   Marland Kitchen atorvastatin (LIPITOR) 10 MG tablet Take 10 mg by mouth every evening.   . cetirizine (ZYRTEC) 10 MG tablet Take 10 mg by mouth daily.  . Cyanocobalamin (VITAMIN B-12 PO) Take by mouth daily.  . diphenhydrAMINE (BENADRYL) 25 MG tablet Take 25 mg by mouth at bedtime. As needed  . hyoscyamine (LEVSIN SL) 0.125 MG SL tablet Place under the tongue 3 (three) times daily as needed.  Marland Kitchen levothyroxine (SYNTHROID) 88 MCG tablet Take 88 mcg by mouth daily.  . Melatonin 10 MG TABS Take 10 mg by mouth at bedtime.   . metFORMIN (GLUCOPHAGE-XR) 500 MG 24 hr tablet Take 500 mg by mouth every evening.   . metoprolol succinate (TOPROL-XL) 100 MG 24 hr tablet Take 100 mg by mouth daily.  *Hold for low blood pressure*  . Multiple Vitamins-Minerals (PRESERVISION AREDS 2 PO) Take 1 tablet by mouth 2 (two) times daily.  Marland Kitchen omeprazole (PRILOSEC) 40 MG capsule Take 1 capsule (40 mg total) by mouth 2 (two) times daily before a meal.  . ONETOUCH VERIO test strip   . OVER THE COUNTER MEDICATION OTC Iron daily  . tiZANidine (ZANAFLEX) 4 MG tablet Take 4 mg by mouth 3 (three) times daily as needed for muscle spasms.   . Vitamin D, Ergocalciferol, (DRISDOL) 1.25 MG (50000 UNIT) CAPS capsule Take 50,000 Units by mouth every 7 (seven) days.  Marland Kitchen VITAMIN E PO Take 1 capsule by mouth every evening.   No current facility-administered medications for this visit. (Other)      REVIEW OF SYSTEMS:    ALLERGIES Allergies  Allergen Reactions  . Amoxicillin Hives, Shortness Of Breath and Rash    Did it involve swelling of the face/tongue/throat, SOB, or low BP? Yes Did it involve sudden or severe rash/hives, skin peeling, or any reaction on the inside of your mouth or nose? Unknown Did you need to seek medical attention at a hospital or doctor's office? Yes When did it last happen?~15-20 years ago If all above answers are "NO", may proceed with cephalosporin use. Marland Kitchen   Marland Kitchen  Ampicillin Hives, Shortness Of Breath and Rash    Did it involve swelling of the face/tongue/throat, SOB, or low BP? Yes Did it involve sudden or severe rash/hives, skin peeling, or any reaction on the inside of your mouth or nose? Unknown Did you need to seek medical attention at a hospital or doctor's office? Yes When did it last happen?~15-20 years ago If all above answers are "NO", may proceed with cephalosporin use. .  . Other     NON-STEROIDAL ANTI-INFLAMMATORY DRUG CAUSED GASTRIC BLEED  . Prevacid [Lansoprazole] Diarrhea    PAST MEDICAL HISTORY Past Medical History:  Diagnosis Date  . Asthma   . Cirrhosis ([place]) 06/2019   Immune to Hep A. Has completed Hep B vaccine (May 2021)  . Diabetes ([place])    . Essential hypertension   . GERD (gastroesophageal reflux disease)   . History of GI bleed   . Hypothyroidism   . Macular degeneration    wet in right, dry in left, has injections for this.  . Microcytic anemia   . Palpitations   . Peptic ulcer disease   . Postural orthostatic tachycardia syndrome    Mildly orthostatic results tilt table test 2005  - Dr. Caryl Comes  . Psoriasis   . Rheumatoid arthritis [facility])    Past Surgical History:  Procedure Laterality Date  . ABDOMINAL HERNIA REPAIR  15 years ago   umbilical with mesh  . ABDOMINAL HYSTERECTOMY  20 years ago   . BIOPSY  11/04/2019   Procedure: BIOPSY;  Surgeon: Daneil Dolin, MD;  Location: [facility];  Service: Endoscopy;;  duodenum gastric  . CATARACT EXTRACTION, BILATERAL Bilateral   . CHOLECYSTECTOMY    . COLONOSCOPY WITH ESOPHAGOGASTRODUODENOSCOPY (EGD)  03/2018   [facility]; indication:IDA; Normal exam.   . ESOPHAGOGASTRODUODENOSCOPY (EGD) WITH PROPOFOL N/A 11/04/2019   Procedure: ESOPHAGOGASTRODUODENOSCOPY (EGD) WITH PROPOFOL;  Surgeon: Daneil Dolin, MD; normal esophagus s/p empiric dilation, abnormal appearing gastric mucosa s/p biopsy, abnormal duodenal mucosa s/p biopsied, no ulcer, infiltrating process, or portal gastropathy.  Duodenal biopsies benign.  Gastric biopsy with reactive gastropathy with erosions, no H. pylori.   Marland Kitchen MALONEY DILATION N/A 11/04/2019   Procedure: Venia Minks DILATION;  Surgeon: Daneil Dolin, MD;  Location: [facility];  Service: Endoscopy;  Laterality: N/A;  . TILT TABLE STUDY  2005   [facility]  . TRIGGER FINGER RELEASE Right    2nd finger    FAMILY HISTORY Family History  Problem Relation Age of Onset  . Heart attack Father        Age 1  . Arrhythmia Sister        WPW s/p ablation- corrected at [facility]  . Alzheimer's disease Mother   . Pneumonia Mother        Aspiration  . Heart attack Brother   . Heart attack Maternal Grandfather   . Colon cancer Maternal Aunt         diagnosed at 49  . Breast cancer Maternal Grandmother     SOCIAL HISTORY Social History   Tobacco Use  . Smoking status: Never Smoker  . Smokeless tobacco: Never Used  Vaping Use  . Vaping Use: Never used  Substance Use Topics  . Alcohol use: No    Alcohol/week: 0.0 standard drinks  . Drug use: No         OPHTHALMIC EXAM: Base Eye Exam    Visual Acuity (ETDRS)      Right Left   Dist [place] 20/70 -2 20/20  Dist ph [place] NI        Tonometry (Tonopen, 10:51 AM)      Right Left   Pressure 11 9       Pupils      Pupils Dark Light Shape React APD   Right PERRL 3 2 Round Sluggish None   Left PERRL 3 3 Round Sluggish None       Visual Fields (Counting fingers)      Left Right    Full Full       Extraocular Movement      Right Left    Full Full       Neuro/Psych    Oriented x3: Yes   Mood/Affect: Normal       Dilation    Both eyes: 1.0% Mydriacyl, 2.5% Phenylephrine @ 10:51 AM        Slit Lamp and Fundus Exam    External Exam      Right Left   External Normal Normal       Slit Lamp Exam      Right Left   Lids/Lashes Normal Normal   Conjunctiva/Sclera White and quiet White and quiet   Cornea Clear Clear   Anterior Chamber Deep and quiet Deep and quiet   Iris Round and reactive, TI defect 530-630 Round and reactive   Lens Posterior chamber intraocular lens Posterior chamber intraocular lens   Anterior Vitreous Normal Normal       Fundus Exam      Right Left   Posterior Vitreous Posterior vitreous detachment Posterior vitreous detachment   Disc Normal Normal   C/D Ratio 0.45 0.5   Macula Retinal pigment epithelial atrophy in the FAZ, Advanced age related macular degeneration, Hard drusen, no exudates, no hemorrhage, Mottling, Retinal pigment epithelial mottling, Geographic atrophy, Soft drusen, no macular thickening Hard drusen, no hemorrhage, no macular thickening, no exudates   Vessels Normal Normal   Periphery Normal Normal          IMAGING  AND PROCEDURES  Imaging and Procedures for 08/19/20  OCT, Retina - OU - Both Eyes       Right Eye Quality was good. Scan locations included subfoveal. Central Foveal Thickness: 193. Progression has been stable. Findings include abnormal foveal contour, subretinal fluid, outer retinal atrophy, inner retinal atrophy, central retinal atrophy.   Left Eye Quality was good. Scan locations included subfoveal. Central Foveal Thickness: 283. Findings include epiretinal membrane, no SRF, no IRF.   Notes Central foveal atrophy with chronic residual subretinal fluid Yet stable overall at 41-month interval post Avastin.  No risk of scotoma enlargement.  OD.  OS with minor epiretinal membrane nasal to the fovea not center involved not impactful to vision       Intravitreal Injection, Pharmacologic Agent - OD - Right Eye       Time Out 08/19/2020. 11:36 AM. Confirmed correct patient, procedure, site, and patient consented.   Anesthesia Topical anesthesia was used. Anesthetic medications included Akten 3.5%.   Procedure Preparation included 10% betadine to eyelids, 5% betadine to ocular surface, Ofloxacin . A 30 gauge needle was used.   Injection:  2.5 mg Bevacizumab (AVASTIN) 2.5mg /0.104mL SOSY   NDC: M6102387, [id number]:[id number]   Route: Intravitreal, Site: Right Eye  Post-op Post injection exam found visual acuity of at least counting fingers. The patient tolerated the procedure well. There were no complications. The patient received written and verbal post procedure care education. Post injection medications were not given.  ASSESSMENT/PLAN:  Exudative age-related macular degeneration of right eye with active choroidal neovascularization (HCC) Stable OD at 23-month follow-up.  Diffuse atrophy limits acuity yet with good ambulatory vision remaining.  Intermediate stage nonexudative age-related macular degeneration of left eye No interval change.  Macular pucker,  left eye Noncentral involved, nasal to fovea, no impact on acuity observe      ICD-10-CM   1. Exudative age-related macular degeneration of right eye with active choroidal neovascularization (HCC)  H35.3211 OCT, Retina - OU - Both Eyes    Intravitreal Injection, Pharmacologic Agent - OD - Right Eye    bevacizumab (AVASTIN) SOSY 2.5 mg  2. Intermediate stage nonexudative age-related macular degeneration of left eye  H35.3122 OCT, Retina - OU - Both Eyes  3. Macular pucker, left eye  H35.372     1.  OD with goal with wet ARMD to prevent scotoma enlargement.  Foveal atrophy limits the acuity from dry AMD.  Subretinal fluid controlled and stabilized at 53-month follow-up interval post Avastin injection.  Repeat injection today  2.  Dilate OU again in 3 months.  3.  Ophthalmic Meds Ordered this visit:  Meds ordered this encounter  Medications  . bevacizumab (AVASTIN) SOSY 2.5 mg       Return in about 3 months (around 11/17/2020) for DILATE OU, AVASTIN OCT, OD.  There are no Patient Instructions on file for this visit.   Explained the diagnoses, plan, and follow up with the patient and they expressed understanding.  Patient expressed understanding of the importance of proper follow up care.   Clent Demark Ryer Asato M.D. Diseases & Surgery of the Retina and Vitreous Retina & Diabetic [place] 08/19/20     Abbreviations: M myopia (nearsighted); A astigmatism; H hyperopia (farsighted); P presbyopia; Mrx spectacle prescription;  CTL contact lenses; OD right eye; OS left eye; OU both eyes  XT exotropia; ET esotropia; PEK punctate epithelial keratitis; PEE punctate epithelial erosions; DES dry eye syndrome; MGD meibomian gland dysfunction; ATs artificial tears; PFAT's preservative free artificial tears; [place] nuclear sclerotic cataract; PSC posterior subcapsular cataract; ERM epi-retinal membrane; PVD posterior vitreous detachment; RD retinal detachment; DM diabetes mellitus; DR diabetic  retinopathy; NPDR non-proliferative diabetic retinopathy; PDR proliferative diabetic retinopathy; CSME clinically significant macular edema; DME diabetic macular edema; dbh dot blot hemorrhages; CWS cotton wool spot; POAG primary open angle glaucoma; C/D cup-to-disc ratio; HVF humphrey visual field; GVF goldmann visual field; OCT optical coherence tomography; IOP intraocular pressure; BRVO Branch retinal vein occlusion; CRVO central retinal vein occlusion; CRAO central retinal artery occlusion; BRAO branch retinal artery occlusion; RT retinal tear; SB scleral buckle; PPV pars plana vitrectomy; VH Vitreous hemorrhage; PRP panretinal laser photocoagulation; IVK intravitreal kenalog; VMT vitreomacular traction; MH Macular hole;  NVD neovascularization of the disc; NVE neovascularization elsewhere; AREDS age related eye disease study; ARMD age related macular degeneration; POAG primary open angle glaucoma; EBMD epithelial/anterior basement membrane dystrophy; ACIOL anterior chamber intraocular lens; IOL intraocular lens; PCIOL posterior chamber intraocular lens; Phaco/IOL phacoemulsification with intraocular lens placement; [place] photorefractive keratectomy; LASIK laser assisted in situ keratomileusis; HTN hypertension; DM diabetes mellitus; COPD chronic obstructive pulmonary disease

## 2020-08-19 NOTE — Assessment & Plan Note (Signed)
Noncentral involved, nasal to fovea, no impact on acuity observe

## 2020-08-19 NOTE — Assessment & Plan Note (Signed)
Stable OD at 30-month follow-up.  Diffuse atrophy limits acuity yet with good ambulatory vision remaining.

## 2020-10-12 DIAGNOSIS — M1009 Idiopathic gout, multiple sites: Secondary | ICD-10-CM | POA: Diagnosis not present

## 2020-10-12 DIAGNOSIS — M2550 Pain in unspecified joint: Secondary | ICD-10-CM | POA: Diagnosis not present

## 2020-10-12 DIAGNOSIS — R768 Other specified abnormal immunological findings in serum: Secondary | ICD-10-CM | POA: Diagnosis not present

## 2020-10-12 DIAGNOSIS — L409 Psoriasis, unspecified: Secondary | ICD-10-CM | POA: Diagnosis not present

## 2020-10-12 DIAGNOSIS — M150 Primary generalized (osteo)arthritis: Secondary | ICD-10-CM | POA: Diagnosis not present

## 2020-10-12 DIAGNOSIS — K7460 Unspecified cirrhosis of liver: Secondary | ICD-10-CM | POA: Diagnosis not present

## 2020-10-26 NOTE — Progress Notes (Signed)
Referring Provider: Glenda Chroman, MD Primary Care Physician:  Glenda Chroman, MD Primary GI Physician: Dr. Gala Romney  Chief Complaint  Patient presents with  . Cirrhosis  . Back Pain    Right side. Comes and goes    HPI:   Kristine Rocha is a 65 y.o. female presenting today for follow-up.  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 Extensive laboratory evaluation of cirrhosis with no significant findings aside from positive ANA. [facility]  Repeat EGD 11/04/2019 with normal-appearing esophagus s/p dilation, abnormal appearing stomach of uncertain significance, gastric mucosa with some plaque appearance in the antrum and prepyloric area, no ulcer or infiltrating process, no portal gastropathy, patent pylorus, abnormal duodenum of uncertain significance with prominent villi in the second and third portion of the duodenum s/p biopsy.  Duodenal biopsy was benign.  Gastric biopsy with reactive gastropathy with erosions, no H. pylori.  Recommended repeat EGD in 2 years.  Previously offered Givens capsule to complete GI evaluation of IDA, but patient declined.  Last seen in our office 04/29/2020.  From a cirrhosis standpoint, she was doing well.  She started hep B vaccination in May 2021.  GERD was well controlled on omeprazole 40 mg twice daily.  Denied overt GI bleeding.  She was taking OTC iron supplement and beef liver.  She had previously reported over the phone in June 2021 that she was having watery diarrhea after completing 2 rounds of antibiotics and prednisone.  Stated PCP had checked for C. difficile and this was negative.  Also reported they started her on Bentyl and hyoscyamine at that time.  By the time of her office visit, she reported bowel movements fluctuate to 0-5 loose BMs per day.  No watery stools.  No nocturnal stools.  No BRBPR or melena.   She is taking hyoscyamine typically 1-2 times a day and Bentyl at night if needed.  Diarrhea worsened by milk.  Query postinfectious IBS and dietary intolerances contributing to diarrhea.  Recommended stopping Bentyl and using hyoscyamine once every morning increasing as needed, lactose-free diet, low-fat diet, monitor for worsening symptoms.  We also arranged abdominal ultrasound and she was due routine cirrhosis labs in December.  Again discussed the possibility of Givens capsule, but patient declined.  She would continue omeprazole 40 mg twice daily, oral iron, and follow-up in 6 months.  Labs completed 07/01/2020.  Hemoglobin improved to 12.2.  Platelets slightly low at 149.  Alk phos slightly elevated at 133, AST, ALT, total bilirubin, INR, AFP within normal limits.  MELD 8.  I have recommended repeating HFP in 4 weeks fasting, but this was not completed as we are unable to reach patient.  Letter was mailed.  Abdominal US 05/06/20: Cirrhosis with no focal liver lesions, 2.1 cm right renal cyst for which she was advised to f/u with PCP.    Today: 2 weeks of dull ache in her back at her right rib cage.  Pain will radiate down the right side of her back to the top of her butt when walking or going upstairs.  Some days are better than others.  No association with meals or BMs.  Denies associated nausea, vomiting, constipation, or diarrhea. Hasn't fallen or strained herself. No fever or chills. Spoke with PCP about this and he advised to follow-up with [place]. Urine was checked for protein. Not sure about  infection. Has scoliosis.  Also reports history of kidney stones.  She is not interested in any sort of imaging at this time. No dysuria. May urinate normally or can go a long time without urination.   Blood sugars have been very high. Metformin was increased and she was started on glipizide. Had been eating "a lot of the wrong things". Hemoglobin A1C was 9.2. Trying to do better with diet. Eating more chicken  and vegetables. Limiting red meat and pork. Just started yesterday.   Cirrhosis: No swelling in the lower extremities or abdomen. She has gained weight due to poor dietary habits.  Denies jaundice, scleral icterus, or bleeding.  She does have some bruising. No alcohol. Hep B vaccine is complete.   GERD: Well controlled on omeprazole BID.  No dysphagia.  Loose stools: Using hyoscyamine only if eating something spicy because this will contribute to loose stools.  In general, she has been avoiding spicy foods and is having soft formed BMs daily.   IDA: Had been taking iron off an on rather than daily. Had labs with Dr. Lenna Gilford a few weeks ago and hemoglobin was 11. No brbpr or melena. No other overt bleeding. Doesn't want a Givens.  Discussed updating iron panel, but she wants to wait.  No NSAIDs.   Past Medical History:  Diagnosis Date  . Asthma   . Cirrhosis ([place]) 06/2019   Immune to Hep A. Has completed Hep B vaccine (May 2021)  . Diabetes ([place])   . Essential hypertension   . GERD (gastroesophageal reflux disease)   . History of GI bleed   . Hypothyroidism   . Macular degeneration    wet in right, dry in left, has injections for this.  . Microcytic anemia   . Palpitations   . Peptic ulcer disease   . Postural orthostatic tachycardia syndrome    Mildly orthostatic results tilt table test 2005  - Dr. Caryl Comes  . Psoriasis   . Rheumatoid arthritis [facility])     Past Surgical History:  Procedure Laterality Date  . ABDOMINAL HERNIA REPAIR  15 years ago   umbilical with mesh  . ABDOMINAL HYSTERECTOMY  20 years ago   . BIOPSY  11/04/2019   Procedure: BIOPSY;  Surgeon: Daneil Dolin, MD;  Location[facility];  Service: Endoscopy;;  duodenum gastric  . CATARACT EXTRACTION, BILATERAL Bilateral   . CHOLECYSTECTOMY    . COLONOSCOPY WITH ESOPHAGOGASTRODUODENOSCOPY (EGD)  03/2018   [facility]; indication:IDA; Normal exam.   . ESOPHAGOGASTRODUODENOSCOPY (EGD) WITH PROPOFOL N/A 11/04/2019    Procedure: ESOPHAGOGASTRODUODENOSCOPY (EGD) WITH PROPOFOL;  Surgeon: Daneil Dolin, MD; normal esophagus s/p empiric dilation, abnormal appearing gastric mucosa s/p biopsy, abnormal duodenal mucosa s/p biopsied, no ulcer, infiltrating process, or portal gastropathy.  Duodenal biopsies benign.  Gastric biopsy with reactive gastropathy with erosions, no H. pylori.   Marland Kitchen MALONEY DILATION N/A 11/04/2019   Procedure: Venia Minks DILATION;  Surgeon: Daneil Dolin, MD;  Location: [facility];  Service: Endoscopy;  Laterality: N/A;  . TILT TABLE STUDY  2005   [facility]  . TRIGGER FINGER RELEASE Right    2nd finger    Current Outpatient Medications  Medication Sig Dispense Refill  . allopurinol (ZYLOPRIM) 300 MG tablet Take 300 mg by mouth every evening.     Marland Kitchen Apremilast (OTEZLA) 30 MG TABS Take 1 tablet by mouth daily.    Marland Kitchen atorvastatin (LIPITOR) 10 MG tablet Take 10 mg by mouth every evening.     Marland Kitchen  cetirizine (ZYRTEC) 10 MG tablet Take 10 mg by mouth daily.    . Cyanocobalamin (VITAMIN B-12 PO) Take by mouth daily.    . diphenhydrAMINE (BENADRYL) 25 MG tablet Take 25 mg by mouth at bedtime. As needed    . Ferrous Sulfate (IRON) 325 (65 Fe) MG TABS Take 1 tablet by mouth daily.    Marland Kitchen glipiZIDE (GLUCOTROL XL) 5 MG 24 hr tablet Take 5 mg by mouth daily.    . hyoscyamine (LEVSIN SL) 0.125 MG SL tablet Place under the tongue 3 (three) times daily as needed.    Marland Kitchen levothyroxine (SYNTHROID) 88 MCG tablet Take 88 mcg by mouth daily.    . Melatonin 10 MG TABS Take 10 mg by mouth at bedtime.     . metFORMIN (GLUCOPHAGE-XR) 500 MG 24 hr tablet Take 500 mg by mouth 2 (two) times daily.  0  . metoprolol succinate (TOPROL-XL) 100 MG 24 hr tablet Take 100 mg by mouth daily. *Hold for low blood pressure*    . Multiple Vitamins-Minerals (PRESERVISION AREDS 2 PO) Take 1 tablet by mouth 2 (two) times daily.    Marland Kitchen omeprazole (PRILOSEC) 40 MG capsule Take 1 capsule (40 mg total) by mouth 2 (two) times daily before a meal.  60 capsule 11  . ONETOUCH VERIO test strip   0  . OVER THE COUNTER MEDICATION OTC Iron daily    . tiZANidine (ZANAFLEX) 4 MG tablet Take 4 mg by mouth 3 (three) times daily as needed for muscle spasms.     . Vitamin D, Ergocalciferol, (DRISDOL) 1.25 MG (50000 UNIT) CAPS capsule Take 50,000 Units by mouth every 7 (seven) days.    Marland Kitchen VITAMIN E PO Take 1 capsule by mouth every evening.     No current facility-administered medications for this visit.    Allergies as of 10/28/2020 - Review Complete 10/28/2020  Allergen Reaction Noted  . Amoxicillin Hives, Shortness Of Breath, and Rash 11/19/2014  . Ampicillin Hives, Shortness Of Breath, and Rash 11/19/2014  . Dust mite extract  10/28/2020  . Molds & smuts  10/28/2020  . Other  11/19/2014  . Prevacid [lansoprazole] Diarrhea 06/24/2019    Family History  Problem Relation Age of Onset  . Heart attack Father        Age 76  . Arrhythmia Sister        WPW s/p ablation- corrected at [facility]  . Alzheimer's disease Mother   . Pneumonia Mother        Aspiration  . Heart attack Brother   . Heart attack Maternal Grandfather   . Colon cancer Maternal Aunt        diagnosed at 42  . Breast cancer Maternal Grandmother     Social History   Socioeconomic History  . Marital status: Married    Spouse name: Not on file  . Number of children: Not on file  . Years of education: Not on file  . Highest education level: Not on file  Occupational History  . Not on file  Tobacco Use  . Smoking status: Never Smoker  . Smokeless tobacco: Never Used  Vaping Use  . Vaping Use: Never used  Substance and Sexual Activity  . Alcohol use: No    Alcohol/week: 0.0 standard drinks  . Drug use: No  . Sexual activity: Yes  Other Topics Concern  . Not on file  Social History Narrative  . Not on file   Social Determinants of Health   Financial Resource Strain: Not  on file  Food Insecurity: Not on file  Transportation Needs: Not on file  Physical  Activity: Not on file  Stress: Not on file  Social Connections: Not on file    Review of Systems: Gen: Denies fever, chills, cold or flulike symptoms, presyncope, syncope. CV: Denies chest pain or palpitations. Resp: Denies dyspnea or cough. GI: See HPI Derm: She has psoriasis. Heme: See HPI  Physical Exam: BP 105/64   Pulse (!) 59   Temp (!) 96.9 F (36.1 C) (Temporal)   Ht 5' (1.524 m)   Wt 202 lb (91.6 kg)   BMI 39.45 kg/m  General:   Alert and oriented. No distress noted. Pleasant and cooperative.  Head:  Normocephalic and atraumatic. Eyes:  Conjuctiva clear without scleral icterus. Heart:  S1, S2 present without murmurs appreciated. Lungs:  Clear to auscultation bilaterally. No wheezes, rales, or rhonchi. No distress.  Abdomen:  +BS, soft, and non-distended. Mild TTP in the RUQ. No rebound or guarding. No HSM or masses noted.  Back: Right sided CVA tenderness.  Msk:  Symmetrical without gross deformities. Normal posture. Extremities:  Without edema. Neurologic:  Alert and  oriented x4 Psych: Normal mood and affect.   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  She is presenting today for follow-up.   NASH Cirrhosis: Remains well compensated.  Meld 8 based on labs in December 2021.  EGD up-to-date in April 2021 with no esophageal varices.  Due for repeat in 2023.  She is due for ultrasound for [place] screening at this time.  Due for routine cirrhosis labs in 8 weeks.  Reports she has completed hepatitis B vaccination.  GERD: Well-controlled on omeprazole 40 mg twice daily.  No dysphagia. Advise she continue her current medications.  IDA: History of IDA in 2019.  EGD and colonoscopy [facility] in September 2019 which were normal.  Found to have decline in hemoglobin to 8.8 and ferritin 11 in October 2020.  Due to  dyspepsia, nausea, GERD, and dysphagia, she was started on omeprazole which was increased to twice daily in February 2021.  EGD and April 2021 with normal esophagus, abnormal appearing gastric mucosa s/p biopsy, abnormal duodenal mucosa s/p biopsy.  Duodenal biopsy benign.  Gastric biopsy with reactive gastropathy with erosions, H. pylori negative.  She continues to deny any overt GI bleeding and remains on PPI twice daily.  She had been taking oral iron daily with hemoglobin improved to 12.2 in September 2021.  She tells me that she recently had labs with Dr. Lenna Gilford with hemoglobin of 11.  Also reports she had not been taking iron routinely, but she is now.  Denies NSAIDs.    Etiology of IDA has not been identified.  It is possible that she had more extensive gastric erosions or PUD that was not appreciated on EGD as she had been on omeprazole twice daily 2 months prior to endoscopic evaluation.  Additionally, cannot rule out small bowel etiology. We discussed possibility of Givens capsule again today, but she again declines. Discussed updating iron panel. She request I wait for a few weeks.   Right flank pain: Patient reports 2-week history of dull pain in her right back at the level of her lower rib cage that radiates down the right side of her back to the top of her buttocks if walking or going upstairs.  Denies any radiation to her abdomen. Denies  association with meals or bowel movements.  Denies falling or straining herself.  Denies fever, chills, nausea, vomiting, or dysuria. History of cholecystectomy. On exam, she has right-sided CVA tenderness.  Also with mild TTP in the RUQ.   Suspect this is likely musculoskeletal in etiology; however, cannot rule out nephrolithiasis, complicated UTI, pyelonephritis. RUQ tenderness is non-specific as she has no pain in this area aside from palpation. She is due for RUQ ultrasound for [place] screening in the setting of NASH cirrhosis.  Discussed possibility of CT  considering CVA tenderness, but patient declined.  I will check UA and urine culture and update LFTs and kidney function.    Plan: 1.  UA, urine culture, CMP fasting.  2.  RUQ ultrasound.  3.  If labs and RUQ ultrasound are unrevealing, patient will follow up with PCP for further evaluation of right-sided pain.  4.  Continue omeprazole 40 mg twice daily.  5.  Continue to avoid all NSAIDs.  6.  Resume over-the-counter iron supplement daily.  7.  Request recent labs from Dr. Lenna Gilford for review.  8.  Discussed the importance of keeping diabetes under good control.  9.  Recommended 1-2# weight loss per week until ideal body weight through exercise & diet.  Low fat/cholesterol diet.    Avoid sweets, sodas, fruit juices, sweetened beverages like tea, etc.  Gradually increase exercise from 15 min daily up to 1 hr per day 5 days/week.  Continue to avoid all alcohol.  10.  Requested she monitor for swelling in her abdomen, lower extremities, change in mental status or confusion, yellowing of eyes or skin, bruising or bleeding and let us know if this occurs.  11.  We will arrange CBC, CMP, INR, AFP, iron panel with ferritin, B12, and folate in 8 weeks.  12.  Follow-up in 6 months or sooner if needed.    Aliene Altes, PA-C [facility] 10/28/2020

## 2020-10-27 DIAGNOSIS — J069 Acute upper respiratory infection, unspecified: Secondary | ICD-10-CM | POA: Diagnosis not present

## 2020-10-27 DIAGNOSIS — E1165 Type 2 diabetes mellitus with hyperglycemia: Secondary | ICD-10-CM | POA: Diagnosis not present

## 2020-10-27 DIAGNOSIS — I10 Essential (primary) hypertension: Secondary | ICD-10-CM | POA: Diagnosis not present

## 2020-10-27 DIAGNOSIS — Z789 Other specified health status: Secondary | ICD-10-CM | POA: Diagnosis not present

## 2020-10-27 DIAGNOSIS — Z299 Encounter for prophylactic measures, unspecified: Secondary | ICD-10-CM | POA: Diagnosis not present

## 2020-10-27 DIAGNOSIS — E039 Hypothyroidism, unspecified: Secondary | ICD-10-CM | POA: Diagnosis not present

## 2020-10-28 ENCOUNTER — Encounter: Payer: Self-pay | Admitting: *Deleted

## 2020-10-28 ENCOUNTER — Encounter: Payer: Self-pay | Admitting: Gastroenterology

## 2020-10-28 ENCOUNTER — Other Ambulatory Visit: Payer: Self-pay

## 2020-10-28 ENCOUNTER — Ambulatory Visit: Payer: Medicare Other | Admitting: Gastroenterology

## 2020-10-28 VITALS — BP 105/64 | HR 59 | Temp 96.9°F | Ht 60.0 in | Wt 202.0 lb

## 2020-10-28 DIAGNOSIS — R109 Unspecified abdominal pain: Secondary | ICD-10-CM | POA: Diagnosis not present

## 2020-10-28 DIAGNOSIS — K7460 Unspecified cirrhosis of liver: Secondary | ICD-10-CM

## 2020-10-28 DIAGNOSIS — R10811 Right upper quadrant abdominal tenderness: Secondary | ICD-10-CM | POA: Diagnosis not present

## 2020-10-28 DIAGNOSIS — K219 Gastro-esophageal reflux disease without esophagitis: Secondary | ICD-10-CM

## 2020-10-28 DIAGNOSIS — D509 Iron deficiency anemia, unspecified: Secondary | ICD-10-CM | POA: Diagnosis not present

## 2020-10-28 DIAGNOSIS — E1165 Type 2 diabetes mellitus with hyperglycemia: Secondary | ICD-10-CM | POA: Diagnosis not present

## 2020-10-28 NOTE — Patient Instructions (Addendum)
Please have labs and urine analysis completed at [place].   We will arrange for you to have an ultrasound of your liver.  Continue omeprazole 40 mg twice daily 30 minutes before breakfast and dinner.  Continue to avoid all NSAID products.  Please resume over-the-counter iron supplement daily.  We will plan to recheck your labs, iron levels, vitamin B12, and folate along with routine cirrhosis labs in 8 weeks.   I am also requesting recent labs from Dr. Lenna Gilford to review.  Monitor for swelling in your abdomen or lower extremities, changes in mental status with confusion, yellowing of the eyes or skin, bruising or bleeding and let us know if this occurs.  As I discussed it is important to keep your diabetes under good control and to work on weight loss.   Recommend 1-2# weight loss per week until ideal body weight through exercise & diet. Low fat/cholesterol diet.   Avoid sweets, sodas, fruit juices, sweetened beverages like tea, etc. Gradually increase exercise from 15 min daily up to 1 hr per day 5 days/week.  Continue to avoid all alcohol.  We will plan to see back in the office in 6 months or sooner if needed.   It was great seeing today!  Aliene Altes, PA-C [facility]

## 2020-10-29 ENCOUNTER — Encounter: Payer: Self-pay | Admitting: Gastroenterology

## 2020-11-02 ENCOUNTER — Ambulatory Visit (HOSPITAL_COMMUNITY)
Admission: RE | Admit: 2020-11-02 | Discharge: 2020-11-02 | Disposition: A | Discharge status: Home / Self Care | Payer: Medicare Other | Source: Ambulatory Visit | Attending: Gastroenterology | Admitting: Gastroenterology

## 2020-11-02 DIAGNOSIS — K7460 Unspecified cirrhosis of liver: Secondary | ICD-10-CM | POA: Diagnosis not present

## 2020-11-02 DIAGNOSIS — R10811 Right upper quadrant abdominal tenderness: Secondary | ICD-10-CM | POA: Diagnosis not present

## 2020-11-02 IMAGING — US US ABDOMEN LIMITED RUQ/ASCITES
1 series · 14 of 25 positions shown · non-contrast
Comparison: Ultrasound abdomen [DATE]

CLINICAL DATA: Cirrhosis

Right upper quadrant tenderness
EXAM:
ULTRASOUND ABDOMEN LIMITED RIGHT UPPER QUADRANT

[Series 1: us abdomen limited ruq (liver/gb) · 14 of 52 slices shown]
[im 1/52]
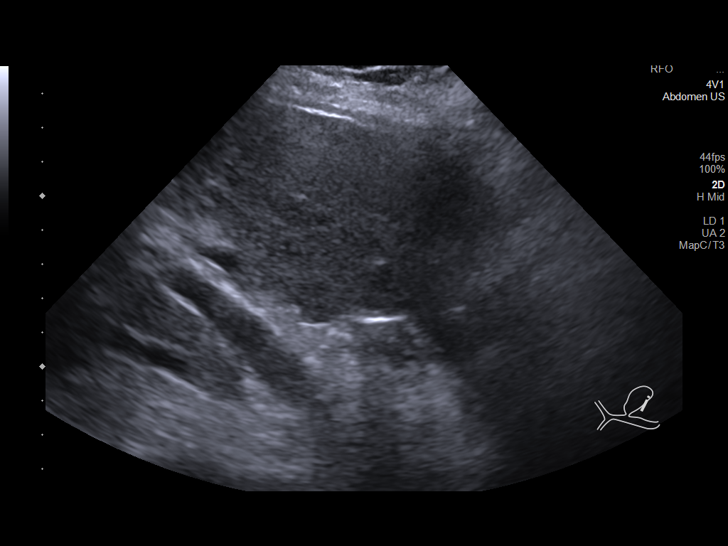
[im 5/52]
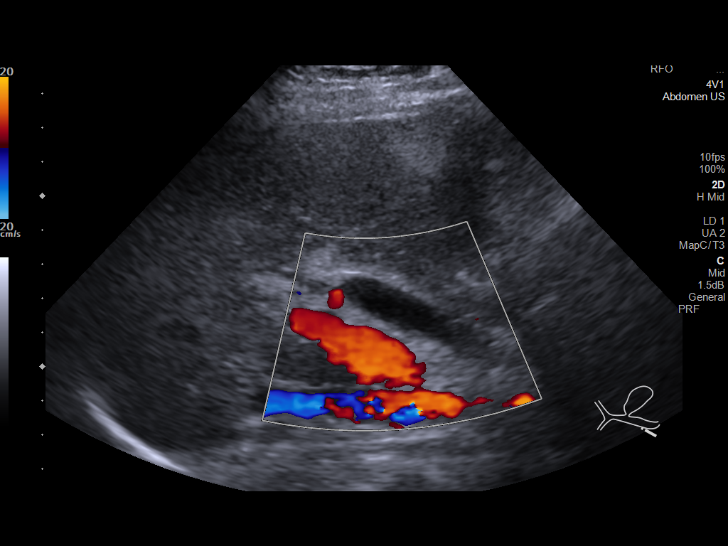
[im 9/52]
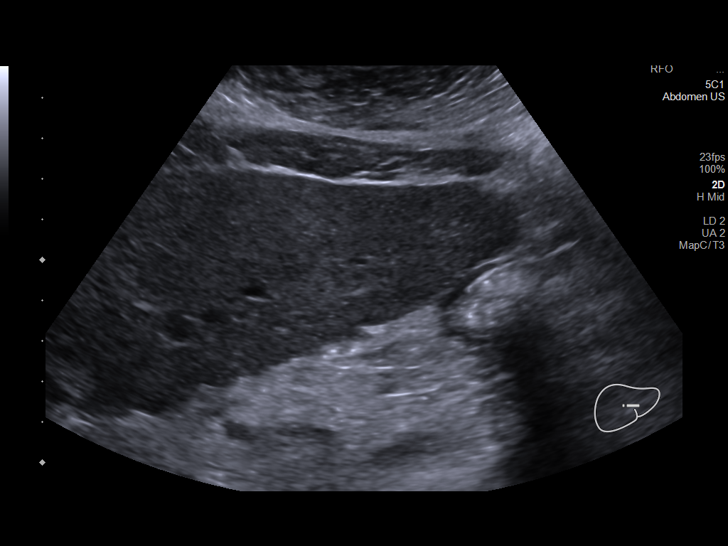
[im 13/52]
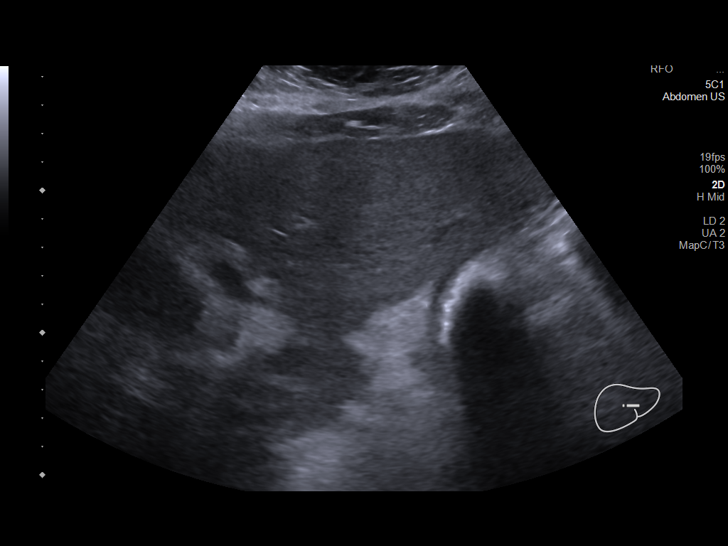
[im 18/52]
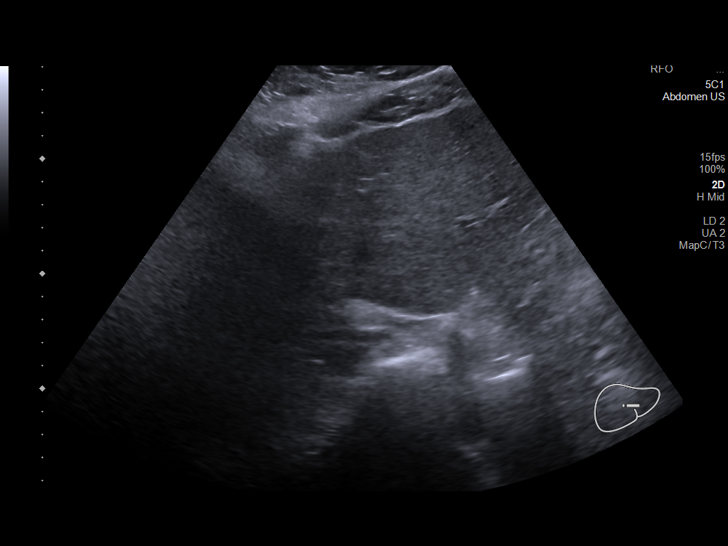
[im 20/52]
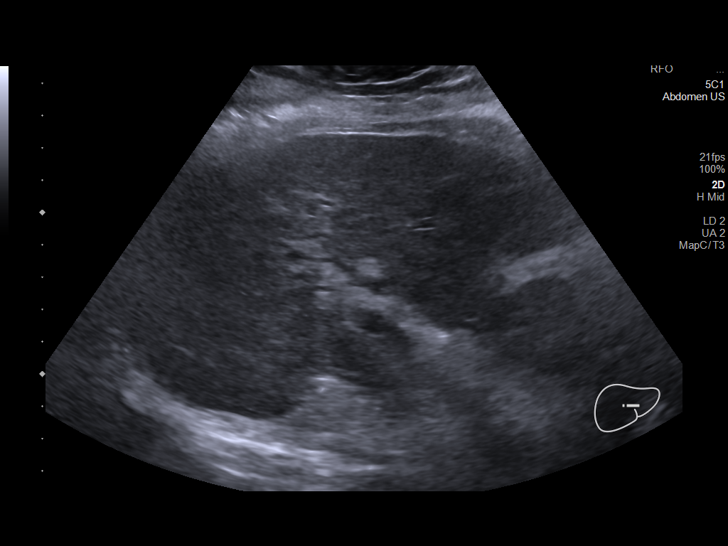
[im 24/52]
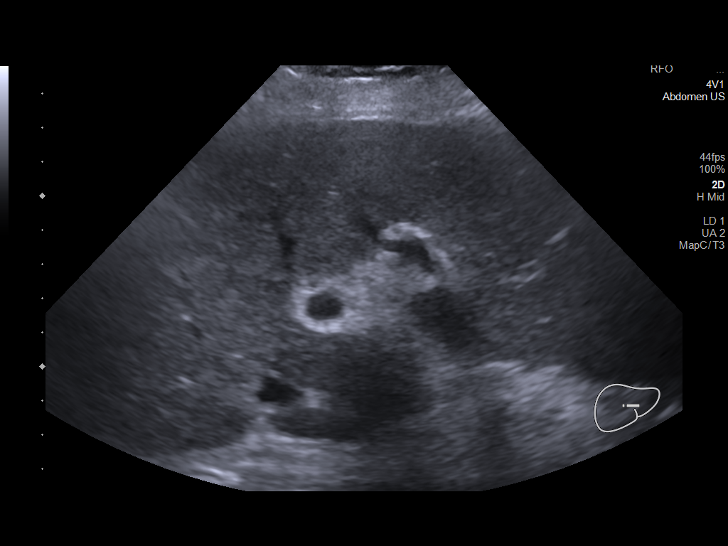
[im 28/52]
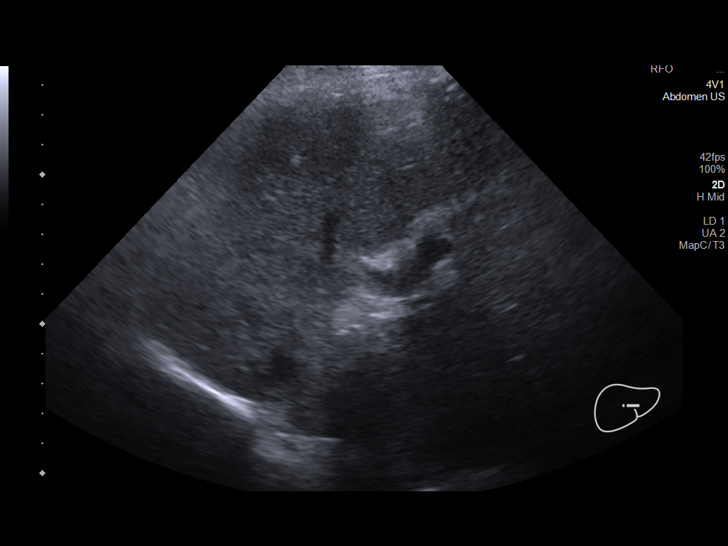
[im 32/52]
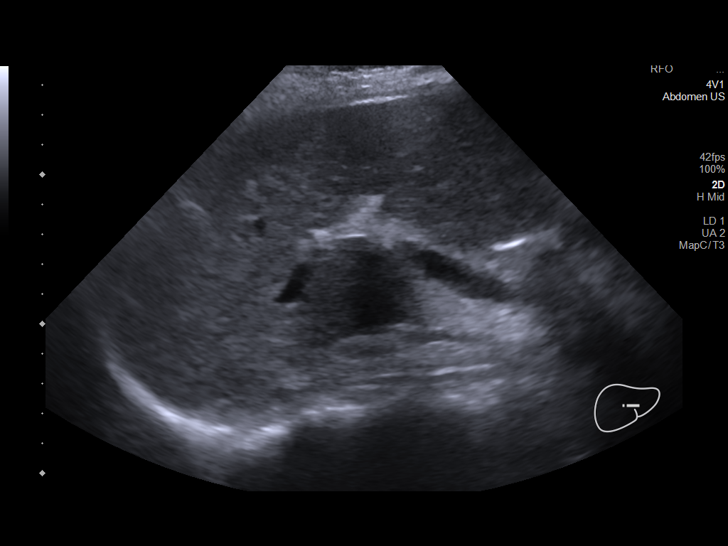
[im 35/52]
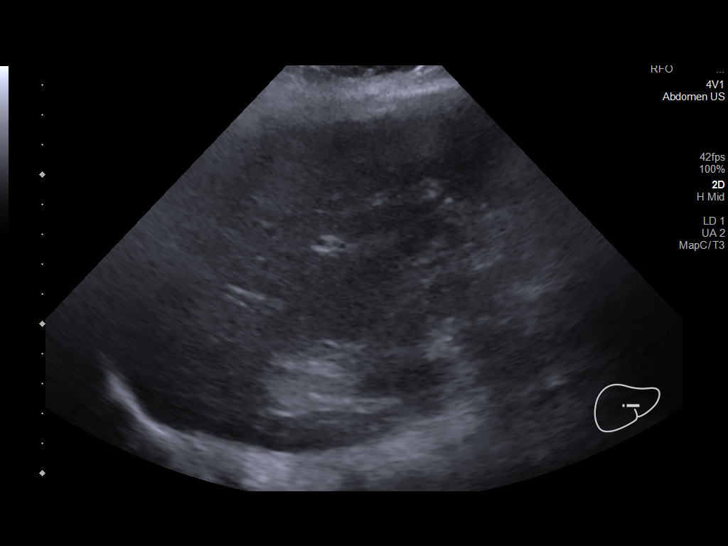
[im 39/52]
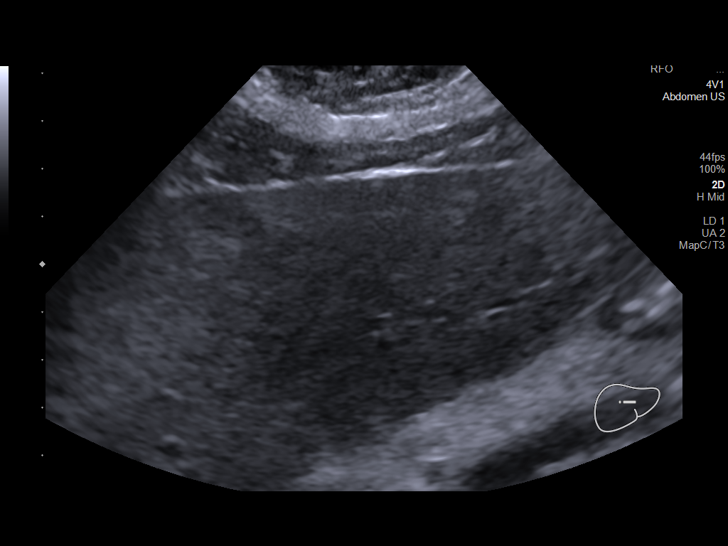
[im 43/52]
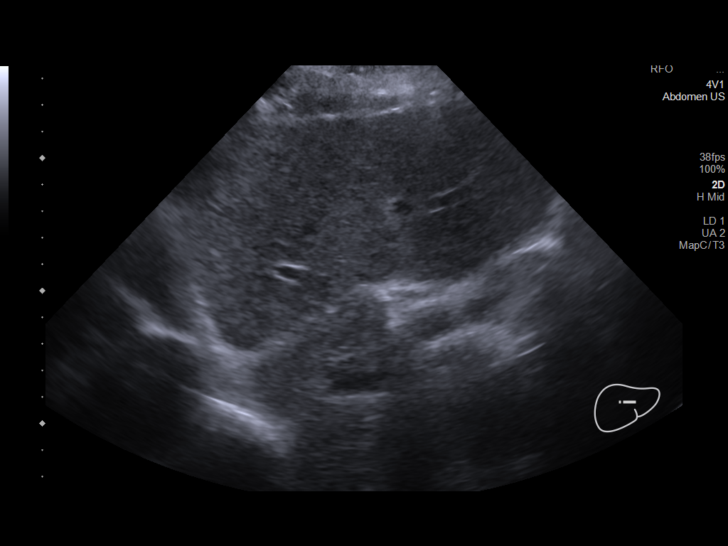
[im 47/52]
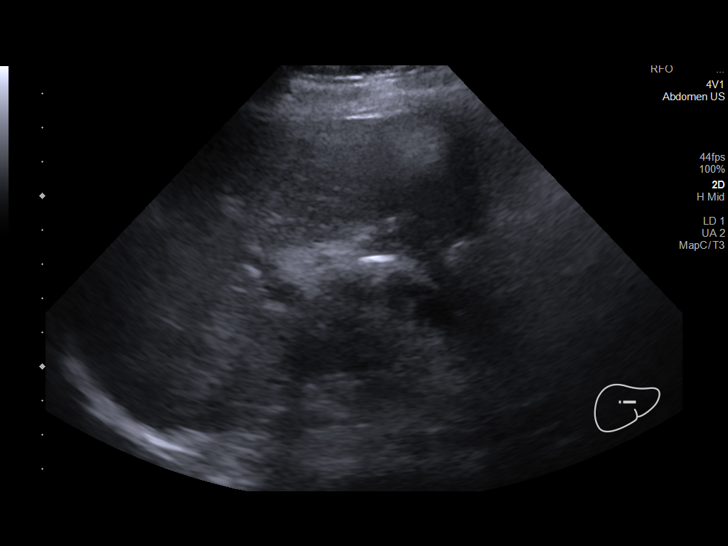
[im 52/52]
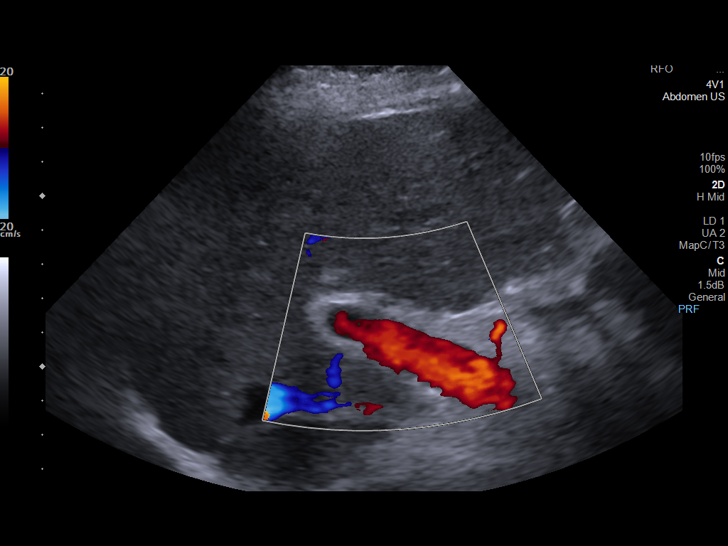

[14 of 25 positions shown; findings below may reference images not displayed]

FINDINGS: Gallbladder:

Status post cholecystectomy

Common bile duct:

Diameter: 10 mm

Liver:

Mildly nodular hepatic contours. Parenchymal echogenicity within
normal limits. No AGMAR lesion. Portal vein is patent on color
Doppler imaging with normal direction of blood flow towards the
liver.

Other: None.
IMPRESSION: Cirrhotic liver morphology without hepatic lesions.

## 2020-11-04 ENCOUNTER — Other Ambulatory Visit: Payer: Self-pay

## 2020-11-04 DIAGNOSIS — K7460 Unspecified cirrhosis of liver: Secondary | ICD-10-CM

## 2020-11-04 DIAGNOSIS — D509 Iron deficiency anemia, unspecified: Secondary | ICD-10-CM

## 2020-11-09 ENCOUNTER — Telehealth: Payer: Self-pay | Admitting: Internal Medicine

## 2020-11-09 NOTE — Telephone Encounter (Signed)
Pt returning call. [phone]

## 2020-11-09 NOTE — Telephone Encounter (Signed)
Returned call.  Results and recommendations given.  See result note.

## 2020-11-11 DIAGNOSIS — E1165 Type 2 diabetes mellitus with hyperglycemia: Secondary | ICD-10-CM | POA: Diagnosis not present

## 2020-11-11 DIAGNOSIS — Z0000 Encounter for general adult medical examination without abnormal findings: Secondary | ICD-10-CM | POA: Diagnosis not present

## 2020-11-11 DIAGNOSIS — Z7189 Other specified counseling: Secondary | ICD-10-CM | POA: Diagnosis not present

## 2020-11-11 DIAGNOSIS — Z79899 Other long term (current) drug therapy: Secondary | ICD-10-CM | POA: Diagnosis not present

## 2020-11-11 DIAGNOSIS — Z299 Encounter for prophylactic measures, unspecified: Secondary | ICD-10-CM | POA: Diagnosis not present

## 2020-11-11 DIAGNOSIS — R5383 Other fatigue: Secondary | ICD-10-CM | POA: Diagnosis not present

## 2020-11-11 DIAGNOSIS — E7800 Pure hypercholesterolemia, unspecified: Secondary | ICD-10-CM | POA: Diagnosis not present

## 2020-11-11 DIAGNOSIS — E039 Hypothyroidism, unspecified: Secondary | ICD-10-CM | POA: Diagnosis not present

## 2020-11-11 DIAGNOSIS — I10 Essential (primary) hypertension: Secondary | ICD-10-CM | POA: Diagnosis not present

## 2020-11-20 DIAGNOSIS — E1165 Type 2 diabetes mellitus with hyperglycemia: Secondary | ICD-10-CM | POA: Diagnosis not present

## 2020-11-23 ENCOUNTER — Telehealth: Payer: Self-pay | Admitting: Gastroenterology

## 2020-11-23 NOTE — Telephone Encounter (Signed)
Received and reviewed labs completed with PCP dated 10/12/20.  CBC: WBC 8.7, hemoglobin 11.0 (L), hematocrit 33.8 (L), MCV 90, MCH 29.3, MCHC 32.5, platelets 201. CMP: Calcium 8.9, total protein 6.8, ALT 35 (H), AST 40, alk phos 105, total bilirubin 0.3, albumin 4.1, glucose 200 (H), potassium 5.7 (H), sodium 132 (L), creatinine 1.08 (H), BUN 16. CRP: 2 Uric acid, serum: 2.8 (L)  Hemoglobin declined to 11.0 from 12.2 in December 2021.  Patient notified me of this at her last office visit in April 2022.  I requested labs for confirmation. Notably, at her OV, she was without overt GI bleeding and reported she had not been taking iron routinely. She was to resume iron daily and we were updating labs in 8 weeks.  This was arranged at the time of her office visit. See OV for details.

## 2020-11-24 ENCOUNTER — Encounter (INDEPENDENT_AMBULATORY_CARE_PROVIDER_SITE_OTHER): Payer: Medicare Other | Admitting: Ophthalmology

## 2020-11-26 NOTE — Telephone Encounter (Signed)
Lmom, waiting on a return call.  

## 2020-11-27 NOTE — Telephone Encounter (Signed)
FYI Spoke with pt. Pt is aware that labs from PCP were received from 10/12/20. Pt reports that she is taking iron daily, B12 daily and will complete labs June 2022 as directed at office visit.

## 2020-11-27 NOTE — Telephone Encounter (Signed)
Noted  

## 2020-11-30 ENCOUNTER — Encounter (INDEPENDENT_AMBULATORY_CARE_PROVIDER_SITE_OTHER): Payer: Self-pay | Admitting: Ophthalmology

## 2020-11-30 ENCOUNTER — Ambulatory Visit (INDEPENDENT_AMBULATORY_CARE_PROVIDER_SITE_OTHER): Payer: Medicare Other | Admitting: Ophthalmology

## 2020-11-30 ENCOUNTER — Other Ambulatory Visit: Payer: Self-pay

## 2020-11-30 DIAGNOSIS — H35721 Serous detachment of retinal pigment epithelium, right eye: Secondary | ICD-10-CM | POA: Diagnosis not present

## 2020-11-30 DIAGNOSIS — H353122 Nonexudative age-related macular degeneration, left eye, intermediate dry stage: Secondary | ICD-10-CM

## 2020-11-30 DIAGNOSIS — H353114 Nonexudative age-related macular degeneration, right eye, advanced atrophic with subfoveal involvement: Secondary | ICD-10-CM | POA: Diagnosis not present

## 2020-11-30 DIAGNOSIS — H353211 Exudative age-related macular degeneration, right eye, with active choroidal neovascularization: Secondary | ICD-10-CM | POA: Diagnosis not present

## 2020-11-30 MED ORDER — BEVACIZUMAB 2.5 MG/0.1ML IZ SOSY
2.5000 mg | PREFILLED_SYRINGE | INTRAVITREAL | Status: AC | PRN
  Administered 2020-11-30: 2.5 mg via INTRAVITREAL

## 2020-11-30 NOTE — Assessment & Plan Note (Signed)

## 2020-11-30 NOTE — Assessment & Plan Note (Signed)

## 2020-11-30 NOTE — Progress Notes (Signed)
11/30/2020     CHIEF COMPLAINT Patient presents for Retina Follow Up (6month fu OU/ Possible Avastin OD//Pt states VA OU stable since last visit. Pt denies FOL, floaters, or ocular pain OU. Karie Mainland: 9.2/LBS: 098)   HISTORY OF PRESENT ILLNESS: Kristine Rocha is a 65 y.o. female who presents to the clinic today for:   HPI    Retina Follow Up    Diagnosis: Wet AMD   Laterality: right eye   Onset: 3 months ago   Duration: 3 months   Comments: 79month fu OU/ Possible Avastin OD  Pt states VA OU stable since last visit. Pt denies FOL, floaters, or ocular pain OU.  A1C: 9.2 LBS: 098       Last edited by Kendra Opitz, COA on 11/30/2020 10:23 AM. (History)      Referring physician: Glenda Chroman, MD [place],  [place] [address]  HISTORICAL INFORMATION:   Selected notes from the MEDICAL RECORD NUMBER       CURRENT MEDICATIONS: No current outpatient medications on file. (Ophthalmic Drugs)   No current facility-administered medications for this visit. (Ophthalmic Drugs)   Current Outpatient Medications (Other)  Medication Sig  . allopurinol (ZYLOPRIM) 300 MG tablet Take 300 mg by mouth every evening.   Marland Kitchen Apremilast (OTEZLA) 30 MG TABS Take 1 tablet by mouth daily.  Marland Kitchen atorvastatin (LIPITOR) 10 MG tablet Take 10 mg by mouth every evening.   . cetirizine (ZYRTEC) 10 MG tablet Take 10 mg by mouth daily.  . Cyanocobalamin (VITAMIN B-12 PO) Take by mouth daily.  . diphenhydrAMINE (BENADRYL) 25 MG tablet Take 25 mg by mouth at bedtime. As needed  . Ferrous Sulfate (IRON) 325 (65 Fe) MG TABS Take 1 tablet by mouth daily.  Marland Kitchen glipiZIDE (GLUCOTROL XL) 5 MG 24 hr tablet Take 5 mg by mouth daily.  . hyoscyamine (LEVSIN SL) 0.125 MG SL tablet Place under the tongue 3 (three) times daily as needed.  Marland Kitchen levothyroxine (SYNTHROID) 88 MCG tablet Take 88 mcg by mouth daily.  . Melatonin 10 MG TABS Take 10 mg by mouth at bedtime.   . metFORMIN (GLUCOPHAGE-XR) 500 MG 24 hr tablet Take 500 mg by  mouth 2 (two) times daily.  . metoprolol succinate (TOPROL-XL) 100 MG 24 hr tablet Take 100 mg by mouth daily. *Hold for low blood pressure*  . Multiple Vitamins-Minerals (PRESERVISION AREDS 2 PO) Take 1 tablet by mouth 2 (two) times daily.  Marland Kitchen omeprazole (PRILOSEC) 40 MG capsule Take 1 capsule (40 mg total) by mouth 2 (two) times daily before a meal.  . ONETOUCH VERIO test strip   . OVER THE COUNTER MEDICATION OTC Iron daily  . tiZANidine (ZANAFLEX) 4 MG tablet Take 4 mg by mouth 3 (three) times daily as needed for muscle spasms.   . Vitamin D, Ergocalciferol, (DRISDOL) 1.25 MG (50000 UNIT) CAPS capsule Take 50,000 Units by mouth every 7 (seven) days.  Marland Kitchen VITAMIN E PO Take 1 capsule by mouth every evening.   No current facility-administered medications for this visit. (Other)      REVIEW OF SYSTEMS:    ALLERGIES Allergies  Allergen Reactions  . Amoxicillin Hives, Shortness Of Breath and Rash    Did it involve swelling of the face/tongue/throat, SOB, or low BP? Yes Did it involve sudden or severe rash/hives, skin peeling, or any reaction on the inside of your mouth or nose? Unknown Did you need to seek medical attention at a hospital or doctor's office?  Yes When did it last happen?~15-20 years ago If all above answers are "NO", may proceed with cephalosporin use. .   . Ampicillin Hives, Shortness Of Breath and Rash    Did it involve swelling of the face/tongue/throat, SOB, or low BP? Yes Did it involve sudden or severe rash/hives, skin peeling, or any reaction on the inside of your mouth or nose? Unknown Did you need to seek medical attention at a hospital or doctor's office? Yes When did it last happen?~15-20 years ago If all above answers are "NO", may proceed with cephalosporin use. .  . Dust Mite Extract   . Molds & Smuts   . Other     NON-STEROIDAL ANTI-INFLAMMATORY DRUG CAUSED GASTRIC BLEED. Trees-especially pines.  Shelton Silvas [Lansoprazole] Diarrhea     PAST MEDICAL HISTORY Past Medical History:  Diagnosis Date  . Asthma   . Cirrhosis ([place]) 06/2019   Immune to Hep A. Has completed Hep B vaccine (May 2021)  . Diabetes ([place])   . Essential hypertension   . GERD (gastroesophageal reflux disease)   . History of GI bleed   . Hypothyroidism   . Macular degeneration    wet in right, dry in left, has injections for this.  . Microcytic anemia   . Palpitations   . Peptic ulcer disease   . Postural orthostatic tachycardia syndrome    Mildly orthostatic results tilt table test 2005  - Dr. Caryl Comes  . Psoriasis   . Rheumatoid arthritis [facility])    Past Surgical History:  Procedure Laterality Date  . ABDOMINAL HERNIA REPAIR  15 years ago   umbilical with mesh  . ABDOMINAL HYSTERECTOMY  20 years ago   . BIOPSY  11/04/2019   Procedure: BIOPSY;  Surgeon: Daneil Dolin, MD;  [facility];  Service: Endoscopy;;  duodenum gastric  . CATARACT EXTRACTION, BILATERAL Bilateral   . CHOLECYSTECTOMY    . COLONOSCOPY WITH ESOPHAGOGASTRODUODENOSCOPY (EGD)  03/2018   [facility]; indication:IDA; Normal exam.   . ESOPHAGOGASTRODUODENOSCOPY (EGD) WITH PROPOFOL N/A 11/04/2019   Procedure: ESOPHAGOGASTRODUODENOSCOPY (EGD) WITH PROPOFOL;  Surgeon: Daneil Dolin, MD; normal esophagus s/p empiric dilation, abnormal appearing gastric mucosa s/p biopsy, abnormal duodenal mucosa s/p biopsied, no ulcer, infiltrating process, or portal gastropathy.  Duodenal biopsies benign.  Gastric biopsy with reactive gastropathy with erosions, no H. pylori.   Marland Kitchen MALONEY DILATION N/A 11/04/2019   Procedure: Venia Minks DILATION;  Surgeon: Daneil Dolin, MD;  Location: [facility];  Service: Endoscopy;  Laterality: N/A;  . TILT TABLE STUDY  2005   [facility]  . TRIGGER FINGER RELEASE Right    2nd finger    FAMILY HISTORY Family History  Problem Relation Age of Onset  . Heart attack Father        Age 40  . Arrhythmia Sister        WPW s/p ablation- corrected at  [facility]  . Alzheimer's disease Mother   . Pneumonia Mother        Aspiration  . Heart attack Brother   . Heart attack Maternal Grandfather   . Colon cancer Maternal Aunt        diagnosed at 37  . Breast cancer Maternal Grandmother     SOCIAL HISTORY Social History   Tobacco Use  . Smoking status: Never Smoker  . Smokeless tobacco: Never Used  Vaping Use  . Vaping Use: Never used  Substance Use Topics  . Alcohol use: No    Alcohol/week: 0.0 standard drinks  .  Drug use: No         OPHTHALMIC EXAM:  Base Eye Exam    Visual Acuity (ETDRS)      Right Left   Dist [place] 20/80 -2 20/20   Dist ph [place] 20/70        Tonometry (Tonopen, 10:27 AM)      Right Left   Pressure 12 14       Pupils      Dark Light Shape React APD   Right 3 2 Round Sluggish None   Left 3 3 Round Sluggish None       Visual Fields (Counting fingers)      Left Right    Full Full       Extraocular Movement      Right Left    Full Full       Neuro/Psych    Oriented x3: Yes   Mood/Affect: Normal       Dilation    Both eyes: 1.0% Mydriacyl, 2.5% Phenylephrine @ 10:27 AM        Slit Lamp and Fundus Exam    External Exam      Right Left   External Normal Normal       Slit Lamp Exam      Right Left   Lids/Lashes Normal Normal   Conjunctiva/Sclera White and quiet White and quiet   Cornea Clear Clear   Anterior Chamber Deep and quiet Deep and quiet   Iris Round and reactive, TI defect 530-630 Round and reactive   Lens Posterior chamber intraocular lens Posterior chamber intraocular lens   Anterior Vitreous Normal Normal       Fundus Exam      Right Left   Posterior Vitreous Posterior vitreous detachment Posterior vitreous detachment   Disc Normal Normal   C/D Ratio 0.5 0.5   Macula Retinal pigment epithelial atrophy in the FAZ, Advanced age related macular degeneration, Hard drusen, no exudates, no hemorrhage, Mottling, Retinal pigment epithelial mottling, Geographic atrophy, Soft  drusen, no macular thickening Hard drusen, no hemorrhage, no macular thickening, no exudates, Epiretinal membrane   Vessels Normal Normal   Periphery Normal Normal          IMAGING AND PROCEDURES  Imaging and Procedures for 11/30/20  OCT, Retina - OU - Both Eyes       Right Eye Quality was good. Scan locations included subfoveal. Central Foveal Thickness: 189. Progression has been stable. Findings include abnormal foveal contour, subretinal fluid, outer retinal atrophy, inner retinal atrophy, central retinal atrophy.   Left Eye Quality was good. Scan locations included subfoveal. Central Foveal Thickness: 270. Findings include epiretinal membrane, no SRF, no IRF.   Notes Central foveal atrophy with chronic residual subretinal fluid Yet stable overall at 70-month interval post Avastin.  No risk of scotoma enlargement.  OD.  OS with minor epiretinal membrane nasal to the fovea not center involved not impactful to vision         Intravitreal Injection, Pharmacologic Agent - OD - Right Eye       Time Out 11/30/2020. 11:31 AM. Confirmed correct patient, procedure, site, and patient consented.   Anesthesia Topical anesthesia was used. Anesthetic medications included [place] 3.5%.   Procedure Preparation included 10% betadine to eyelids, 5% betadine to ocular surface, Ofloxacin . A 30 gauge needle was used.   Injection:  2.5 mg Bevacizumab (AVASTIN) 2.5mg /0.103mL SOSY   NDC: 26378-588-50, Lot: [id number]   Route: Intravitreal, Site: Right Eye  Post-op Post injection  exam found visual acuity of at least counting fingers. The patient tolerated the procedure well. There were no complications. The patient received written and verbal post procedure care education. Post injection medications were not given.                 ASSESSMENT/PLAN:  Serous detachment of retinal pigment epithelium of right eye The nature of wet macular degeneration was discussed with the patient.  Forms  of therapy reviewed include the use of Anti-VEGF medications injected painlessly into the eye, as well as other possible treatment modalities, including thermal laser therapy. Fellow eye involvement and risks were discussed with the patient. Upon the finding of wet age related macular degeneration, treatment will be offered. The treatment regimen is on a treat as needed basis with the intent to treat if necessary and extend interval of exams when possible. On average 1 out of 6 patients do not need lifetime therapy. However, the risk of recurrent disease is high for a lifetime.  Initially monthly, then periodic, examinations and evaluations will determine whether the next treatment is required on the day of the examination.  Intermediate stage nonexudative age-related macular degeneration of left eye The nature of age--related macular degeneration was discussed with the patient as well as the distinction between dry and wet types. Checking an Amsler Grid daily with advice to return immediately should a distortion develop, was given to the patient. The patient 's smoking status now and in the past was determined and advice based on the AREDS study was provided regarding the consumption of antioxidant supplements. AREDS 2 vitamin formulation was recommended. Consumption of dark leafy vegetables and fresh fruits of various colors was recommended. Treatment modalities for wet macular degeneration particularly the use of intravitreal injections of anti-blood vessel growth factors was discussed with the patient. Avastin, Lucentis, and Eylea are the available options. On occasion, therapy includes the use of photodynamic therapy and thermal laser. Stressed to the patient do not rub eyes.  Patient was advised to check Amsler Grid daily and return immediately if changes are noted. Instructions on using the grid were given to the patient. All patient questions were answered.      ICD-10-CM   1. Exudative age-related  macular degeneration of right eye with active choroidal neovascularization (HCC)  H35.3211 OCT, Retina - OU - Both Eyes    Intravitreal Injection, Pharmacologic Agent - OD - Right Eye    bevacizumab (AVASTIN) SOSY 2.5 mg  2. Advanced nonexudative age-related macular degeneration of right eye with subfoveal involvement  H35.3114 Intravitreal Injection, Pharmacologic Agent - OD - Right Eye    bevacizumab (AVASTIN) SOSY 2.5 mg  3. Serous detachment of retinal pigment epithelium of right eye  H35.721   4. Intermediate stage nonexudative age-related macular degeneration of left eye  H35.3122     1.  Repeat intravitreal Avastin today to control serous retinal detachment component of CNVM OD.  Stable at 3 months.  Stable acuity.  We will follow-up again dilate OD in 3 months 2.  3.  Ophthalmic Meds Ordered this visit:  Meds ordered this encounter  Medications  . bevacizumab (AVASTIN) SOSY 2.5 mg       Return in about 3 months (around 03/02/2021) for dilate, OD, AVASTIN OCT.  There are no Patient Instructions on file for this visit.   Explained the diagnoses, plan, and follow up with the patient and they expressed understanding.  Patient expressed understanding of the importance of proper follow up care.   Dominica Severin  Dorina Hoyer M.D. Diseases & Surgery of the Retina and Vitreous [facility] [place] 11/30/20     Abbreviations: M myopia (nearsighted); A astigmatism; H hyperopia (farsighted); P presbyopia; Mrx spectacle prescription;  CTL contact lenses; OD right eye; OS left eye; OU both eyes  XT exotropia; ET esotropia; PEK punctate epithelial keratitis; PEE punctate epithelial erosions; DES dry eye syndrome; MGD meibomian gland dysfunction; ATs artificial tears; PFAT's preservative free artificial tears; [place] nuclear sclerotic cataract; PSC posterior subcapsular cataract; ERM epi-retinal membrane; PVD posterior vitreous detachment; RD retinal detachment; DM diabetes mellitus; DR diabetic  retinopathy; NPDR non-proliferative diabetic retinopathy; PDR proliferative diabetic retinopathy; CSME clinically significant macular edema; DME diabetic macular edema; dbh dot blot hemorrhages; CWS cotton wool spot; POAG primary open angle glaucoma; C/D cup-to-disc ratio; HVF humphrey visual field; GVF goldmann visual field; OCT optical coherence tomography; IOP intraocular pressure; BRVO Branch retinal vein occlusion; CRVO central retinal vein occlusion; CRAO central retinal artery occlusion; BRAO branch retinal artery occlusion; RT retinal tear; SB scleral buckle; PPV pars plana vitrectomy; VH Vitreous hemorrhage; PRP panretinal laser photocoagulation; IVK intravitreal kenalog; VMT vitreomacular traction; MH Macular hole;  NVD neovascularization of the disc; NVE neovascularization elsewhere; AREDS age related eye disease study; ARMD age related macular degeneration; POAG primary open angle glaucoma; EBMD epithelial/anterior basement membrane dystrophy; ACIOL anterior chamber intraocular lens; IOL intraocular lens; PCIOL posterior chamber intraocular lens; Phaco/IOL phacoemulsification with intraocular lens placement; [place] photorefractive keratectomy; LASIK laser assisted in situ keratomileusis; HTN hypertension; DM diabetes mellitus; COPD chronic obstructive pulmonary disease

## 2020-12-22 DIAGNOSIS — E1165 Type 2 diabetes mellitus with hyperglycemia: Secondary | ICD-10-CM | POA: Diagnosis not present

## 2020-12-24 ENCOUNTER — Other Ambulatory Visit: Payer: Self-pay | Admitting: Gastroenterology

## 2020-12-24 DIAGNOSIS — Z299 Encounter for prophylactic measures, unspecified: Secondary | ICD-10-CM | POA: Diagnosis not present

## 2020-12-24 DIAGNOSIS — K219 Gastro-esophageal reflux disease without esophagitis: Secondary | ICD-10-CM

## 2020-12-24 DIAGNOSIS — R809 Proteinuria, unspecified: Secondary | ICD-10-CM | POA: Diagnosis not present

## 2020-12-24 DIAGNOSIS — J069 Acute upper respiratory infection, unspecified: Secondary | ICD-10-CM | POA: Diagnosis not present

## 2020-12-24 DIAGNOSIS — K7460 Unspecified cirrhosis of liver: Secondary | ICD-10-CM | POA: Diagnosis not present

## 2020-12-24 DIAGNOSIS — E1129 Type 2 diabetes mellitus with other diabetic kidney complication: Secondary | ICD-10-CM | POA: Diagnosis not present

## 2020-12-28 DIAGNOSIS — D509 Iron deficiency anemia, unspecified: Secondary | ICD-10-CM | POA: Diagnosis not present

## 2020-12-28 DIAGNOSIS — K7460 Unspecified cirrhosis of liver: Secondary | ICD-10-CM | POA: Diagnosis not present

## 2020-12-29 LAB — CBC WITH DIFFERENTIAL/PLATELET
Basophils Absolute: 0 10*3/uL (ref 0.0–0.2)
Basos: 0 %
EOS (ABSOLUTE): 0 10*3/uL (ref 0.0–0.4)
Eos: 0 %
Hematocrit: 37.2 % (ref 34.0–46.6)
Hemoglobin: 12.2 g/dL (ref 11.1–15.9)
Immature Grans (Abs): 0 10*3/uL (ref 0.0–0.1)
Immature Granulocytes: 0 %
Lymphocytes Absolute: 2 10*3/uL (ref 0.7–3.1)
Lymphs: 22 %
MCH: 30 pg (ref 26.6–33.0)
MCHC: 32.8 g/dL (ref 31.5–35.7)
MCV: 91 fL (ref 79–97)
Monocytes Absolute: 0.5 10*3/uL (ref 0.1–0.9)
Monocytes: 5 %
Neutrophils Absolute: 6.9 10*3/uL (ref 1.4–7.0)
Neutrophils: 73 %
Platelets: 196 10*3/uL (ref 150–450)
RBC: 4.07 x10E6/uL (ref 3.77–5.28)
RDW: 16.1 % — ABNORMAL HIGH (ref 11.7–15.4)
WBC: 9.4 10*3/uL (ref 3.4–10.8)

## 2020-12-29 LAB — CMP14+EGFR
ALT: 37 IU/L — ABNORMAL HIGH (ref 0–32)
AST: 30 IU/L (ref 0–40)
Albumin/Globulin Ratio: 1.4 (ref 1.2–2.2)
Albumin: 4 g/dL (ref 3.8–4.8)
Alkaline Phosphatase: 103 IU/L (ref 44–121)
BUN/Creatinine Ratio: 16 (ref 12–28)
BUN: 16 mg/dL (ref 8–27)
Bilirubin Total: 0.3 mg/dL (ref 0.0–1.2)
CO2: 23 mmol/L (ref 20–29)
Calcium: 9.5 mg/dL (ref 8.7–10.3)
Chloride: 101 mmol/L (ref 96–106)
Creatinine, Ser: 0.97 mg/dL (ref 0.57–1.00)
Globulin, Total: 2.8 g/dL (ref 1.5–4.5)
Glucose: 90 mg/dL (ref 65–99)
Potassium: 4.8 mmol/L (ref 3.5–5.2)
Sodium: 139 mmol/L (ref 134–144)
Total Protein: 6.8 g/dL (ref 6.0–8.5)
eGFR: 64 mL/min/{1.73_m2} (ref >59)

## 2020-12-29 LAB — AFP TUMOR MARKER: AFP, Serum, Tumor Marker: 11.8 ng/mL — ABNORMAL HIGH (ref 0.0–9.2)

## 2020-12-29 LAB — VITAMIN B12: Vitamin B-12: 402 pg/mL (ref 232–1245)

## 2020-12-29 LAB — PROTIME-INR
INR: 1.1 (ref 0.9–1.2)
Prothrombin Time: 11.2 s (ref 9.1–12.0)

## 2020-12-29 LAB — FOLATE: Folate: >20 ng/mL (ref >3.0)

## 2020-12-29 LAB — IRON,TIBC AND FERRITIN PANEL
Ferritin: 22 ng/mL (ref 15–150)
Iron Saturation: 11 % — ABNORMAL LOW (ref 15–55)
Iron: 44 ug/dL (ref 27–139)
Total Iron Binding Capacity: 399 ug/dL (ref 250–450)
UIBC: 355 ug/dL (ref 118–369)

## 2020-12-31 ENCOUNTER — Telehealth: Payer: Self-pay

## 2020-12-31 NOTE — Progress Notes (Signed)
Phoned and LMOVM of the pt to return call. 

## 2020-12-31 NOTE — Telephone Encounter (Signed)
Pt wants you to call her please.

## 2020-12-31 NOTE — Telephone Encounter (Signed)
Tried to call pt back.  Vm full.

## 2021-01-01 ENCOUNTER — Other Ambulatory Visit: Payer: Self-pay | Admitting: *Deleted

## 2021-01-01 DIAGNOSIS — Z8719 Personal history of other diseases of the digestive system: Secondary | ICD-10-CM

## 2021-01-01 DIAGNOSIS — R772 Abnormality of alphafetoprotein: Secondary | ICD-10-CM

## 2021-01-01 DIAGNOSIS — R748 Abnormal levels of other serum enzymes: Secondary | ICD-10-CM

## 2021-01-01 NOTE — Telephone Encounter (Signed)
Called pt and made her aware of results.

## 2021-01-14 DIAGNOSIS — L409 Psoriasis, unspecified: Secondary | ICD-10-CM | POA: Diagnosis not present

## 2021-01-14 DIAGNOSIS — M1009 Idiopathic gout, multiple sites: Secondary | ICD-10-CM | POA: Diagnosis not present

## 2021-01-14 DIAGNOSIS — M150 Primary generalized (osteo)arthritis: Secondary | ICD-10-CM | POA: Diagnosis not present

## 2021-01-14 DIAGNOSIS — R768 Other specified abnormal immunological findings in serum: Secondary | ICD-10-CM | POA: Diagnosis not present

## 2021-01-21 DIAGNOSIS — E1165 Type 2 diabetes mellitus with hyperglycemia: Secondary | ICD-10-CM | POA: Diagnosis not present

## 2021-02-01 ENCOUNTER — Encounter: Payer: Self-pay | Admitting: Infectious Disease

## 2021-02-01 ENCOUNTER — Ambulatory Visit: Payer: Medicare Other | Admitting: Infectious Disease

## 2021-02-01 ENCOUNTER — Other Ambulatory Visit: Payer: Self-pay

## 2021-02-01 VITALS — Temp 98.3°F | Wt 207.0 lb

## 2021-02-01 DIAGNOSIS — Z227 Latent tuberculosis: Secondary | ICD-10-CM

## 2021-02-01 DIAGNOSIS — K7460 Unspecified cirrhosis of liver: Secondary | ICD-10-CM | POA: Diagnosis not present

## 2021-02-01 DIAGNOSIS — D509 Iron deficiency anemia, unspecified: Secondary | ICD-10-CM

## 2021-02-01 DIAGNOSIS — L409 Psoriasis, unspecified: Secondary | ICD-10-CM

## 2021-02-01 HISTORY — DX: Latent tuberculosis: Z22.7

## 2021-02-01 MED ORDER — VITAMIN B-6 50 MG PO TABS: 50.0000 mg | ORAL_TABLET | Freq: Every day | ORAL | 8 refills | Status: DC

## 2021-02-01 MED ORDER — ISONIAZID 300 MG PO TABS: 300.0000 mg | ORAL_TABLET | Freq: Every day | ORAL | 8 refills | Status: DC

## 2021-02-01 NOTE — Progress Notes (Signed)
Subjective:   Reason for Infectious Disease Consult: Latent tuberculosis Requesting physician: Gavin Pound, MD  [place] care physician: Jerene Bears, MD   Patient ID: Kristine Rocha, female    DOB: 09-12-1955, 65 y.o.   MRN: [id number]  HPI  Kristine Rocha is a very pleasant 65 year old Caucasian lady with a history of psoriasis and psoriatic arthritis who developed hepatotoxicity in the past on methotrexate with cirrhosis of the liver ensuing.  She has history of an uncle on her father side having pulmonary tuberculosis in the 69s.  She was exposed to him at that time and later in the 1980s had a positive skin test x2 to tuberculosis.  She was never treated for latent tuberculosis.  Tablets with Dr. Lenna Gilford for her psoriasis and psoriatic arthritis.   Patient is currently not on any active drugs for psoriasis.  Ideally her rheumatologist would like to be able to use more potent immunosuppressive therapy including biologic therapy possibly.  However certainly I agree that her latent TB needs to be treated first.  She has had a chest x-ray that is without evidence of cardiopulmonary disease and she does not have any symptoms to suggest active tuberculosis.  We considered different options and she would prefer to go with isoniazid to minimize risk of hepatotoxicity given her pre-existing cirrhosis of the liver that developed while she was getting methotrexate therapy.    Past Medical History:  Diagnosis Date   Asthma    Cirrhosis ([place]) 06/2019   Immune to Hep A. Has completed Hep B vaccine (May 2021)   Diabetes [facility])    Essential hypertension    GERD (gastroesophageal reflux disease)    History of GI bleed    Hypothyroidism    Macular degeneration    wet in right, dry in left, has injections for this.   Microcytic anemia    Palpitations    Peptic ulcer disease    Postural orthostatic tachycardia syndrome    Mildly orthostatic results tilt table test 2005  - Dr. 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    ESOPHAGOGASTRODUODENOSCOPY (EGD) WITH PROPOFOL N/A 11/04/2019   Procedure: ESOPHAGOGASTRODUODENOSCOPY (EGD) WITH PROPOFOL;  Surgeon: Daneil Dolin, MD; normal esophagus s/p empiric dilation, abnormal appearing gastric mucosa s/p biopsy, abnormal duodenal mucosa s/p biopsied, no ulcer, infiltrating process, or portal gastropathy.  Duodenal biopsies benign.  Gastric biopsy with reactive gastropathy with erosions, no H. pylori.    MALONEY DILATION N/A 11/04/2019   Procedure: Venia Minks DILATION;  Surgeon: Daneil Dolin, MD;  Location: [facility];  Service: Endoscopy;  Laterality: N/A;   TILT TABLE STUDY  2005   [facility]   TRIGGER FINGER RELEASE Right    2nd finger    Family History  Problem Relation Age of Onset   Heart attack Father        Age 51   Arrhythmia Sister        WPW s/p ablation- corrected  at [facility]   Alzheimer's disease Mother    Pneumonia Mother        Aspiration   Heart attack Brother    Heart attack Maternal Grandfather    Colon cancer Maternal Aunt        diagnosed at 70   Breast cancer Maternal Grandmother       Social History   Socioeconomic History   Marital status: Married    Spouse name: Not on file   Number of children: Not on file   Years of education: Not on file   Highest education level: Not on file  Occupational History   Not on file  Tobacco Use   Smoking status: Never   Smokeless tobacco: Never  Vaping Use   Vaping Use:  Never used  Substance and Sexual Activity   Alcohol use: No    Alcohol/week: 0.0 standard drinks   Drug use: No   Sexual activity: Yes  Other Topics Concern   Not on file  Social History Narrative   Not on file   Social Determinants of Health   Financial Resource Strain: Not on file  Food Insecurity: Not on file  Transportation Needs: Not on file  Physical Activity: Not on file  Stress: Not on file  Social Connections: Not on file    Allergies  Allergen Reactions   Amoxicillin Hives, Shortness Of Breath and Rash    Did it involve swelling of the face/tongue/throat, SOB, or low BP? Yes Did it involve sudden or severe rash/hives, skin peeling, or any reaction on the inside of your mouth or nose? Unknown Did you need to seek medical attention at a hospital or doctor's office? Yes When did it last happen?~15-20 years ago       If all above answers are "NO", may proceed with cephalosporin use. .    Ampicillin Hives, Shortness Of Breath and Rash    Did it involve swelling of the face/tongue/throat, SOB, or low BP? Yes Did it involve sudden or severe rash/hives, skin peeling, or any reaction on the inside of your mouth or nose? Unknown Did you need to seek medical attention at a hospital or doctor's office? Yes When did it last happen?~15-20 years ago       If all above answers are "NO", may proceed with cephalosporin use. .   Dust Mite Extract    Molds & Smuts    Other     NON-STEROIDAL ANTI-INFLAMMATORY DRUG CAUSED GASTRIC BLEED. Trees-especially pines.   Prevacid [Lansoprazole] Diarrhea     Current Outpatient Medications:    allopurinol (ZYLOPRIM) 300 MG tablet, Take 300 mg by mouth every evening. , Disp: , Rfl:    Apremilast (OTEZLA) 30 MG TABS, Take 1 tablet by mouth daily., Disp: , Rfl:    atorvastatin (LIPITOR) 10 MG tablet, Take 10 mg by mouth every evening. , Disp: , Rfl:    cetirizine (ZYRTEC) 10 MG tablet, Take 10 mg by mouth daily., Disp: , Rfl:     Cyanocobalamin (VITAMIN B-12 PO), Take by mouth daily., Disp: , Rfl:    diphenhydrAMINE (BENADRYL) 25 MG tablet, Take 25 mg by mouth at bedtime. As needed, Disp: , Rfl:    Ferrous Sulfate (IRON) 325 (65 Fe) MG TABS, Take 1 tablet by mouth daily., Disp: , Rfl:    glipiZIDE (GLUCOTROL XL) 5 MG 24 hr tablet, Take 5 mg by mouth daily., Disp: , Rfl:    hyoscyamine (LEVSIN SL) 0.125 MG SL tablet, Place under the tongue 3 (  three) times daily as needed., Disp: , Rfl:    levothyroxine (SYNTHROID) 88 MCG tablet, Take 88 mcg by mouth daily., Disp: , Rfl:    Melatonin 10 MG TABS, Take 10 mg by mouth at bedtime. , Disp: , Rfl:    metFORMIN (GLUCOPHAGE-XR) 500 MG 24 hr tablet, Take 500 mg by mouth 2 (two) times daily., Disp: , Rfl: 0   metoprolol succinate (TOPROL-XL) 100 MG 24 hr tablet, Take 100 mg by mouth daily. *Hold for low blood pressure*, Disp: , Rfl:    Multiple Vitamins-Minerals (PRESERVISION AREDS 2 PO), Take 1 tablet by mouth 2 (two) times daily., Disp: , Rfl:    omeprazole (PRILOSEC) 40 MG capsule, TAKE 1 CAPSULE BY MOUTH  TWICE DAILY BEFORE A MEAL, Disp: 180 capsule, Rfl: 2   ONETOUCH VERIO test strip, , Disp: , Rfl: 0   OVER THE COUNTER MEDICATION, OTC Iron daily, Disp: , Rfl:    tiZANidine (ZANAFLEX) 4 MG tablet, Take 4 mg by mouth 3 (three) times daily as needed for muscle spasms. , Disp: , Rfl:    Vitamin D, Ergocalciferol, (DRISDOL) 1.25 MG (50000 UNIT) CAPS capsule, Take 50,000 Units by mouth every 7 (seven) days., Disp: , Rfl:    VITAMIN E PO, Take 1 capsule by mouth every evening., Disp: , Rfl:    Review of Systems  Constitutional:  Negative for chills, diaphoresis and fever.  HENT:  Negative for congestion, hearing loss, sore throat and tinnitus.   Eyes:  Negative for photophobia.  Respiratory:  Negative for cough, shortness of breath and wheezing.   Cardiovascular:  Negative for chest pain, palpitations and leg swelling.  Gastrointestinal:  Negative for abdominal pain, blood in  stool, constipation, diarrhea, nausea and vomiting.  Genitourinary:  Negative for dysuria, flank pain and hematuria.  Musculoskeletal:  Negative for back pain and myalgias.  Skin:  Negative for rash.  Neurological:  Negative for dizziness, weakness and headaches.  Hematological:  Does not bruise/bleed easily.  Psychiatric/Behavioral:  Negative for agitation, behavioral problems, confusion, decreased concentration, dysphoric mood and suicidal ideas. The patient is not nervous/anxious and is not hyperactive.       Objective:   Physical Exam Constitutional:      General: She is not in acute distress.    Appearance: Normal appearance. She is well-developed. She is not ill-appearing or diaphoretic.  HENT:     Head: Normocephalic and atraumatic.     Right Ear: Hearing and external ear normal.     Left Ear: Hearing and external ear normal.     Nose: No nasal deformity or rhinorrhea.  Eyes:     General: No scleral icterus.    Extraocular Movements: Extraocular movements intact.     Conjunctiva/sclera: Conjunctivae normal.     Right eye: Right conjunctiva is not injected.     Left eye: Left conjunctiva is not injected.     Pupils: Pupils are equal, round, and reactive to light.  Neck:     Vascular: No JVD.  Cardiovascular:     Rate and Rhythm: Normal rate and regular rhythm.     Heart sounds: Normal heart sounds, S1 normal and S2 normal. No murmur heard.   No friction rub.  Pulmonary:     Effort: Pulmonary effort is normal. No respiratory distress.     Breath sounds: Normal breath sounds. No stridor. No wheezing.  Abdominal:     General: Bowel sounds are normal. There is no distension.     Palpations: Abdomen is soft.  Tenderness: There is no abdominal tenderness. There is no guarding.  Musculoskeletal:        General: Normal range of motion.     Right shoulder: Normal.     Left shoulder: Normal.     Cervical back: Normal range of motion and neck supple.     Right hip: Normal.      Left hip: Normal.     Right knee: Normal.     Left knee: Normal.  Lymphadenopathy:     Head:     Right side of head: No submandibular, preauricular or posterior auricular adenopathy.     Left side of head: No submandibular, preauricular or posterior auricular adenopathy.     Cervical: No cervical adenopathy.     Right cervical: No superficial or deep cervical adenopathy.    Left cervical: No superficial or deep cervical adenopathy.  Skin:    General: Skin is warm and dry.     Coloration: Skin is not pale.     Findings: No abrasion, bruising, ecchymosis, erythema, lesion or rash.     Nails: There is no clubbing.  Neurological:     General: No focal deficit present.     Mental Status: She is alert and oriented to person, place, and time.     Sensory: No sensory deficit.     Coordination: Coordination normal.     Gait: Gait normal.  Psychiatric:        Attention and Perception: She is attentive.        Mood and Affect: Mood normal.        Speech: Speech normal.        Behavior: Behavior normal. Behavior is cooperative.        Thought Content: Thought content normal.        Judgment: Judgment normal.          Assessment & Plan:  Latent TB: We will start isoniazid 300 mg daily with.  Oxen 15 mg.  We will check baseline labs today also including HIV hepatitis B and hepatitis C and a serologies.  And her back in 1 month's time to see how she is tolerating these medications and recheck CMP and CBC.  Psoriatic arthritis: Currently taking symptomatic treatment with Zanaflex.  Restless of the liver: This was from methotrexate she says.  She is very much 1 to be cautious with hepatotoxic drugs and we will respect that.   I spent more than 60 minutes with the patient including  face to face counseling of the patient personally reviewing radiographs, along with pertinent laboratory microbiological, virological data, review of medical records in preparation for the visit and  during the visit and in coordination of her care.

## 2021-02-02 DIAGNOSIS — L4050 Arthropathic psoriasis, unspecified: Secondary | ICD-10-CM | POA: Diagnosis not present

## 2021-02-02 DIAGNOSIS — I10 Essential (primary) hypertension: Secondary | ICD-10-CM | POA: Diagnosis not present

## 2021-02-02 DIAGNOSIS — Z299 Encounter for prophylactic measures, unspecified: Secondary | ICD-10-CM | POA: Diagnosis not present

## 2021-02-02 DIAGNOSIS — E1165 Type 2 diabetes mellitus with hyperglycemia: Secondary | ICD-10-CM | POA: Diagnosis not present

## 2021-02-02 LAB — CBC WITH DIFFERENTIAL/PLATELET
Absolute Monocytes: 330 cells/uL (ref 200–950)
Basophils Absolute: 12 cells/uL (ref 0–200)
Basophils Relative: 0.2 %
Eosinophils Absolute: 0 cells/uL — ABNORMAL LOW (ref 15–500)
Eosinophils Relative: 0 %
HCT: 36.5 % (ref 35.0–45.0)
Hemoglobin: 11.5 g/dL — ABNORMAL LOW (ref 11.7–15.5)
Lymphs Abs: 1280 cells/uL (ref 850–3900)
MCH: 28.8 pg (ref 27.0–33.0)
MCHC: 31.5 g/dL — ABNORMAL LOW (ref 32.0–36.0)
MCV: 91.3 fL (ref 80.0–100.0)
MPV: 11.3 fL (ref 7.5–12.5)
Monocytes Relative: 5.6 %
Neutro Abs: 4278 cells/uL (ref 1500–7800)
Neutrophils Relative %: 72.5 %
Platelets: 146 10*3/uL (ref 140–400)
RBC: 4 10*6/uL (ref 3.80–5.10)
RDW: 13.8 % (ref 11.0–15.0)
Total Lymphocyte: 21.7 %
WBC: 5.9 10*3/uL (ref 3.8–10.8)

## 2021-02-02 LAB — COMPLETE METABOLIC PANEL WITH GFR
AG Ratio: 1.6 (calc) (ref 1.0–2.5)
ALT: 32 U/L — ABNORMAL HIGH (ref 6–29)
AST: 33 U/L (ref 10–35)
Albumin: 3.9 g/dL (ref 3.6–5.1)
Alkaline phosphatase (APISO): 96 U/L (ref 37–153)
BUN: 10 mg/dL (ref 7–25)
CO2: 24 mmol/L (ref 20–32)
Calcium: 9.1 mg/dL (ref 8.6–10.4)
Chloride: 102 mmol/L (ref 98–110)
Creat: 0.78 mg/dL (ref 0.50–1.05)
Globulin: 2.5 g/dL (calc) (ref 1.9–3.7)
Glucose, Bld: 177 mg/dL — ABNORMAL HIGH (ref 65–99)
Potassium: 4.6 mmol/L (ref 3.5–5.3)
Sodium: 136 mmol/L (ref 135–146)
Total Bilirubin: 0.3 mg/dL (ref 0.2–1.2)
Total Protein: 6.4 g/dL (ref 6.1–8.1)
eGFR: 84 mL/min/{1.73_m2} (ref >=60)

## 2021-02-02 LAB — HEPATITIS C ANTIBODY
Hepatitis C Ab: NONREACTIVE
SIGNAL TO CUT-OFF: 0.02 (ref <1.00)

## 2021-02-02 LAB — HIV ANTIBODY (ROUTINE TESTING W REFLEX): HIV 1&2 Ab, 4th Generation: NONREACTIVE

## 2021-02-02 LAB — HEPATITIS B SURFACE ANTIBODY, QUANTITATIVE: Hep B S AB Quant (Post): <5 m[IU]/mL — ABNORMAL LOW (ref >=10)

## 2021-02-02 LAB — HEPATITIS A ANTIBODY, TOTAL: Hepatitis A AB,Total: REACTIVE — AB

## 2021-02-02 LAB — HEPATITIS B SURFACE ANTIGEN: Hepatitis B Surface Ag: NONREACTIVE

## 2021-02-19 DIAGNOSIS — E1165 Type 2 diabetes mellitus with hyperglycemia: Secondary | ICD-10-CM | POA: Diagnosis not present

## 2021-02-21 DIAGNOSIS — E1165 Type 2 diabetes mellitus with hyperglycemia: Secondary | ICD-10-CM | POA: Diagnosis not present

## 2021-03-01 ENCOUNTER — Other Ambulatory Visit: Payer: Self-pay

## 2021-03-01 ENCOUNTER — Encounter (INDEPENDENT_AMBULATORY_CARE_PROVIDER_SITE_OTHER): Payer: Self-pay | Admitting: Ophthalmology

## 2021-03-01 ENCOUNTER — Ambulatory Visit (INDEPENDENT_AMBULATORY_CARE_PROVIDER_SITE_OTHER): Payer: Medicare Other | Admitting: Ophthalmology

## 2021-03-01 DIAGNOSIS — H353122 Nonexudative age-related macular degeneration, left eye, intermediate dry stage: Secondary | ICD-10-CM | POA: Diagnosis not present

## 2021-03-01 DIAGNOSIS — H353211 Exudative age-related macular degeneration, right eye, with active choroidal neovascularization: Secondary | ICD-10-CM

## 2021-03-01 DIAGNOSIS — H35372 Puckering of macula, left eye: Secondary | ICD-10-CM | POA: Diagnosis not present

## 2021-03-01 MED ORDER — BEVACIZUMAB 2.5 MG/0.1ML IZ SOSY
2.5000 mg | PREFILLED_SYRINGE | INTRAVITREAL | Status: AC | PRN
  Administered 2021-03-01: 2.5 mg via INTRAVITREAL

## 2021-03-01 NOTE — Progress Notes (Signed)
03/01/2021     CHIEF COMPLAINT Patient presents for Retina Follow Up (13 week fu OD and Avastin OD/Pt states VA OU stable since last visit. Pt denies FOL, floaters, or ocular pain OU. /Pt states, "I have been seeing things that look like floaters in my OS but they are clear and they look like bubbles."/A1C: 6.6/LBS: 121/Pt states, "I am being treated for TB. I do not have active TB but I am about to start a new medicine for my Psoriasis and I have to treat the latent TB."/ )   HISTORY OF PRESENT ILLNESS: Kristine Rocha is a 65 y.o. female who presents to the clinic today for:   HPI     Retina Follow Up           Diagnosis: Wet AMD   Laterality: right eye   Onset: 13 weeks ago   Severity: mild   Duration: 13 weeks   Course: stable   Comments: 13 week fu OD and Avastin OD Pt states VA OU stable since last visit. Pt denies FOL, floaters, or ocular pain OU.  Pt states, "I have been seeing things that look like floaters in my OS but they are clear and they look like bubbles." A1C: 6.6 LBS: 121 Pt states, "I am being treated for TB. I do not have active TB but I am about to start a new medicine for my Psoriasis and I have to treat the latent TB."         Last edited by Kendra Opitz, COA on 03/01/2021 10:28 AM.      Referring physician: Glenda Chroman, MD [place],  [place] [address]  HISTORICAL INFORMATION:   Selected notes from the [facility]: No current outpatient medications on file. (Ophthalmic Drugs)   No current facility-administered medications for this visit. (Ophthalmic Drugs)   Current Outpatient Medications (Other)  Medication Sig   allopurinol (ZYLOPRIM) 300 MG tablet Take 300 mg by mouth every evening.  (Patient not taking: Reported on 02/01/2021)   Apremilast (OTEZLA) 30 MG TABS Take 1 tablet by mouth daily. (Patient not taking: Reported on 02/01/2021)   atorvastatin (LIPITOR) 10 MG tablet Take 10 mg by mouth every  evening.    cetirizine (ZYRTEC) 10 MG tablet Take 10 mg by mouth daily.   Cyanocobalamin (VITAMIN B-12 PO) Take by mouth daily.   diphenhydrAMINE (BENADRYL) 25 MG tablet Take 25 mg by mouth at bedtime. As needed   Ferrous Sulfate (IRON) 325 (65 Fe) MG TABS Take 1 tablet by mouth daily.   glipiZIDE (GLUCOTROL XL) 5 MG 24 hr tablet Take 5 mg by mouth daily.   hyoscyamine (LEVSIN SL) 0.125 MG SL tablet Place under the tongue 3 (three) times daily as needed. (Patient not taking: Reported on 02/01/2021)   isoniazid (NYDRAZID) 300 MG tablet Take 1 tablet (300 mg total) by mouth daily.   levothyroxine (SYNTHROID) 88 MCG tablet Take 88 mcg by mouth daily.   Melatonin 10 MG TABS Take 10 mg by mouth at bedtime.    metFORMIN (GLUCOPHAGE-XR) 500 MG 24 hr tablet Take 500 mg by mouth 2 (two) times daily.   metoprolol succinate (TOPROL-XL) 100 MG 24 hr tablet Take 100 mg by mouth daily. *Hold for low blood pressure*   Multiple Vitamins-Minerals (PRESERVISION AREDS 2 PO) Take 1 tablet by mouth 2 (two) times daily.   omeprazole (PRILOSEC) 40 MG capsule TAKE 1 CAPSULE BY MOUTH  TWICE DAILY BEFORE A MEAL (Patient not taking: Reported on 02/01/2021)   ONETOUCH VERIO test strip    OVER THE COUNTER MEDICATION OTC Iron daily (Patient not taking: Reported on 02/01/2021)   pyridOXINE (VITAMIN B-6) 50 MG tablet Take 1 tablet (50 mg total) by mouth daily.   tiZANidine (ZANAFLEX) 4 MG tablet Take 4 mg by mouth 3 (three) times daily as needed for muscle spasms.    Vitamin D, Ergocalciferol, (DRISDOL) 1.25 MG (50000 UNIT) CAPS capsule Take 50,000 Units by mouth every 7 (seven) days.   VITAMIN E PO Take 1 capsule by mouth every evening. (Patient not taking: Reported on 02/01/2021)   No current facility-administered medications for this visit. (Other)      REVIEW OF SYSTEMS:    ALLERGIES Allergies  Allergen Reactions   Amoxicillin Hives, Shortness Of Breath and Rash    Did it involve swelling of the  face/tongue/throat, SOB, or low BP? Yes Did it involve sudden or severe rash/hives, skin peeling, or any reaction on the inside of your mouth or nose? Unknown Did you need to seek medical attention at a hospital or doctor's office? Yes When did it last happen?~15-20 years ago       If all above answers are "NO", may proceed with cephalosporin use. .    Ampicillin Hives, Shortness Of Breath and Rash    Did it involve swelling of the face/tongue/throat, SOB, or low BP? Yes Did it involve sudden or severe rash/hives, skin peeling, or any reaction on the inside of your mouth or nose? Unknown Did you need to seek medical attention at a hospital or doctor's office? Yes When did it last happen?~15-20 years ago       If all above answers are "NO", may proceed with cephalosporin use. .   Methotrexate Derivatives Other (See Comments)    hepatoxicity   Dust Mite Extract    Molds & Smuts    Other     NON-STEROIDAL ANTI-INFLAMMATORY DRUG CAUSED GASTRIC BLEED. Trees-especially pines.   Prevacid [Lansoprazole] Diarrhea    PAST MEDICAL HISTORY Past Medical History:  Diagnosis Date   Asthma    Cirrhosis ([place]) 06/2019   Immune to Hep A. Has completed Hep B vaccine (May 2021)   Diabetes [facility])    Essential hypertension    GERD (gastroesophageal reflux disease)    History of GI bleed    Hypothyroidism    Macular degeneration    wet in right, dry in left, has injections for this.   Microcytic anemia    Palpitations    Peptic ulcer disease    Postural orthostatic tachycardia syndrome    Mildly orthostatic results tilt table test 2005  - Dr. 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    ESOPHAGOGASTRODUODENOSCOPY (EGD) WITH PROPOFOL N/A 11/04/2019   Procedure: ESOPHAGOGASTRODUODENOSCOPY (EGD) WITH PROPOFOL;  Surgeon: Daneil Dolin, MD; normal esophagus s/p empiric dilation, abnormal appearing gastric mucosa s/p biopsy, abnormal  duodenal mucosa s/p biopsied, no ulcer, infiltrating process, or portal gastropathy.  Duodenal biopsies benign.  Gastric biopsy with reactive gastropathy with erosions, no H. pylori.    MALONEY DILATION N/A 11/04/2019   Procedure: Venia Minks DILATION;  Surgeon: Daneil Dolin, MD;  Location: [facility];  Service: Endoscopy;  Laterality: N/A;   TILT TABLE STUDY  2005   [facility]   TRIGGER FINGER RELEASE Right    2nd finger    FAMILY HISTORY Family History  Problem Relation Age of Onset   Heart attack Father        Age 75   Arrhythmia Sister        WPW s/p ablation- corrected at [facility]   Alzheimer's disease Mother    Pneumonia Mother        Aspiration   Heart attack Brother    Heart attack Maternal Grandfather    Colon cancer Maternal Aunt        diagnosed at 35   Breast cancer Maternal Grandmother     SOCIAL HISTORY Social History   Tobacco Use   Smoking status: Never   Smokeless tobacco: Never  Vaping Use   Vaping Use: Never used  Substance Use Topics   Alcohol use: No    Alcohol/week: 0.0 standard drinks   Drug use: No         OPHTHALMIC EXAM:  Base Eye Exam     Visual Acuity (ETDRS)       Right Left   Dist [place] 20/100 20/20   Dist ph [place] 20/70 -2          Tonometry (Tonopen, 10:32 AM)       Right Left   Pressure 17 14         Pupils       Pupils Dark Light Shape React APD   Right PERRL 3 2 Round Brisk None   Left PERRL 3 2 Round Brisk None         Visual Fields (Counting fingers)       Left Right    Full  Full         Extraocular Movement       Right Left    Full Full         Neuro/Psych     Oriented x3: Yes   Mood/Affect: Normal         Dilation     Right eye: 1.0% Mydriacyl, 2.5% Phenylephrine @ 10:32 AM           Slit Lamp and Fundus Exam     External Exam       Right Left   External Normal Normal         Slit Lamp Exam       Right Left   Lids/Lashes Normal Normal   Conjunctiva/Sclera White and quiet White and quiet   Cornea Clear Clear   Anterior Chamber Deep and quiet Deep and quiet   Iris Round and reactive, TI defect 530-630 Round and reactive   Lens Posterior chamber intraocular lens Posterior chamber intraocular lens   Anterior Vitreous Normal Normal         Fundus Exam       Right Left   Posterior Vitreous Posterior vitreous detachment Posterior vitreous detachment   Disc Normal Normal   C/D Ratio 0.5 0.5   Macula Retinal pigment epithelial atrophy in the FAZ, Advanced age related macular degeneration, Hard drusen, no exudates, no hemorrhage, Mottling, Retinal pigment epithelial mottling, Geographic  atrophy, Soft drusen, no macular thickening Hard drusen, no hemorrhage, no macular thickening, no exudates, Epiretinal membrane   Vessels Normal Normal   Periphery Normal Normal            IMAGING AND PROCEDURES  Imaging and Procedures for 03/01/21  OCT, Retina - OU - Both Eyes       Right Eye Quality was good. Scan locations included subfoveal. Central Foveal Thickness: 185. Progression has been stable. Findings include abnormal foveal contour, subretinal fluid, outer retinal atrophy, inner retinal atrophy, central retinal atrophy, retinal drusen , no SRF, no IRF.   Left Eye Quality was good. Scan locations included subfoveal. Central Foveal Thickness: 277. Progression has been stable. Findings include epiretinal membrane, no SRF, no IRF, retinal drusen .   Notes Central foveal atrophy with chronic residual subretinal fluid Yet  stable overall at 11-monthinterval post Avastin.   Still no signs of recurrence of No risk of scotoma enlargement.  OD. CNVM will continue to monitor and observe OD  OS with minor epiretinal membrane nasal to the fovea not center involved not impactful to vision       Intravitreal Injection, Pharmacologic Agent - OD - Right Eye       Time Out 03/01/2021. 11:13 AM. Confirmed correct patient, procedure, site, and patient consented.   Anesthesia Topical anesthesia was used. Anesthetic medications included [place] 3.5%.   Procedure Preparation included 10% betadine to eyelids, 5% betadine to ocular surface, Ofloxacin . A 30 gauge needle was used.   Injection: 2.5 mg bevacizumab 2.5 MG/0.1ML   Route: Intravitreal, Site: Right Eye   NDC: [phone] Lot: [id number]  Post-op Post injection exam found visual acuity of at least counting fingers. The patient tolerated the procedure well. There were no complications. The patient received written and verbal post procedure care education. Post injection medications were not given.              ASSESSMENT/PLAN:  Exudative age-related macular degeneration of right eye with active choroidal neovascularization (HCC) OD, with much less active wet AMD yet with residual atrophy in the in the macula with loss of the photoreceptor layer. No active intraretinal fluid.  Subretinal scarring accounts for acuity.  Currently at 13-week follow-up.  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 Exudative age-related macular degeneration of right eye with active choroidal neovascularization (HCC)  H35.3211 OCT, Retina - OU - Both Eyes    Intravitreal Injection, Pharmacologic Agent - OD - Right Eye    bevacizumab (AVASTIN) SOSY 2.5 mg    2. Macular  pucker, left eye  H35.372     3. Intermediate stage nonexudative age-related macular degeneration of left eye  H35.3122       1.  OS, stable with minor epiretinal membrane and intermediate ARMD no signs of CNVM progression OS  2.  OD today, no signs of recurrence of CNVM at longer 13-week follow-up interval.  Central low foveal atrophy accounts for acuity will continue to observe this portion of the disease after treating today with Avastin OD and extending interval examination next to 16 weeks  3.  Ophthalmic Meds Ordered this visit:  Meds ordered this encounter  Medications   bevacizumab (AVASTIN) SOSY 2.5 mg       Return  in about 16 weeks (around 06/21/2021) for DILATE OU, AVASTIN OCT, OD.  There are no Patient Instructions on file for this visit.   Explained the diagnoses, plan, and follow up with the patient and they expressed understanding.  Patient expressed understanding of the importance of proper follow up care.   Clent Demark Aisling Emigh M.D. Diseases & Surgery of the Retina and Vitreous Retina & Diabetic [place] 03/01/21     Abbreviations: M myopia (nearsighted); A astigmatism; H hyperopia (farsighted); P presbyopia; Mrx spectacle prescription;  CTL contact lenses; OD right eye; OS left eye; OU both eyes  XT exotropia; ET esotropia; PEK punctate epithelial keratitis; PEE punctate epithelial erosions; DES dry eye syndrome; MGD meibomian gland dysfunction; ATs artificial tears; PFAT's preservative free artificial tears; [place] nuclear sclerotic cataract; PSC posterior subcapsular cataract; ERM epi-retinal membrane; PVD posterior vitreous detachment; RD retinal detachment; DM diabetes mellitus; DR diabetic retinopathy; NPDR non-proliferative diabetic retinopathy; PDR proliferative diabetic retinopathy; CSME clinically significant macular edema; DME diabetic macular edema; dbh dot blot hemorrhages; CWS cotton wool spot; POAG primary open angle glaucoma; C/D cup-to-disc ratio; HVF  humphrey visual field; GVF goldmann visual field; OCT optical coherence tomography; IOP intraocular pressure; BRVO Branch retinal vein occlusion; CRVO central retinal vein occlusion; CRAO central retinal artery occlusion; BRAO branch retinal artery occlusion; RT retinal tear; SB scleral buckle; PPV pars plana vitrectomy; VH Vitreous hemorrhage; PRP panretinal laser photocoagulation; IVK intravitreal kenalog; VMT vitreomacular traction; MH Macular hole;  NVD neovascularization of the disc; NVE neovascularization elsewhere; AREDS age related eye disease study; ARMD age related macular degeneration; POAG primary open angle glaucoma; EBMD epithelial/anterior basement membrane dystrophy; ACIOL anterior chamber intraocular lens; IOL intraocular lens; PCIOL posterior chamber intraocular lens; Phaco/IOL phacoemulsification with intraocular lens placement; [place] photorefractive keratectomy; LASIK laser assisted in situ keratomileusis; HTN hypertension; DM diabetes mellitus; COPD chronic obstructive pulmonary disease

## 2021-03-01 NOTE — Assessment & Plan Note (Signed)
No signs of a CNVM by OCT

## 2021-03-01 NOTE — Assessment & Plan Note (Signed)
No change left eye by OCT will continue to monitor and observe

## 2021-03-01 NOTE — Assessment & Plan Note (Signed)
OD, with much less active wet AMD yet with residual atrophy in the in the macula with loss of the photoreceptor layer. No active intraretinal fluid.  Subretinal scarring accounts for acuity.  Currently at 13-week follow-up.  We will extend interval examination next

## 2021-03-02 ENCOUNTER — Other Ambulatory Visit: Payer: Self-pay

## 2021-03-02 DIAGNOSIS — Z8719 Personal history of other diseases of the digestive system: Secondary | ICD-10-CM

## 2021-03-02 DIAGNOSIS — R772 Abnormality of alphafetoprotein: Secondary | ICD-10-CM

## 2021-03-08 ENCOUNTER — Other Ambulatory Visit: Payer: Self-pay

## 2021-03-08 ENCOUNTER — Ambulatory Visit: Payer: Medicare Other | Admitting: Infectious Disease

## 2021-03-08 VITALS — BP 135/79 | HR 58 | Temp 97.3°F | Resp 16 | Ht 60.0 in | Wt 208.0 lb

## 2021-03-08 DIAGNOSIS — Z227 Latent tuberculosis: Secondary | ICD-10-CM

## 2021-03-08 DIAGNOSIS — H353113 Nonexudative age-related macular degeneration, right eye, advanced atrophic without subfoveal involvement: Secondary | ICD-10-CM | POA: Diagnosis not present

## 2021-03-08 DIAGNOSIS — K7460 Unspecified cirrhosis of liver: Secondary | ICD-10-CM | POA: Diagnosis not present

## 2021-03-08 DIAGNOSIS — L409 Psoriasis, unspecified: Secondary | ICD-10-CM

## 2021-03-08 NOTE — Progress Notes (Signed)
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  She has history of an uncle on her father side having pulmonary tuberculosis in the 12s.  She was exposed to him at that time and later in the 1980s had a positive skin test x2 to tuberculosis.  She was never treated for latent tuberculosis.  She follows with Dr. Lenna Rocha for her psoriasis and psoriatic arthritis.   Patient is currently not on any active drugs for psoriasis.  Ideally her rheumatologist would like to be able to use more potent immunosuppressive therapy including biologic therapy possibly.  However certainly I agree that her latent TB needs to be treated first.  She has had a chest x-ray that is without evidence of cardiopulmonary disease and she does not have any symptoms to suggest active tuberculosis.  We considered different options and she would prefer to go with isoniazid to minimize risk of hepatotoxicity given her pre-existing cirrhosis of the liver that developed while she was getting methotrexate therapy.  She is tolerating the [place] and taking this religiously with pyridoxine. No side effects noted.   Past Medical History:  Diagnosis Date   Asthma    Cirrhosis ([place]) 06/2019   Immune to Hep A. Has completed Hep B vaccine (May 2021)   Diabetes [facility])    Essential hypertension    GERD (gastroesophageal reflux disease)    History of GI bleed    Hypothyroidism    Macular degeneration    wet in right, dry in left, has injections for this.   Microcytic anemia    Palpitations    Peptic ulcer disease    Postural orthostatic tachycardia syndrome    Mildly orthostatic results tilt table test 2005  - Dr. 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    ESOPHAGOGASTRODUODENOSCOPY (EGD) WITH PROPOFOL N/A 11/04/2019   Procedure: ESOPHAGOGASTRODUODENOSCOPY (EGD) WITH PROPOFOL;  Surgeon: Kristine Dolin, MD; normal esophagus s/p empiric dilation, abnormal appearing gastric mucosa s/p biopsy, abnormal duodenal mucosa s/p biopsied, no ulcer, infiltrating process, or portal gastropathy.  Duodenal biopsies benign.  Gastric biopsy with reactive gastropathy with erosions, no H. pylori.    Kristine Rocha DILATION N/A 11/04/2019   Procedure: Kristine Rocha DILATION;  Surgeon: Kristine Dolin, MD;  Location: [facility];  Service: Endoscopy;  Laterality: N/A;   TILT TABLE STUDY  2005   Kristine Rocha   TRIGGER FINGER RELEASE Right    2nd finger    Family History  Problem Relation Age of Onset   Heart attack Father        Age 44   Arrhythmia Sister  WPW s/p ablation- corrected at [facility]   Alzheimer's disease Mother    Pneumonia Mother        Aspiration   Heart attack Brother    Heart attack Maternal Grandfather    Colon cancer Maternal Aunt        diagnosed at 33   Breast cancer Maternal Grandmother       Social History   Socioeconomic History   Marital status: Married    Spouse name: Not on file   Number of children: Not on file   Years of education: Not on file   Highest education level: Not on file  Occupational History   Not on file  Tobacco Use   Smoking status: Never   Smokeless tobacco: Never  Vaping Use    Vaping Use: Never used  Substance and Sexual Activity   Alcohol use: No    Alcohol/week: 0.0 standard drinks   Drug use: No   Sexual activity: Yes  Other Topics Concern   Not on file  Social History Narrative   Not on file   Social Determinants of Health   Financial Resource Strain: Not on file  Food Insecurity: Not on file  Transportation Needs: Not on file  Physical Activity: Not on file  Stress: Not on file  Social Connections: Not on file    Allergies  Allergen Reactions   Amoxicillin Hives, Shortness Of Breath and Rash    Did it involve swelling of the face/tongue/throat, SOB, or low BP? Yes Did it involve sudden or severe rash/hives, skin peeling, or any reaction on the inside of your mouth or nose? Unknown Did you need to seek medical attention at a hospital or doctor's office? Yes When did it last happen?~15-20 years ago       If all above answers are "NO", may proceed with cephalosporin use. .    Ampicillin Hives, Shortness Of Breath and Rash    Did it involve swelling of the face/tongue/throat, SOB, or low BP? Yes Did it involve sudden or severe rash/hives, skin peeling, or any reaction on the inside of your mouth or nose? Unknown Did you need to seek medical attention at a hospital or doctor's office? Yes When did it last happen?~15-20 years ago       If all above answers are "NO", may proceed with cephalosporin use. .   Methotrexate Derivatives Other (See Comments)    hepatoxicity   Dust Mite Extract    Molds & Smuts    Other     NON-STEROIDAL ANTI-INFLAMMATORY DRUG CAUSED GASTRIC BLEED. Trees-especially pines.   Prevacid [Lansoprazole] Diarrhea     Current Outpatient Medications:    atorvastatin (LIPITOR) 10 MG tablet, Take 10 mg by mouth every evening. , Disp: , Rfl:    cetirizine (ZYRTEC) 10 MG tablet, Take 10 mg by mouth daily., Disp: , Rfl:    Cyanocobalamin (VITAMIN B-12 PO), Take by mouth daily., Disp: , Rfl:    diphenhydrAMINE (BENADRYL) 25  MG tablet, Take 25 mg by mouth at bedtime. As needed, Disp: , Rfl:    Ferrous Sulfate (IRON) 325 (65 Fe) MG TABS, Take 1 tablet by mouth daily., Disp: , Rfl:    glipiZIDE (GLUCOTROL XL) 5 MG 24 hr tablet, Take 5 mg by mouth daily., Disp: , Rfl:    hyoscyamine (LEVSIN SL) 0.125 MG SL tablet, Place under the tongue 3 (three) times daily as needed., Disp: , Rfl:    isoniazid (NYDRAZID) 300 MG tablet, Take 1 tablet (300 mg  total) by mouth daily., Disp: 30 tablet, Rfl: 8   levothyroxine (SYNTHROID) 88 MCG tablet, Take 88 mcg by mouth daily., Disp: , Rfl:    Melatonin 10 MG TABS, Take 10 mg by mouth at bedtime. , Disp: , Rfl:    metFORMIN (GLUCOPHAGE-XR) 500 MG 24 hr tablet, Take 500 mg by mouth 2 (two) times daily., Disp: , Rfl: 0   metoprolol succinate (TOPROL-XL) 100 MG 24 hr tablet, Take 100 mg by mouth daily. *Hold for low blood pressure*, Disp: , Rfl:    Multiple Vitamins-Minerals (PRESERVISION AREDS 2 PO), Take 1 tablet by mouth 2 (two) times daily., Disp: , Rfl:    omeprazole (PRILOSEC) 40 MG capsule, TAKE 1 CAPSULE BY MOUTH  TWICE DAILY BEFORE A MEAL, Disp: 180 capsule, Rfl: 2   ONETOUCH VERIO test strip, , Disp: , Rfl: 0   OVER THE COUNTER MEDICATION, OTC Iron daily, Disp: , Rfl:    pyridOXINE (VITAMIN B-6) 50 MG tablet, Take 1 tablet (50 mg total) by mouth daily., Disp: 30 tablet, Rfl: 8   tiZANidine (ZANAFLEX) 4 MG tablet, Take 4 mg by mouth 3 (three) times daily as needed for muscle spasms. , Disp: , Rfl:    Vitamin D, Ergocalciferol, (DRISDOL) 1.25 MG (50000 UNIT) CAPS capsule, Take 50,000 Units by mouth every 7 (seven) days., Disp: , Rfl:    VITAMIN E PO, Take 1 capsule by mouth every evening., Disp: , Rfl:    Review of Systems  Constitutional:  Negative for activity change, appetite change, chills, diaphoresis, fatigue, fever and unexpected weight change.  HENT:  Negative for congestion, rhinorrhea, sinus pressure, sneezing, sore throat and trouble swallowing.   Eyes:  Negative for  photophobia and visual disturbance.  Respiratory:  Negative for cough, chest tightness, shortness of breath, wheezing and stridor.   Cardiovascular:  Negative for chest pain, palpitations and leg swelling.  Gastrointestinal:  Negative for abdominal distention, abdominal pain, anal bleeding, blood in stool, constipation, diarrhea, nausea and vomiting.  Genitourinary:  Negative for difficulty urinating, dysuria, flank pain and hematuria.  Musculoskeletal:  Positive for myalgias. Negative for arthralgias, back pain, gait problem and joint swelling.  Skin:  Negative for color change, pallor, rash and wound.  Neurological:  Negative for dizziness, tremors, weakness and light-headedness.  Hematological:  Negative for adenopathy. Does not bruise/bleed easily.  Psychiatric/Behavioral:  Negative for agitation, behavioral problems, confusion, decreased concentration, dysphoric mood and sleep disturbance.       Objective:   Physical Exam Constitutional:      General: She is not in acute distress.    Appearance: Normal appearance. She is well-developed. She is not ill-appearing or diaphoretic.  HENT:     Head: Normocephalic and atraumatic.     Right Ear: Hearing and external ear normal.     Left Ear: Hearing and external ear normal.     Nose: No nasal deformity or rhinorrhea.  Eyes:     General: No scleral icterus.       Right eye: No discharge.        Left eye: No discharge.     Extraocular Movements: Extraocular movements intact.     Conjunctiva/sclera: Conjunctivae normal.     Right eye: Right conjunctiva is not injected.     Left eye: Left conjunctiva is not injected.     Pupils: Pupils are equal, round, and reactive to light.  Neck:     Vascular: No JVD.  Cardiovascular:     Rate and Rhythm: Normal rate and  regular rhythm.     Heart sounds: S1 normal and S2 normal.  Pulmonary:     Effort: Pulmonary effort is normal. No respiratory distress.     Breath sounds: No wheezing.  Abdominal:      General: There is no distension.     Palpations: Abdomen is soft.     Tenderness: There is no abdominal tenderness.  Musculoskeletal:        General: Normal range of motion.     Right shoulder: Normal.     Left shoulder: Normal.     Cervical back: Normal range of motion and neck supple.     Right hip: Normal.     Left hip: Normal.     Right knee: Normal.     Left knee: Normal.  Lymphadenopathy:     Head:     Right side of head: No submandibular, preauricular or posterior auricular adenopathy.     Left side of head: No submandibular, preauricular or posterior auricular adenopathy.     Cervical: No cervical adenopathy.     Right cervical: No superficial or deep cervical adenopathy.    Left cervical: No superficial or deep cervical adenopathy.  Skin:    General: Skin is warm and dry.     Coloration: Skin is not pale.     Findings: No abrasion, bruising, ecchymosis, erythema, lesion or rash.     Nails: There is no clubbing.  Neurological:     General: No focal deficit present.     Mental Status: She is alert and oriented to person, place, and time.     Sensory: No sensory deficit.     Coordination: Coordination normal.     Gait: Gait normal.  Psychiatric:        Attention and Perception: She is attentive.        Mood and Affect: Mood normal.        Speech: Speech normal.        Behavior: Behavior normal. Behavior is cooperative.        Thought Content: Thought content normal.        Judgment: Judgment normal.          Assessment & Plan:   Latent TB:  I am continuing prescriptions of isoniazid and pyridoxine I will check safety labs with CMP today  Psoriatic arthritis: currently only managing symptoms with zanafex  She will see Dr Trudie Reed again in another month   Cirrhosis: we will chec CMP on INH today  Macular degeneration: has seen  Dr Zadie Rhine again on August 8th.

## 2021-03-09 LAB — CBC WITH DIFFERENTIAL/PLATELET
Absolute Monocytes: 442 cells/uL (ref 200–950)
Basophils Absolute: 20 cells/uL (ref 0–200)
Basophils Relative: 0.3 %
Eosinophils Absolute: 0 cells/uL — ABNORMAL LOW (ref 15–500)
Eosinophils Relative: 0 %
HCT: 34.2 % — ABNORMAL LOW (ref 35.0–45.0)
Hemoglobin: 11 g/dL — ABNORMAL LOW (ref 11.7–15.5)
Lymphs Abs: 1671 cells/uL (ref 850–3900)
MCH: 28.7 pg (ref 27.0–33.0)
MCHC: 32.2 g/dL (ref 32.0–36.0)
MCV: 89.3 fL (ref 80.0–100.0)
MPV: 10.4 fL (ref 7.5–12.5)
Monocytes Relative: 6.8 %
Neutro Abs: 4368 cells/uL (ref 1500–7800)
Neutrophils Relative %: 67.2 %
Platelets: 187 10*3/uL (ref 140–400)
RBC: 3.83 10*6/uL (ref 3.80–5.10)
RDW: 12.9 % (ref 11.0–15.0)
Total Lymphocyte: 25.7 %
WBC: 6.5 10*3/uL (ref 3.8–10.8)

## 2021-03-09 LAB — COMPLETE METABOLIC PANEL WITH GFR
AG Ratio: 1.4 (calc) (ref 1.0–2.5)
ALT: 30 U/L — ABNORMAL HIGH (ref 6–29)
AST: 43 U/L — ABNORMAL HIGH (ref 10–35)
Albumin: 3.9 g/dL (ref 3.6–5.1)
Alkaline phosphatase (APISO): 97 U/L (ref 37–153)
BUN: 15 mg/dL (ref 7–25)
CO2: 26 mmol/L (ref 20–32)
Calcium: 8.8 mg/dL (ref 8.6–10.4)
Chloride: 103 mmol/L (ref 98–110)
Creat: 0.79 mg/dL (ref 0.50–1.05)
Globulin: 2.7 g/dL (calc) (ref 1.9–3.7)
Glucose, Bld: 137 mg/dL — ABNORMAL HIGH (ref 65–99)
Potassium: 4.9 mmol/L (ref 3.5–5.3)
Sodium: 137 mmol/L (ref 135–146)
Total Bilirubin: 0.3 mg/dL (ref 0.2–1.2)
Total Protein: 6.6 g/dL (ref 6.1–8.1)
eGFR: 82 mL/min/{1.73_m2} (ref >=60)

## 2021-03-24 DIAGNOSIS — E1165 Type 2 diabetes mellitus with hyperglycemia: Secondary | ICD-10-CM | POA: Diagnosis not present

## 2021-03-25 DIAGNOSIS — K7460 Unspecified cirrhosis of liver: Secondary | ICD-10-CM | POA: Diagnosis not present

## 2021-03-25 DIAGNOSIS — M1009 Idiopathic gout, multiple sites: Secondary | ICD-10-CM | POA: Diagnosis not present

## 2021-03-25 DIAGNOSIS — L409 Psoriasis, unspecified: Secondary | ICD-10-CM | POA: Diagnosis not present

## 2021-03-25 DIAGNOSIS — R768 Other specified abnormal immunological findings in serum: Secondary | ICD-10-CM | POA: Diagnosis not present

## 2021-03-25 DIAGNOSIS — M150 Primary generalized (osteo)arthritis: Secondary | ICD-10-CM | POA: Diagnosis not present

## 2021-03-25 DIAGNOSIS — M2550 Pain in unspecified joint: Secondary | ICD-10-CM | POA: Diagnosis not present

## 2021-04-06 ENCOUNTER — Telehealth: Payer: Self-pay | Admitting: Internal Medicine

## 2021-04-06 NOTE — Telephone Encounter (Signed)
Letter mailed

## 2021-04-06 NOTE — Telephone Encounter (Signed)
Recall for mri liver

## 2021-04-13 ENCOUNTER — Other Ambulatory Visit: Payer: Self-pay

## 2021-04-13 DIAGNOSIS — Z227 Latent tuberculosis: Secondary | ICD-10-CM

## 2021-04-13 MED ORDER — ISONIAZID 300 MG PO TABS: 300.0000 mg | ORAL_TABLET | Freq: Every day | ORAL | 8 refills | Status: DC

## 2021-04-19 DIAGNOSIS — M1711 Unilateral primary osteoarthritis, right knee: Secondary | ICD-10-CM | POA: Diagnosis not present

## 2021-04-23 DIAGNOSIS — E1165 Type 2 diabetes mellitus with hyperglycemia: Secondary | ICD-10-CM | POA: Diagnosis not present

## 2021-04-28 NOTE — Progress Notes (Signed)
Referring Provider: Glenda Chroman, MD Primary Care Physician:  Glenda Chroman, MD Primary GI Physician: Dr. Gala Romney  Chief Complaint  Patient presents with   Cirrhosis   Anemia    HPI:   Kristine Rocha is a 65 y.o. female presenting today for follow-up.  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  Extensive laboratory evaluation of cirrhosis with no significant findings aside from positive ANA. TCS and EGD in September 2019 at [facility] for IDA was normal.   Repeat EGD 11/04/2019 with normal-appearing esophagus s/p dilation, abnormal appearing stomach of uncertain significance, gastric mucosa with some plaque appearance in the antrum and prepyloric area, no ulcer or infiltrating process, no portal gastropathy, patent pylorus, abnormal duodenum of uncertain significance with prominent villi in the second and third portion of the duodenum s/p biopsy.  Duodenal biopsy was benign.  Gastric biopsy with reactive gastropathy with erosions, no H. pylori.  Recommended repeat EGD in 2 years.  Previously offered Givens capsule to complete GI evaluation of IDA, but patient declined.  Last office 10/28/2020. Reported right flank pain radiating to the top of her right buttock with walking.  No associated nausea, vomiting, constipation, diarrhea.  Reported history of kidney stones.  She was not interested in any sort of imaging and denied dysuria.  Overall, suspected MSK etiology.  She did report uncontrolled diabetes with hemoglobin A1c 9.2 s/p increase in metformin and starting glipizide.  She was working on dietary changes.  No signs or symptoms of decompensated liver disease.  GERD well controlled on omeprazole twice daily.  Intermittent loose stools with spicy foods which respond well to hyoscyamine, otherwise bowels moving well. Denied BRBPR or melena.  Reported her hemoglobin was 11 recently with PCP.  Had not been taking iron routinely.   Again discussed Givens capsule, but she declined.  She also requested to hold off on iron panel.  Plan included UA, urine culture, CMP, RUQ ultrasound, continue omeprazole twice daily, counseled on fatty liver with the importance of healthy diet/weight loss, resume iron daily, plan for routine cirrhosis labs, iron panel, B12, and folate in 8 weeks, and follow-up in 6 months.  UA/urine culture was not completed.  RUQ ultrasound 11/02/2020: Cirrhotic liver without focal liver lesions.  Labs completed 12/28/2020: Hemoglobin improved to 12.2, ALT 37 (H), AST, alk phos, T bili within normal limits, INR within normal limits, AFP elevated at 11.8.  Iron 44, ferritin 22, saturation 11% (L), B12 402, folate >20.  MELD stable at 8.  Recommended continuing iron daily.  Discussed elevated AFP with Dr. Gala Romney.  Noted prior MRI for elevated AFP with no significant findings.  Most recent ultrasound in April with no significant findings.  Recommended repeat AFP in 3 months and replaced next ultrasound with MRI liver.  Repeat AFP has not been completed. MRI not yet arranged.  Recall letter was sent to patient.  Most recent labs 03/08/2021: CBC with hemoglobin down to 11.0 with normocytic indices.  CMP with AST 43, ALT 30.  Today:  Cirrhosis:  Husband has to have cataract surgery in November and December. States she can't have MRI until January. Can't have [place] either. She has multiple other appointments between herself and her husband and they are dependent on her daughter right now.    Clinically, she is doing well.  Denies swelling in her abdomen or lower extremities, yellowing of the eyes or skin, confusion/change in mental status, BRBPR,  melena, hematuria, or any other obvious blood loss.  She does bruise easily.  Last A1C was 6.6 in July.   MELD: Stable at 8 in June 2022.  Routine labs: Due December 2022. Orin screening: Due for MRI abdomen with liver protocol now due to elevated AFP.  Variceal screening: Due  for EGD April 2023.  GERD: Well controlled on omeprazole 40 mg twice daily.  Denies nausea, vomiting, dysphagia, abdominal pain.   IDA: Taking iron every day.  Bowels are moving well without overt bleeding. No NSAIDs.   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  States she will be treated for 9 months.  Past Medical History:  Diagnosis Date   Asthma    Cirrhosis ([place]) 06/2019   Immune to Hep A. Has completed Hep B vaccine (May 2021)   Diabetes [facility])    Essential hypertension    GERD (gastroesophageal reflux disease)    History of GI bleed    Hypothyroidism    Macular degeneration    wet in right, dry in left, has injections for this.   Microcytic anemia    Palpitations    Peptic ulcer disease    Postural orthostatic tachycardia syndrome    Mildly orthostatic results tilt table test 2005  - Dr. Caryl Comes   Psoriasis    Rheumatoid arthritis ([place])    TB lung, latent 02/01/2021    Past Surgical History:  Procedure Laterality Date   ABDOMINAL HERNIA REPAIR  15 years ago   umbilical with mesh   ABDOMINAL HYSTERECTOMY  20 years ago    BIOPSY  11/04/2019   Procedure: BIOPSY;  Surgeon: Daneil Dolin, MD;  Location: [facility];  Service: Endoscopy;;  duodenum gastric   CATARACT EXTRACTION, BILATERAL Bilateral    CHOLECYSTECTOMY     COLONOSCOPY WITH ESOPHAGOGASTRODUODENOSCOPY (EGD)  03/2018   [facility]; indication:IDA; Normal exam.    ESOPHAGOGASTRODUODENOSCOPY (EGD) WITH PROPOFOL N/A 11/04/2019   Procedure: ESOPHAGOGASTRODUODENOSCOPY (EGD) WITH PROPOFOL;  Surgeon: Daneil Dolin, MD; normal esophagus s/p empiric dilation, abnormal appearing gastric mucosa s/p biopsy, abnormal duodenal mucosa s/p biopsied, no ulcer, infiltrating process, or portal gastropathy.  Duodenal biopsies benign.  Gastric biopsy with reactive gastropathy with erosions, no H. pylori.    MALONEY DILATION N/A 11/04/2019   Procedure: Venia Minks DILATION;  Surgeon: Daneil Dolin, MD;  Location: [facility];   Service: Endoscopy;  Laterality: N/A;   TILT TABLE STUDY  2005   [facility]   TRIGGER FINGER RELEASE Right    2nd finger    Current Outpatient Medications  Medication Sig Dispense Refill   atorvastatin (LIPITOR) 10 MG tablet Take 10 mg by mouth every evening.      cetirizine (ZYRTEC) 10 MG tablet Take 10 mg by mouth daily.     Cyanocobalamin (VITAMIN B-12 PO) Take by mouth daily.     diphenhydrAMINE (BENADRYL) 25 MG tablet Take 25 mg by mouth at bedtime. As needed     Ferrous Sulfate (IRON) 325 (65 Fe) MG TABS Take 1 tablet by mouth daily.     glipiZIDE (GLUCOTROL XL) 5 MG 24 hr tablet Take 10 mg by mouth daily.     hyoscyamine (LEVSIN SL) 0.125 MG SL tablet Place under the tongue 3 (three) times daily as needed.     isoniazid (NYDRAZID) 300 MG tablet Take 1 tablet (300 mg total) by mouth daily. 30 tablet 8   levothyroxine (SYNTHROID) 88 MCG tablet Take 88 mcg by mouth daily.     Melatonin  10 MG TABS Take 10 mg by mouth at bedtime.      metFORMIN (GLUCOPHAGE-XR) 500 MG 24 hr tablet Take 500 mg by mouth 2 (two) times daily.  0   metoprolol succinate (TOPROL-XL) 100 MG 24 hr tablet Take 100 mg by mouth daily. *Hold for low blood pressure*     Multiple Vitamins-Minerals (PRESERVISION AREDS 2 PO) Take 1 tablet by mouth 2 (two) times daily.     Multiple Vitamins-Minerals (ZINC PO) Take by mouth daily.     omeprazole (PRILOSEC) 40 MG capsule TAKE 1 CAPSULE BY MOUTH  TWICE DAILY BEFORE A MEAL 180 capsule 2   ONETOUCH VERIO test strip   0   OVER THE COUNTER MEDICATION OTC Iron daily     pyridOXINE (VITAMIN B-6) 50 MG tablet Take 1 tablet (50 mg total) by mouth daily. 30 tablet 8   tiZANidine (ZANAFLEX) 4 MG tablet Take 4 mg by mouth 3 (three) times daily as needed for muscle spasms.      Vitamin D, Ergocalciferol, (DRISDOL) 1.25 MG (50000 UNIT) CAPS capsule Take 50,000 Units by mouth every 7 (seven) days.     VITAMIN E PO Take 1 capsule by mouth every evening.     No current facility-administered  medications for this visit.    Allergies as of 04/29/2021 - Review Complete 04/29/2021  Allergen Reaction Noted   Amoxicillin Hives, Shortness Of Breath, and Rash 11/19/2014   Ampicillin Hives, Shortness Of Breath, and Rash 11/19/2014   Methotrexate derivatives Other (See Comments) 02/01/2021   Dust mite extract  10/28/2020   Molds & smuts  10/28/2020   Other  11/19/2014   Prevacid [lansoprazole] Diarrhea 06/24/2019    Family History  Problem Relation Age of Onset   Heart attack Father        Age 88   Arrhythmia Sister        WPW s/p ablation- corrected at [facility]   Alzheimer's disease Mother    Pneumonia Mother        Aspiration   Heart attack Brother    Heart attack Maternal Grandfather    Colon cancer Maternal Aunt        diagnosed at 54   Breast cancer Maternal Grandmother     Social History   Socioeconomic History   Marital status: Married    Spouse name: Not on file   Number of children: Not on file   Years of education: Not on file   Highest education level: Not on file  Occupational History   Not on file  Tobacco Use   Smoking status: Never   Smokeless tobacco: Never  Vaping Use   Vaping Use: Never used  Substance and Sexual Activity   Alcohol use: No    Alcohol/week: 0.0 standard drinks   Drug use: No   Sexual activity: Yes  Other Topics Concern   Not on file  Social History Narrative   Not on file   Social Determinants of Health   Financial Resource Strain: Not on file  Food Insecurity: Not on file  Transportation Needs: Not on file  Physical Activity: Not on file  Stress: Not on file  Social Connections: Not on file    Review of Systems: Gen: Denies fever, chills, cold or flulike symptoms, presyncope, syncope. CV: Denies chest pain, palpitations. Resp: Denies dyspnea or cough.  GI: See HPI Heme: See HPI  Physical Exam: BP (!) 159/88   Pulse (!) 52   Temp (!) 97.1 F (36.2 C) (Temporal)  Ht 5' (1.524 m)   Wt 208 lb 6.4 oz (94.5  kg)   BMI 40.70 kg/m  General:   Alert and oriented. No distress noted. Pleasant and cooperative.  Head:  Normocephalic and atraumatic. Eyes:  Conjuctiva clear without scleral icterus. Heart:  S1, S2 present without murmurs appreciated. Lungs:  Clear to auscultation bilaterally. No wheezes, rales, or rhonchi. No distress.  Abdomen:  +BS, soft, and non-distended. Mild TTP in RUQ, chronic. No rebound or guarding. No HSM or masses noted. Msk:  Symmetrical without gross deformities. Normal posture. Extremities:  Without edema. Neurologic:  Alert and  oriented x4 Psych:  Normal mood and affect.    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  NASH cirrhosis:  Remains well compensated.  MELD 8 in June 2022.  No history of esophageal varices, due for surveillance in April 2023.  She does have history of elevated AFP: 10.7 in February 2021 >> 3.30 June 2020>> 11.30 December 2020.  Prior MRI February 2021 with benign 5 mm hepatic cyst.  Due to mild increase in AFP in June, case was discussed with Dr. Gala Romney who recommended repeat AFP in 3 months (not completed), and next ultrasound should be replaced with MRI abdomen with liver protocol.  Unfortunately, due to multiple other appointments between herself, her husband, and upcoming surgeries for her husband, patient states she is unable to arrange any imaging at this time and needs to wait until January. We will go ahead and update AFP as previously recommended.   GERD:  Well-controlled on omeprazole 40 mg twice daily.  No alarm symptoms.  IDA:  Found to have IDA in 2019 s/p EGD and colonoscopy September 2019 which were normal.  Hemoglobin declined to 8.8, ferritin 05 May 2019.  She was started on omeprazole 40 mg daily due to dyspepsia, GERD, and  dysphagia which was later increased to twice daily February 2021.  EGD completed April 2021 with normal esophagus, abnormal appearing gastric mucosa s/p biopsy, abnormal duodenal mucosa s/p biopsy.  Duodenal biopsy benign.  Gastric biopsy with reactive gastropathy with erosions, H. pylori negative.  She continues to deny any overt GI bleeding and remains on PPI twice daily.  No NSAIDs.  Resumed iron daily in April 2022 and hemoglobin has been fluctuating between 11-12 range. Most recent Hg 11.0 with normocytic indices in August.   Etiology of IDA has not been identified.  It is possible that she had more extensive gastric erosions or PUD that was not appreciated on EGD as she had been on omeprazole twice daily 2 months prior to endoscopic evaluation.  Additionally, cannot rule out small bowel etiology. I have offered Givens capsule on multiple occasions including today to complete GI evaluation, but patient continues to decline.  We will plan to repeat CBC and iron panel at this time.  As long as labs remain stable, patient has requested to hold off on any further evaluation.   Plan:  CBC, iron panel, AFP. CBC, CMP, INR in December 2022. MRI abdomen with liver protocol January 2023. Continue omeprazole 40 mg twice daily. Continue iron daily. Requested patient to let us know if she develops bright red blood per rectum or black stools. Counseled on fatty liver with separate instructions provided. (See AVS). Avoid alcohol. 2000 mg/day Tylenol restriction. 2000 mg/day sodium restriction. Follow-up in 6 months or sooner if  needed.   Aliene Altes, PA-C [facility] 04/29/2021

## 2021-04-29 ENCOUNTER — Other Ambulatory Visit: Payer: Self-pay

## 2021-04-29 ENCOUNTER — Encounter: Payer: Self-pay | Admitting: Gastroenterology

## 2021-04-29 ENCOUNTER — Ambulatory Visit: Payer: Medicare Other | Admitting: Gastroenterology

## 2021-04-29 ENCOUNTER — Other Ambulatory Visit: Payer: Self-pay | Admitting: Gastroenterology

## 2021-04-29 VITALS — BP 159/88 | HR 52 | Temp 97.1°F | Ht 60.0 in | Wt 208.4 lb

## 2021-04-29 DIAGNOSIS — D509 Iron deficiency anemia, unspecified: Secondary | ICD-10-CM | POA: Diagnosis not present

## 2021-04-29 DIAGNOSIS — R772 Abnormality of alphafetoprotein: Secondary | ICD-10-CM | POA: Diagnosis not present

## 2021-04-29 DIAGNOSIS — K7460 Unspecified cirrhosis of liver: Secondary | ICD-10-CM

## 2021-04-29 DIAGNOSIS — K219 Gastro-esophageal reflux disease without esophagitis: Secondary | ICD-10-CM

## 2021-04-29 DIAGNOSIS — Z8719 Personal history of other diseases of the digestive system: Secondary | ICD-10-CM

## 2021-04-29 NOTE — Patient Instructions (Signed)
Please have blood work completed at [facility].  We will try to arrange the MRI for you the first week of January as you requested.  You will be due for routine blood work in December.  We will arrange this for you.  Continue taking iron daily.  Let us know if you develop any bright red blood per rectum or black stools.  Continue taking omeprazole 40 mg twice daily for GERD.  For cirrhosis: Recommend 1-2# weight loss per week until ideal body weight through exercise & diet. Low fat/cholesterol diet.   Avoid sweets, sodas, fruit juices, sweetened beverages like tea, etc. Gradually increase exercise from 15 min daily up to 1 hr per day 5 days/week. Continue to avoid alcohol. Limit Tylenol.  No more than 2000 mg/day. Low-sodium diet.  No more than 2000 mg/day.   It was a pleasure seeing you again today!  Good luck with everything coming up in the next few months!  We will plan to see back in the office in 6 months.  Do not hesitate to call if you have any questions or concerns prior to her next visit.   Aliene Altes, PA-C [facility]

## 2021-04-30 LAB — IRON,TIBC AND FERRITIN PANEL
Ferritin: 23 ng/mL (ref 15–150)
Iron Saturation: 11 % — ABNORMAL LOW (ref 15–55)
Iron: 41 ug/dL (ref 27–139)
Total Iron Binding Capacity: 389 ug/dL (ref 250–450)
UIBC: 348 ug/dL (ref 118–369)

## 2021-04-30 LAB — CBC WITH DIFFERENTIAL/PLATELET
Basophils Absolute: 0 10*3/uL (ref 0.0–0.2)
Basos: 0 %
EOS (ABSOLUTE): 0 10*3/uL (ref 0.0–0.4)
Eos: 0 %
Hematocrit: 38.1 % (ref 34.0–46.6)
Hemoglobin: 12.2 g/dL (ref 11.1–15.9)
Immature Grans (Abs): 0 10*3/uL (ref 0.0–0.1)
Immature Granulocytes: 0 %
Lymphocytes Absolute: 1.4 10*3/uL (ref 0.7–3.1)
Lymphs: 24 %
MCH: 29 pg (ref 26.6–33.0)
MCHC: 32 g/dL (ref 31.5–35.7)
MCV: 91 fL (ref 79–97)
Monocytes Absolute: 0.4 10*3/uL (ref 0.1–0.9)
Monocytes: 7 %
Neutrophils Absolute: 4.2 10*3/uL (ref 1.4–7.0)
Neutrophils: 69 %
Platelets: 159 10*3/uL (ref 150–450)
RBC: 4.2 x10E6/uL (ref 3.77–5.28)
RDW: 14.5 % (ref 11.7–15.4)
WBC: 6.1 10*3/uL (ref 3.4–10.8)

## 2021-04-30 LAB — AFP TUMOR MARKER: AFP, Serum, Tumor Marker: 16.6 ng/mL — ABNORMAL HIGH (ref 0.0–9.2)

## 2021-05-04 ENCOUNTER — Telehealth: Payer: Self-pay | Admitting: *Deleted

## 2021-05-04 ENCOUNTER — Encounter: Payer: Self-pay | Admitting: *Deleted

## 2021-05-04 DIAGNOSIS — H353122 Nonexudative age-related macular degeneration, left eye, intermediate dry stage: Secondary | ICD-10-CM | POA: Diagnosis not present

## 2021-05-04 DIAGNOSIS — H353211 Exudative age-related macular degeneration, right eye, with active choroidal neovascularization: Secondary | ICD-10-CM | POA: Diagnosis not present

## 2021-05-04 DIAGNOSIS — Z8719 Personal history of other diseases of the digestive system: Secondary | ICD-10-CM

## 2021-05-04 DIAGNOSIS — R772 Abnormality of alphafetoprotein: Secondary | ICD-10-CM

## 2021-05-04 DIAGNOSIS — Z961 Presence of intraocular lens: Secondary | ICD-10-CM | POA: Diagnosis not present

## 2021-05-04 DIAGNOSIS — H35372 Puckering of macula, left eye: Secondary | ICD-10-CM | POA: Diagnosis not present

## 2021-05-04 DIAGNOSIS — E119 Type 2 diabetes mellitus without complications: Secondary | ICD-10-CM | POA: Diagnosis not present

## 2021-05-04 NOTE — Telephone Encounter (Signed)
Noted. Routing to [facility] Clinical Pool. Please arrange MRI abdomen with liver protocol ASAP. Dx: History of cirrhosis, elevated AFP

## 2021-05-04 NOTE — Telephone Encounter (Signed)
Spoke to pt. Informed her of results and recommendations. Pt. Informed me that she would have the MRI. If it could be scheduled in the next 2 weeks anytime

## 2021-05-05 NOTE — Addendum Note (Signed)
Addended by: Cheron Every on: 05/05/2021 07:56 AM   Modules accepted: Orders

## 2021-05-05 NOTE — Telephone Encounter (Signed)
MRI scheduled for 10/25 at 9:00am, arrival 8:30am, npo 4 hrs prior.  Called pt, left detailed message on named VM. Letter also mailed

## 2021-05-10 DIAGNOSIS — M109 Gout, unspecified: Secondary | ICD-10-CM | POA: Diagnosis not present

## 2021-05-10 DIAGNOSIS — J329 Chronic sinusitis, unspecified: Secondary | ICD-10-CM | POA: Diagnosis not present

## 2021-05-10 DIAGNOSIS — R809 Proteinuria, unspecified: Secondary | ICD-10-CM | POA: Diagnosis not present

## 2021-05-10 DIAGNOSIS — Z299 Encounter for prophylactic measures, unspecified: Secondary | ICD-10-CM | POA: Diagnosis not present

## 2021-05-10 DIAGNOSIS — E1129 Type 2 diabetes mellitus with other diabetic kidney complication: Secondary | ICD-10-CM | POA: Diagnosis not present

## 2021-05-18 ENCOUNTER — Other Ambulatory Visit: Payer: Self-pay

## 2021-05-18 ENCOUNTER — Ambulatory Visit (HOSPITAL_COMMUNITY)
Admission: RE | Admit: 2021-05-18 | Discharge: 2021-05-18 | Disposition: A | Discharge status: Home / Self Care | Payer: Medicare Other | Source: Ambulatory Visit | Attending: Gastroenterology | Admitting: Gastroenterology

## 2021-05-18 DIAGNOSIS — R772 Abnormality of alphafetoprotein: Secondary | ICD-10-CM | POA: Insufficient documentation

## 2021-05-18 DIAGNOSIS — D1800 Hemangioma unspecified site: Secondary | ICD-10-CM | POA: Diagnosis not present

## 2021-05-18 DIAGNOSIS — N281 Cyst of kidney, acquired: Secondary | ICD-10-CM | POA: Diagnosis not present

## 2021-05-18 DIAGNOSIS — Z8719 Personal history of other diseases of the digestive system: Secondary | ICD-10-CM | POA: Diagnosis not present

## 2021-05-18 DIAGNOSIS — K7460 Unspecified cirrhosis of liver: Secondary | ICD-10-CM | POA: Diagnosis not present

## 2021-05-18 DIAGNOSIS — K760 Fatty (change of) liver, not elsewhere classified: Secondary | ICD-10-CM | POA: Diagnosis not present

## 2021-05-18 IMAGING — MR MR ABDOMEN WO/W CM
20 series · 47 of 48 positions shown · IV contrast (10 ml Gadavist)
Comparison: Ultrasound [DATE] and MRI abdomen [DATE]

CLINICAL DATA: History of cirrhosis, elevated AFP. Upper abdominal
pain.

EXAM:
MRI ABDOMEN WITHOUT AND WITH CONTRAST
TECHNIQUE: Multiplanar multisequence MR imaging of the abdomen was performed
both before and after the administration of intravenous contrast.
CONTRAST:  10mL GADAVIST GADOBUTROL 1 MMOL/ML IV SOLN

[Series 3: cor haste · coronal · 6.0mm · 1.25mm/px · 1 of 40 slices shown]
[im 1/40]
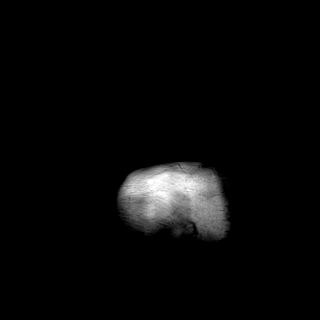

[Series 4: ax haste · axial · 6.0mm · 1.41mm/px · 1 of 30 slices shown]
[im 1/30]
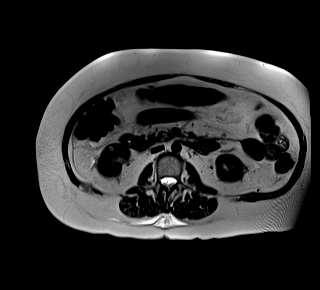

[Series 6: T2 fat-sat · axial · 6.0mm · 1.25mm/px · 1 of 30 slices shown]
[im 1/30]
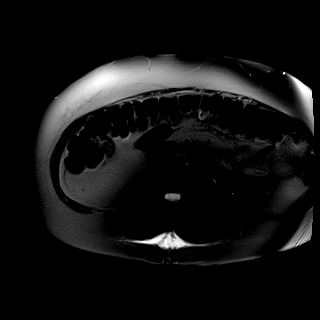

[Series 7: DWI · axial · 6.0mm · 1.68mm/px · 1 of 40 slices shown (1 of 4)]
[im 1/40]
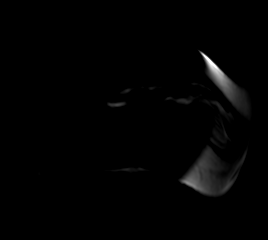

[Series 7: DWI · axial · 6.0mm · 1.68mm/px · 1 of 40 slices shown (2 of 4)]
[im 1/40]
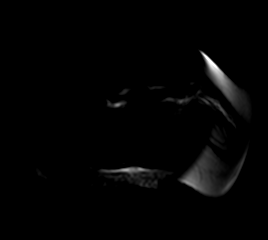

[Series 7: DWI · axial · 6.0mm · 1.68mm/px · z∈[-141,+139]mm · 2 of 40 slices shown (3 of 4)]
[im 1/40]
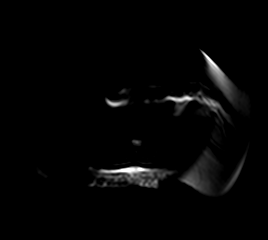
[im 40/40]
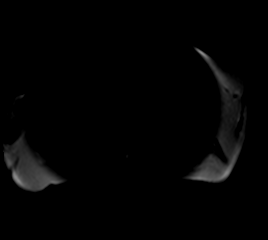

[Series 8: DWI · axial · 6.0mm · 1.68mm/px · z∈[-141,+139]mm · 2 of 40 slices shown (4 of 4)]
[im 1/40]
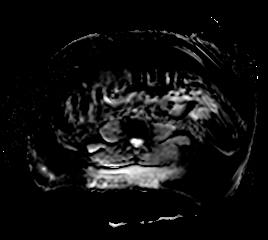
[im 40/40]
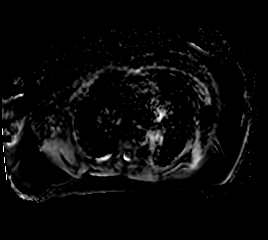

[Series 9: bSSFP · axial · 6.0mm · 0.88mm/px · z∈[-112,+110]mm · 2 of 38 slices shown]
[im 1/38]
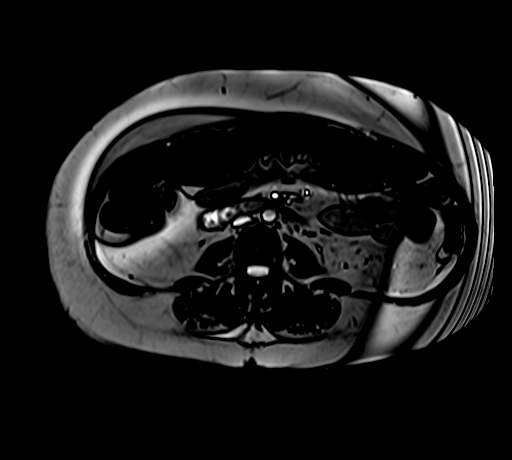
[im 38/38]
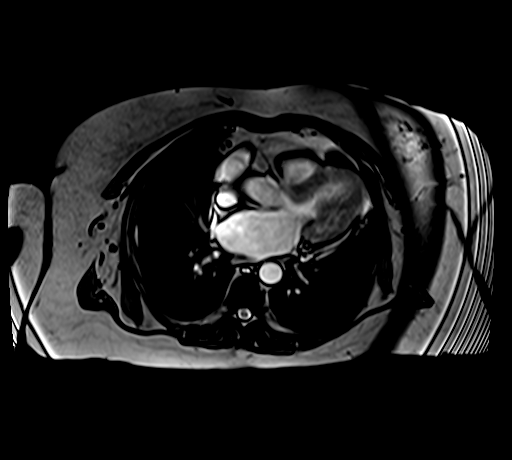

[Series 10: ax in and · axial · 3.0mm · 1.25mm/px · z∈[-115,+122]mm · 3 of 80 slices shown (1 of 2)]
[im 1/80]
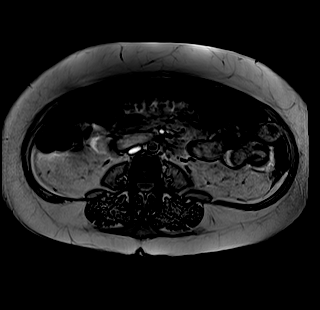
[im 40/80]
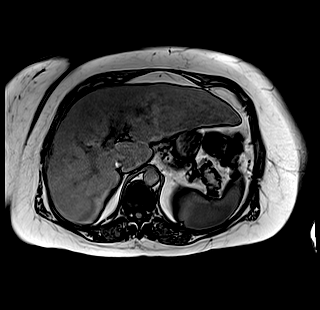
[im 80/80]
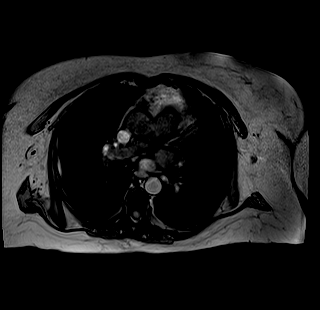

[Series 11: ax in and · axial · 3.0mm · 1.25mm/px · z∈[-115,+122]mm · 3 of 80 slices shown (2 of 2)]
[im 1/80]
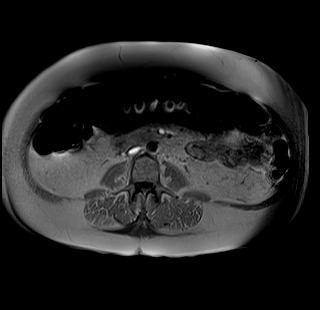
[im 40/80]
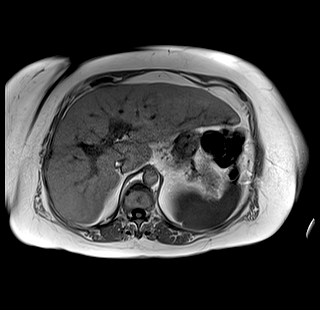
[im 80/80]
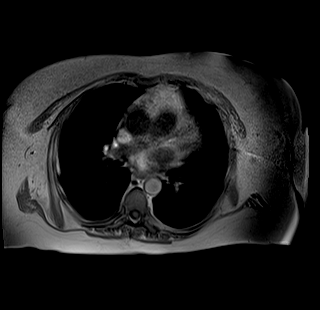

[Series 12: T1 dynamic · axial · non-contrast · 3.0mm · 1.25mm/px · z∈[-86,+127]mm · 3 of 72 slices shown (1 of 4)]
[im 1/72]
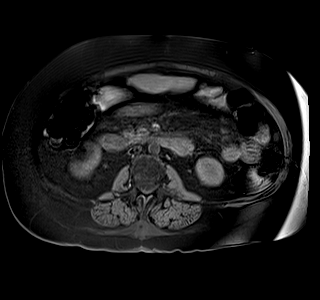
[im 36/72]
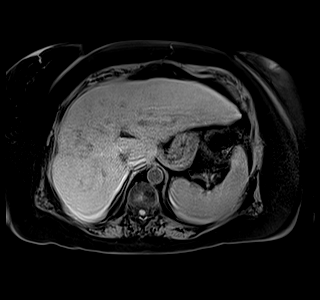
[im 72/72]
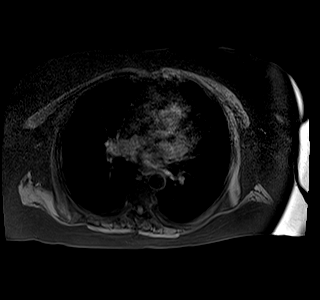

[Series 14: T1 dynamic post-contrast · axial · 3.0mm · 1.25mm/px · z∈[-86,+127]mm · 3 of 72 slices shown (1 of 6)]
[im 1/72]
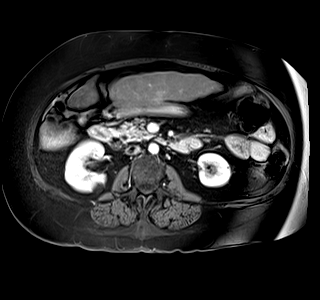
[im 36/72]
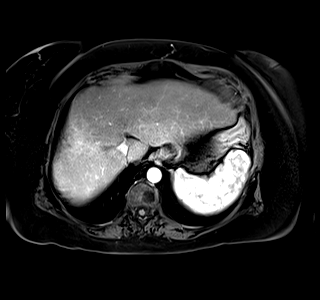
[im 72/72]
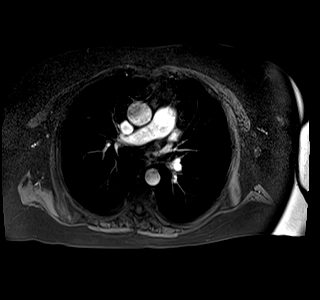

[Series 15: T1 dynamic · axial · 3.0mm · 1.25mm/px · z∈[-86,+127]mm · 3 of 72 slices shown (2 of 4)]
[im 1/72]
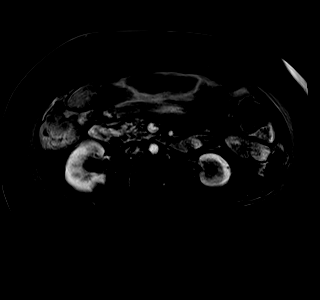
[im 36/72]
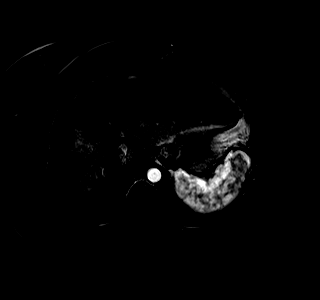
[im 72/72]
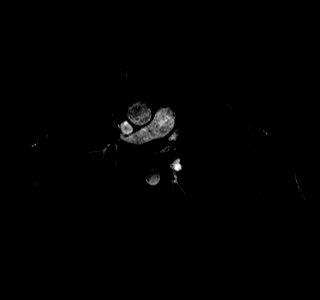

[Series 16: T1 dynamic post-contrast · axial · 3.0mm · 1.25mm/px · z∈[-86,+127]mm · 3 of 72 slices shown (2 of 6)]
[im 1/72]
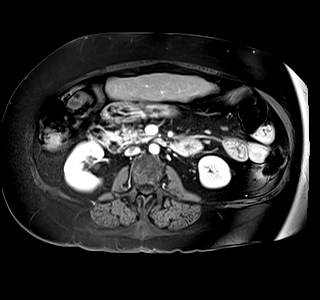
[im 36/72]
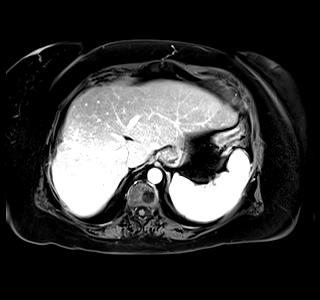
[im 72/72]
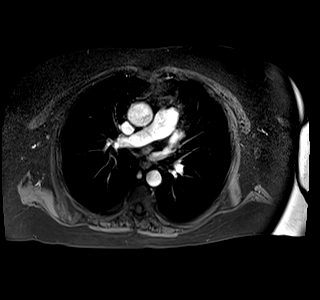

[Series 17: T1 dynamic · axial · 3.0mm · 1.25mm/px · z∈[-86,+127]mm · 3 of 72 slices shown (3 of 4)]
[im 1/72]
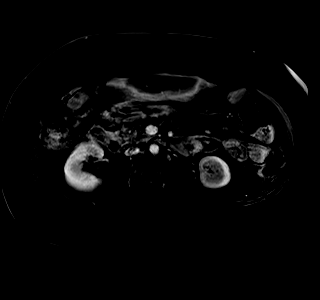
[im 36/72]
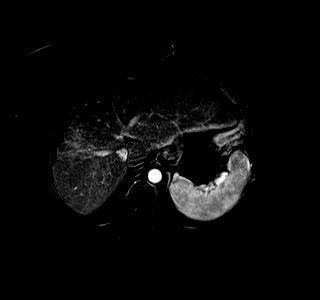
[im 72/72]
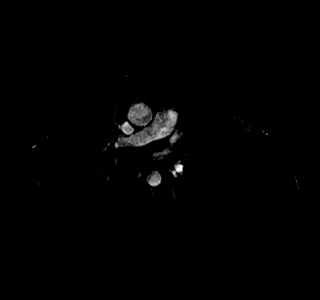

[Series 18: T1 dynamic post-contrast · axial · 3.0mm · 1.25mm/px · z∈[-86,+127]mm · 3 of 72 slices shown (3 of 6)]
[im 1/72]
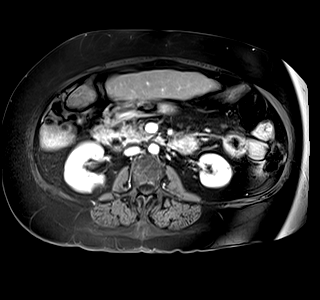
[im 36/72]
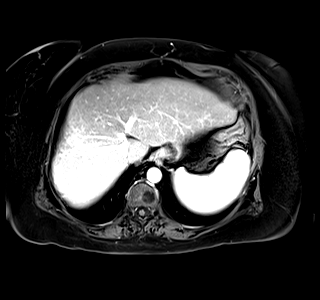
[im 72/72]
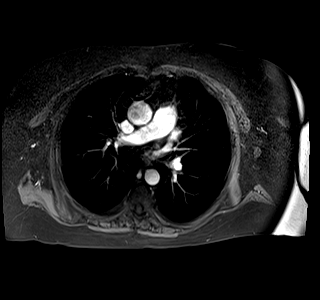

[Series 19: T1 dynamic · axial · 3.0mm · 1.25mm/px · z∈[-86,+127]mm · 3 of 72 slices shown (4 of 4)]
[im 1/72]
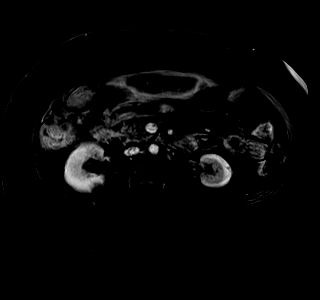
[im 36/72]
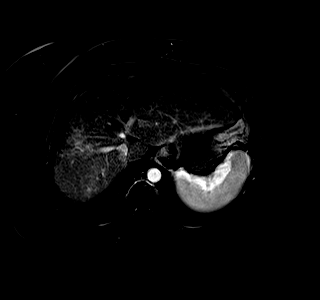
[im 72/72]
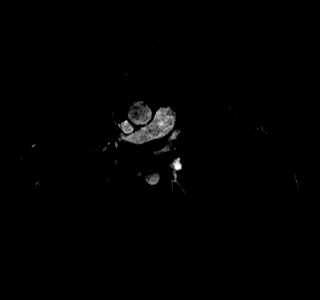

[Series 20: T1 dynamic post-contrast · coronal · 3.0mm · 1.31mm/px · 4 of 88 slices shown (4 of 6)]
[im 1/88]
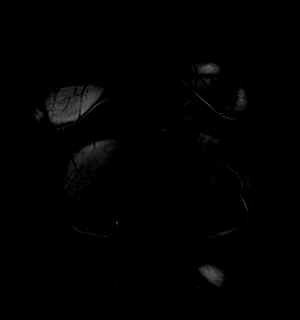
[im 30/88]
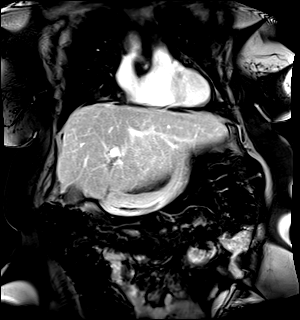
[im 59/88]
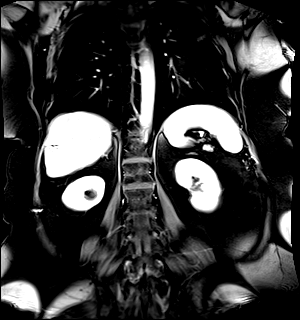
[im 88/88]
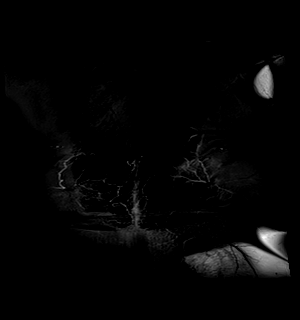

[Series 21: T1 dynamic post-contrast · axial · 3.0mm · 1.25mm/px · z∈[-86,+127]mm · 3 of 72 slices shown (5 of 6)]
[im 1/72]
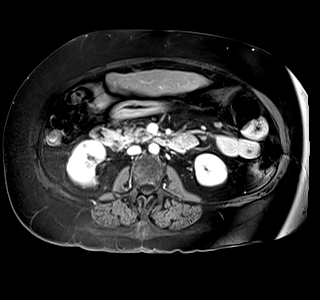
[im 36/72]
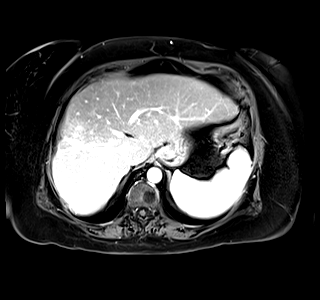
[im 72/72]
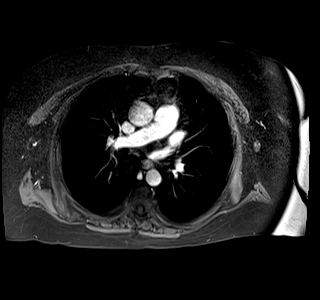

[Series 22: T1 dynamic post-contrast · axial · 3.0mm · 1.25mm/px · z∈[-86,+19]mm · 2 of 72 slices shown (6 of 6)]
[im 1/72]
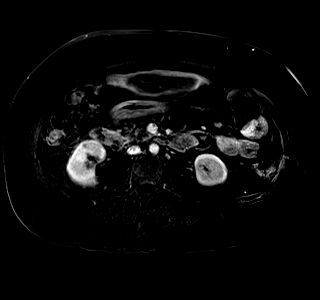
[im 36/72]
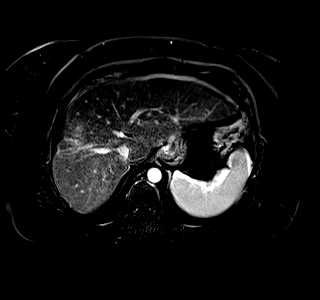

[47 of 48 positions shown; findings below may reference images not displayed]

FINDINGS: Lower chest: Four-chamber cardiac enlargement.

Hepatobiliary: Diffuse hepatic steatosis. Nodular hepatic contour.
Peripheral areas of reticular enhancement some of which demonstrate
increased T2 signal involving the lateral right lobe of the liver,
caudate and posterior left lobe of the liver for instance on image
44/14, consistent with fibrosis. Cyst in the left lobe of the liver.
No arterially enhancing hepatic lesion.

Gallbladder is surgically absent. Prominence of the intra and
extrahepatic biliary tree similar prior and favor reservoir effect
post cholecystectomy.

Pancreas: Intrinsic T1 signal of the pancreatic parenchyma is within
normal limits. Normal pancreatic enhancement. No pancreatic ductal
dilation. No cystic or arterially enhancing solid pancreatic lesion
visualized.

Spleen:  No splenomegaly.

Adrenals/Urinary Tract: Bilateral adrenal glands are unremarkable.
Right upper pole renal cyst. No solid enhancing lesion in the
visualized portions of the kidneys. No hydronephrosis.

Stomach/Bowel: Visualized portions within the abdomen are
unremarkable.

Vascular/Lymphatic: The portal and splenic veins are patent. No
abdominal aortic aneurysm. No pathologically enlarged abdominal
lymph nodes.

Other:  No abdominal ascites.

Musculoskeletal: T11 vertebral body hemangioma. No suspicious
osseous lesion.
IMPRESSION: Diffuse hepatic steatosis with morphologic changes of cirrhosis. No
suspicious hepatic lesion.

## 2021-05-18 MED ORDER — GADOBUTROL 1 MMOL/ML IV SOLN
10.0000 mL | Freq: Once | INTRAVENOUS | Status: AC | PRN
  Administered 2021-05-18: 10 mL via INTRAVENOUS

## 2021-05-24 DIAGNOSIS — E1165 Type 2 diabetes mellitus with hyperglycemia: Secondary | ICD-10-CM | POA: Diagnosis not present

## 2021-05-26 DIAGNOSIS — Z299 Encounter for prophylactic measures, unspecified: Secondary | ICD-10-CM | POA: Diagnosis not present

## 2021-05-26 DIAGNOSIS — I10 Essential (primary) hypertension: Secondary | ICD-10-CM | POA: Diagnosis not present

## 2021-05-26 DIAGNOSIS — K7460 Unspecified cirrhosis of liver: Secondary | ICD-10-CM | POA: Diagnosis not present

## 2021-05-26 DIAGNOSIS — E1165 Type 2 diabetes mellitus with hyperglycemia: Secondary | ICD-10-CM | POA: Diagnosis not present

## 2021-05-26 DIAGNOSIS — L4050 Arthropathic psoriasis, unspecified: Secondary | ICD-10-CM | POA: Diagnosis not present

## 2021-05-28 ENCOUNTER — Other Ambulatory Visit: Payer: Self-pay

## 2021-05-28 DIAGNOSIS — K7460 Unspecified cirrhosis of liver: Secondary | ICD-10-CM

## 2021-06-08 ENCOUNTER — Encounter: Payer: Self-pay | Admitting: Infectious Disease

## 2021-06-08 ENCOUNTER — Ambulatory Visit: Payer: Medicare Other | Admitting: Infectious Disease

## 2021-06-08 ENCOUNTER — Other Ambulatory Visit: Payer: Self-pay

## 2021-06-08 ENCOUNTER — Ambulatory Visit (INDEPENDENT_AMBULATORY_CARE_PROVIDER_SITE_OTHER): Payer: Medicare Other

## 2021-06-08 VITALS — BP 164/88 | HR 66 | Temp 97.6°F | Wt 204.2 lb

## 2021-06-08 DIAGNOSIS — Z7185 Encounter for immunization safety counseling: Secondary | ICD-10-CM

## 2021-06-08 DIAGNOSIS — H353113 Nonexudative age-related macular degeneration, right eye, advanced atrophic without subfoveal involvement: Secondary | ICD-10-CM

## 2021-06-08 DIAGNOSIS — Z23 Encounter for immunization: Secondary | ICD-10-CM | POA: Diagnosis not present

## 2021-06-08 DIAGNOSIS — R772 Abnormality of alphafetoprotein: Secondary | ICD-10-CM

## 2021-06-08 DIAGNOSIS — L409 Psoriasis, unspecified: Secondary | ICD-10-CM | POA: Diagnosis not present

## 2021-06-08 DIAGNOSIS — K7460 Unspecified cirrhosis of liver: Secondary | ICD-10-CM

## 2021-06-08 DIAGNOSIS — Z227 Latent tuberculosis: Secondary | ICD-10-CM

## 2021-06-08 LAB — COMPLETE METABOLIC PANEL WITH GFR
AG Ratio: 1.5 (calc) (ref 1.0–2.5)
ALT: 48 U/L — ABNORMAL HIGH (ref 6–29)
AST: 45 U/L — ABNORMAL HIGH (ref 10–35)
Albumin: 4.1 g/dL (ref 3.6–5.1)
Alkaline phosphatase (APISO): 93 U/L (ref 37–153)
BUN: 14 mg/dL (ref 7–25)
CO2: 28 mmol/L (ref 20–32)
Calcium: 9.4 mg/dL (ref 8.6–10.4)
Chloride: 102 mmol/L (ref 98–110)
Creat: 0.95 mg/dL (ref 0.50–1.05)
Globulin: 2.7 g/dL (calc) (ref 1.9–3.7)
Glucose, Bld: 198 mg/dL — ABNORMAL HIGH (ref 65–99)
Potassium: 4.9 mmol/L (ref 3.5–5.3)
Sodium: 138 mmol/L (ref 135–146)
Total Bilirubin: 0.3 mg/dL (ref 0.2–1.2)
Total Protein: 6.8 g/dL (ref 6.1–8.1)
eGFR: 66 mL/min/{1.73_m2} (ref >=60)

## 2021-06-08 NOTE — Progress Notes (Signed)
   Covid-19 Vaccination Clinic  Name:  Kristine Rocha    MRN: [id number] DOB: 1956-02-19  06/08/2021  [name] was observed post Covid-19 immunization for 15 minutes without incident. She was provided with Vaccine Information Sheet and instruction to access the V-Safe system.   [name] was instructed to call 911 with any severe reactions post vaccine: Difficulty breathing  Swelling of face and throat  A fast heartbeat  A bad rash all over body  Dizziness and weakness   Immunizations Administered     Name Date Dose VIS Date Route   Pfizer Covid-19 Vaccine Bivalent Booster 06/08/2021  4:28 PM 0.3 mL 03/24/2021 Intramuscular   Manufacturer: [place]   Lot: [id number]   [place]: Stem [place]

## 2021-06-08 NOTE — Progress Notes (Signed)
Subjective:   Chief complaint: Follow-up for latent tuberculosis   Patient ID: Kristine Rocha, female    DOB: 1956/07/13, 65 y.o.   MRN: [id number]  HPI  Kristine Rocha is a very pleasant 65 year old Caucasian lady with a history of psoriasis and psoriatic arthritis who developed hepatotoxicity in the past on methotrexate with cirrhosis of the liver ensuing.  She has history of an uncle on her father side having pulmonary tuberculosis in the 73s.  She was exposed to him at that time and later in the 1980s had a positive skin test x2 to tuberculosis.  She was never treated for latent tuberculosis.  She follows with Dr. Lenna Gilford for her psoriasis and psoriatic arthritis.   Patient is currently not on any active drugs for psoriasis.  Ideally her rheumatologist would like to be able to use more potent immunosuppressive therapy including biologic therapy possibly.  However certainly I agree that her latent TB needs to be treated first.  She has had a chest x-ray that is without evidence of cardiopulmonary disease and she does not have any symptoms to suggest active tuberculosis.  We considered different options and she would prefer to go with isoniazid to minimize risk of hepatotoxicity given her pre-existing cirrhosis of the liver that developed while she was getting methotrexate therapy.  She has been tolerating the [place] and taking this religiously with pyridoxine. No side effects noted.  She did have some mild elevation in transaminases when we last checked her CMP she does have comorbid NASH.  She recently had an elevation in her alpha-fetoprotein and had an MRI of the liver which showed no evidence of hepatocellular carcinoma but a cyst and known cirrhosis.  She is eager to get back on some Biologics to treat her psoriasis in particular which has been difficult to manage.     Past Medical History:  Diagnosis Date   Asthma    Cirrhosis ([place]) 06/2019   Immune to Hep A. Has completed Hep B vaccine  (May 2021)   Diabetes [facility])    Essential hypertension    GERD (gastroesophageal reflux disease)    History of GI bleed    Hypothyroidism    Macular degeneration    wet in right, dry in left, has injections for this.   Microcytic anemia    Palpitations    Peptic ulcer disease    Postural orthostatic tachycardia syndrome    Mildly orthostatic results tilt table test 2005  - Dr. Caryl Comes   Psoriasis    Rheumatoid arthritis ([place])    TB lung, latent 02/01/2021    Past Surgical History:  Procedure Laterality Date   ABDOMINAL HERNIA REPAIR  15 years ago   umbilical with mesh   ABDOMINAL HYSTERECTOMY  20 years ago    BIOPSY  11/04/2019   Procedure: BIOPSY;  Surgeon: Daneil Dolin, MD;  [facility];  Service: Endoscopy;;  duodenum gastric   CATARACT EXTRACTION, BILATERAL Bilateral    CHOLECYSTECTOMY     COLONOSCOPY WITH ESOPHAGOGASTRODUODENOSCOPY (EGD)  03/2018   [facility]; indication:IDA; Normal exam.    ESOPHAGOGASTRODUODENOSCOPY (EGD) WITH PROPOFOL N/A 11/04/2019   Procedure: ESOPHAGOGASTRODUODENOSCOPY (EGD) WITH PROPOFOL;  Surgeon: Daneil Dolin, MD; normal esophagus s/p empiric dilation, abnormal appearing gastric mucosa s/p biopsy, abnormal duodenal mucosa s/p biopsied, no ulcer, infiltrating process, or portal gastropathy.  Duodenal biopsies benign.  Gastric biopsy with reactive gastropathy with erosions, no H. pylori.    MALONEY DILATION N/A 11/04/2019   Procedure: MALONEY DILATION;  Surgeon: Daneil Dolin, MD;  [facility];  Service: Endoscopy;  Laterality: N/A;   TILT TABLE STUDY  2005   [facility]   TRIGGER FINGER RELEASE Right    2nd finger    Family History  Problem Relation Age of Onset   Heart attack Father        Age 39   Arrhythmia Sister        WPW s/p ablation- corrected at [facility]   Alzheimer's disease Mother    Pneumonia Mother        Aspiration   Heart attack Brother    Heart attack Maternal Grandfather    Colon cancer Maternal  Aunt        diagnosed at 10   Breast cancer Maternal Grandmother       Social History   Socioeconomic History   Marital status: Married    Spouse name: Not on file   Number of children: Not on file   Years of education: Not on file   Highest education level: Not on file  Occupational History   Not on file  Tobacco Use   Smoking status: Never   Smokeless tobacco: Never  Vaping Use   Vaping Use: Never used  Substance and Sexual Activity   Alcohol use: No    Alcohol/week: 0.0 standard drinks   Drug use: No   Sexual activity: Yes  Other Topics Concern   Not on file  Social History Narrative   Not on file   Social Determinants of Health   Financial Resource Strain: Not on file  Food Insecurity: Not on file  Transportation Needs: Not on file  Physical Activity: Not on file  Stress: Not on file  Social Connections: Not on file    Allergies  Allergen Reactions   Amoxicillin Hives, Shortness Of Breath and Rash    Did it involve swelling of the face/tongue/throat, SOB, or low BP? Yes Did it involve sudden or severe rash/hives, skin peeling, or any reaction on the inside of your mouth or nose? Unknown Did you need to seek medical attention at a hospital or doctor's office? Yes When did it last happen?~15-20 years ago       If all above answers are "NO", may proceed with cephalosporin use. .    Ampicillin Hives, Shortness Of Breath and Rash    Did it involve swelling of the face/tongue/throat, SOB, or low BP? Yes Did it involve sudden or severe rash/hives, skin peeling, or any reaction on the inside of your mouth or nose? Unknown Did you need to seek medical attention at a hospital or doctor's office? Yes When did it last happen?~15-20 years ago       If all above answers are "NO", may proceed with cephalosporin use. .   Methotrexate Derivatives Other (See Comments)    hepatoxicity   Dust Mite Extract    Molds & Smuts    Other     NON-STEROIDAL ANTI-INFLAMMATORY  DRUG CAUSED GASTRIC BLEED. Trees-especially pines.   Prevacid [Lansoprazole] Diarrhea     Current Outpatient Medications:    atorvastatin (LIPITOR) 10 MG tablet, Take 10 mg by mouth every evening. , Disp: , Rfl:    cetirizine (ZYRTEC) 10 MG tablet, Take 10 mg by mouth daily., Disp: , Rfl:    Cyanocobalamin (VITAMIN B-12 PO), Take by mouth daily., Disp: , Rfl:    diphenhydrAMINE (BENADRYL) 25 MG tablet, Take 25 mg by mouth at bedtime. As needed, Disp: , Rfl:  Ferrous Sulfate (IRON) 325 (65 Fe) MG TABS, Take 1 tablet by mouth daily., Disp: , Rfl:    glipiZIDE (GLUCOTROL XL) 5 MG 24 hr tablet, Take 10 mg by mouth daily., Disp: , Rfl:    hyoscyamine (LEVSIN SL) 0.125 MG SL tablet, Place under the tongue 3 (three) times daily as needed., Disp: , Rfl:    isoniazid (NYDRAZID) 300 MG tablet, Take 1 tablet (300 mg total) by mouth daily., Disp: 30 tablet, Rfl: 8   levothyroxine (SYNTHROID) 88 MCG tablet, Take 88 mcg by mouth daily., Disp: , Rfl:    Melatonin 10 MG TABS, Take 10 mg by mouth at bedtime. , Disp: , Rfl:    metFORMIN (GLUCOPHAGE-XR) 500 MG 24 hr tablet, Take 500 mg by mouth 2 (two) times daily., Disp: , Rfl: 0   metoprolol succinate (TOPROL-XL) 100 MG 24 hr tablet, Take 100 mg by mouth daily. *Hold for low blood pressure*, Disp: , Rfl:    Multiple Vitamins-Minerals (PRESERVISION AREDS 2 PO), Take 1 tablet by mouth 2 (two) times daily., Disp: , Rfl:    Multiple Vitamins-Minerals (ZINC PO), Take by mouth daily., Disp: , Rfl:    omeprazole (PRILOSEC) 40 MG capsule, TAKE 1 CAPSULE BY MOUTH  TWICE DAILY BEFORE A MEAL, Disp: 180 capsule, Rfl: 2   ONETOUCH VERIO test strip, , Disp: , Rfl: 0   OVER THE COUNTER MEDICATION, OTC Iron daily, Disp: , Rfl:    pyridOXINE (VITAMIN B-6) 50 MG tablet, Take 1 tablet (50 mg total) by mouth daily., Disp: 30 tablet, Rfl: 8   tiZANidine (ZANAFLEX) 4 MG tablet, Take 4 mg by mouth 3 (three) times daily as needed for muscle spasms. , Disp: , Rfl:    Vitamin D,  Ergocalciferol, (DRISDOL) 1.25 MG (50000 UNIT) CAPS capsule, Take 50,000 Units by mouth every 7 (seven) days., Disp: , Rfl:    VITAMIN E PO, Take 1 capsule by mouth every evening., Disp: , Rfl:    Review of Systems  Constitutional:  Negative for chills and fever.  HENT:  Negative for congestion and sore throat.   Eyes:  Negative for photophobia.  Respiratory:  Negative for cough, shortness of breath and wheezing.   Cardiovascular:  Negative for chest pain, palpitations and leg swelling.  Gastrointestinal:  Negative for abdominal pain, blood in stool, constipation, diarrhea, nausea and vomiting.  Genitourinary:  Negative for dysuria, flank pain and hematuria.  Musculoskeletal:  Negative for back pain and myalgias.  Skin:  Positive for rash.  Neurological:  Negative for dizziness, weakness and headaches.  Hematological:  Does not bruise/bleed easily.  Psychiatric/Behavioral:  Negative for suicidal ideas.       Objective:   Physical Exam Constitutional:      General: She is not in acute distress.    Appearance: She is not diaphoretic.  HENT:     Head: Normocephalic and atraumatic.     Right Ear: External ear normal.     Left Ear: External ear normal.     Nose: Nose normal.     Mouth/Throat:     Pharynx: No oropharyngeal exudate.  Eyes:     General: No scleral icterus.       Right eye: No discharge.        Left eye: No discharge.     Extraocular Movements: Extraocular movements intact.     Conjunctiva/sclera: Conjunctivae normal.  Cardiovascular:     Rate and Rhythm: Normal rate and regular rhythm.     Heart sounds:  No friction rub.  Pulmonary:     Effort: Pulmonary effort is normal. No respiratory distress.     Breath sounds: No wheezing or rales.  Abdominal:     General: There is no distension.     Palpations: Abdomen is soft.     Tenderness: There is no rebound.  Musculoskeletal:        General: No tenderness. Normal range of motion.     Cervical back: Normal range  of motion and neck supple.  Lymphadenopathy:     Cervical: No cervical adenopathy.  Skin:    General: Skin is warm and dry.     Coloration: Skin is not jaundiced or pale.     Findings: Rash present. No erythema or lesion.  Neurological:     General: No focal deficit present.     Mental Status: She is alert and oriented to person, place, and time.     Coordination: Coordination normal.  Psychiatric:        Mood and Affect: Mood normal.        Behavior: Behavior normal.        Thought Content: Thought content normal.        Judgment: Judgment normal.          Assessment & Plan:   Latent tuberculosis:  She can continue on isoniazid with B6.  We will get her through an 50-month course.  I am checking repeat CMP  NASH with cirrhosis: Again is important to monitor her liver function tests on treatment.  Psoriasis and psoriatic arthritis: She is going to be seeing Dr. Lenna Gilford who may be starting Biologics sometime in the near future.  The patient is now on her third month of treatment for latent tuberculosis.  Vaccine counseling I recommended that she get updated COVID-19 booster which we gave today.

## 2021-06-21 ENCOUNTER — Encounter (INDEPENDENT_AMBULATORY_CARE_PROVIDER_SITE_OTHER): Payer: Self-pay | Admitting: Ophthalmology

## 2021-06-21 ENCOUNTER — Encounter (INDEPENDENT_AMBULATORY_CARE_PROVIDER_SITE_OTHER): Payer: Medicare Other | Admitting: Ophthalmology

## 2021-06-21 ENCOUNTER — Other Ambulatory Visit: Payer: Self-pay

## 2021-06-21 ENCOUNTER — Ambulatory Visit (INDEPENDENT_AMBULATORY_CARE_PROVIDER_SITE_OTHER): Payer: Medicare Other | Admitting: Ophthalmology

## 2021-06-21 DIAGNOSIS — H353122 Nonexudative age-related macular degeneration, left eye, intermediate dry stage: Secondary | ICD-10-CM

## 2021-06-21 DIAGNOSIS — H35372 Puckering of macula, left eye: Secondary | ICD-10-CM | POA: Diagnosis not present

## 2021-06-21 DIAGNOSIS — H353211 Exudative age-related macular degeneration, right eye, with active choroidal neovascularization: Secondary | ICD-10-CM | POA: Diagnosis not present

## 2021-06-21 MED ORDER — BEVACIZUMAB 2.5 MG/0.1ML IZ SOSY
2.5000 mg | PREFILLED_SYRINGE | INTRAVITREAL | Status: AC | PRN
  Administered 2021-06-21: 11:00:00 2.5 mg via INTRAVITREAL

## 2021-06-21 NOTE — Progress Notes (Signed)
06/21/2021     CHIEF COMPLAINT Patient presents for  Chief Complaint  Patient presents with   Retina Follow Up      HISTORY OF PRESENT ILLNESS: Kristine Rocha is a 65 y.o. female who presents to the clinic today for:   HPI     Retina Follow Up   Patient presents with  Wet AMD.  In both eyes.  This started 3 months ago.  Duration of 3 months.        Comments   3 month f/u OU with OCT and possible Avastin injection OD  Pt c/o new FOL inferior in the right eye, started ~ 1 week ago.  Near the center, the bottom of the center vision      Last edited by Hurman Horn, MD on 06/21/2021 10:50 AM.      Referring physician: Glenda Chroman, MD [place],  [place] [address]  HISTORICAL INFORMATION:   Selected notes from the [facility]: No current outpatient medications on file. (Ophthalmic Drugs)   No current facility-administered medications for this visit. (Ophthalmic Drugs)   Current Outpatient Medications (Other)  Medication Sig   atorvastatin (LIPITOR) 10 MG tablet Take 10 mg by mouth every evening.    cetirizine (ZYRTEC) 10 MG tablet Take 10 mg by mouth daily.   Cyanocobalamin (VITAMIN B-12 PO) Take by mouth daily.   diphenhydrAMINE (BENADRYL) 25 MG tablet Take 25 mg by mouth at bedtime. As needed   Ferrous Sulfate (IRON) 325 (65 Fe) MG TABS Take 1 tablet by mouth daily.   glipiZIDE (GLUCOTROL XL) 5 MG 24 hr tablet Take 10 mg by mouth daily.   hyoscyamine (LEVSIN SL) 0.125 MG SL tablet Place under the tongue 3 (three) times daily as needed.   isoniazid (NYDRAZID) 300 MG tablet Take 1 tablet (300 mg total) by mouth daily.   levothyroxine (SYNTHROID) 88 MCG tablet Take 88 mcg by mouth daily.   Melatonin 10 MG TABS Take 10 mg by mouth at bedtime.    metFORMIN (GLUCOPHAGE-XR) 500 MG 24 hr tablet Take 500 mg by mouth 2 (two) times daily.   metoprolol succinate (TOPROL-XL) 100 MG 24 hr tablet Take 100 mg by mouth daily. *Hold  for low blood pressure*   Multiple Vitamins-Minerals (PRESERVISION AREDS 2 PO) Take 1 tablet by mouth 2 (two) times daily.   Multiple Vitamins-Minerals (ZINC PO) Take by mouth daily.   omeprazole (PRILOSEC) 40 MG capsule TAKE 1 CAPSULE BY MOUTH  TWICE DAILY BEFORE A MEAL   ONETOUCH VERIO test strip    OVER THE COUNTER MEDICATION OTC Iron daily   pyridOXINE (VITAMIN B-6) 50 MG tablet Take 1 tablet (50 mg total) by mouth daily.   tiZANidine (ZANAFLEX) 4 MG tablet Take 4 mg by mouth 3 (three) times daily as needed for muscle spasms.    Vitamin D, Ergocalciferol, (DRISDOL) 1.25 MG (50000 UNIT) CAPS capsule Take 50,000 Units by mouth every 7 (seven) days.   VITAMIN E PO Take 1 capsule by mouth every evening.   No current facility-administered medications for this visit. (Other)      REVIEW OF SYSTEMS:    ALLERGIES Allergies  Allergen Reactions   Amoxicillin Hives, Shortness Of Breath and Rash    Did it involve swelling of the face/tongue/throat, SOB, or low BP? Yes Did it involve sudden or severe rash/hives, skin peeling, or any reaction on the inside of your mouth or nose?  Unknown Did you need to seek medical attention at a hospital or doctor's office? Yes When did it last happen?~15-20 years ago       If all above answers are "NO", may proceed with cephalosporin use. .    Ampicillin Hives, Shortness Of Breath and Rash    Did it involve swelling of the face/tongue/throat, SOB, or low BP? Yes Did it involve sudden or severe rash/hives, skin peeling, or any reaction on the inside of your mouth or nose? Unknown Did you need to seek medical attention at a hospital or doctor's office? Yes When did it last happen?~15-20 years ago       If all above answers are "NO", may proceed with cephalosporin use. .   Methotrexate Derivatives Other (See Comments)    hepatoxicity   Dust Mite Extract    Molds & Smuts    Other     NON-STEROIDAL ANTI-INFLAMMATORY DRUG CAUSED GASTRIC  BLEED. Trees-especially pines.   Prevacid [Lansoprazole] Diarrhea    PAST MEDICAL HISTORY Past Medical History:  Diagnosis Date   Asthma    Cirrhosis ([place]) 06/2019   Immune to Hep A. Has completed Hep B vaccine (May 2021)   Diabetes [facility])    Essential hypertension    GERD (gastroesophageal reflux disease)    History of GI bleed    Hypothyroidism    Macular degeneration    wet in right, dry in left, has injections for this.   Microcytic anemia    Palpitations    Peptic ulcer disease    Postural orthostatic tachycardia syndrome    Mildly orthostatic results tilt table test 2005  - Dr. Caryl Comes   Psoriasis    Rheumatoid arthritis ([place])    TB lung, latent 02/01/2021   Past Surgical History:  Procedure Laterality Date   ABDOMINAL HERNIA REPAIR  15 years ago   umbilical with mesh   ABDOMINAL HYSTERECTOMY  20 years ago    BIOPSY  11/04/2019   Procedure: BIOPSY;  Surgeon: Daneil Dolin, MD;  [facility];  Service: Endoscopy;;  duodenum gastric   CATARACT EXTRACTION, BILATERAL Bilateral    CHOLECYSTECTOMY     COLONOSCOPY WITH ESOPHAGOGASTRODUODENOSCOPY (EGD)  03/2018   [facility]; indication:IDA; Normal exam.    ESOPHAGOGASTRODUODENOSCOPY (EGD) WITH PROPOFOL N/A 11/04/2019   Procedure: ESOPHAGOGASTRODUODENOSCOPY (EGD) WITH PROPOFOL;  Surgeon: Daneil Dolin, MD; normal esophagus s/p empiric dilation, abnormal appearing gastric mucosa s/p biopsy, abnormal duodenal mucosa s/p biopsied, no ulcer, infiltrating process, or portal gastropathy.  Duodenal biopsies benign.  Gastric biopsy with reactive gastropathy with erosions, no H. pylori.    MALONEY DILATION N/A 11/04/2019   Procedure: Venia Minks DILATION;  Surgeon: Daneil Dolin, MD;  [facility];  Service: Endoscopy;  Laterality: N/A;   TILT TABLE STUDY  2005   [facility]   TRIGGER FINGER RELEASE Right    2nd finger    FAMILY HISTORY Family History  Problem Relation Age of Onset   Heart attack Father         Age 72   Arrhythmia Sister        WPW s/p ablation- corrected at [facility]   Alzheimer's disease Mother    Pneumonia Mother        Aspiration   Heart attack Brother    Heart attack Maternal Grandfather    Colon cancer Maternal Aunt        diagnosed at 53   Breast cancer Maternal Grandmother     SOCIAL HISTORY Social History  Tobacco Use   Smoking status: Never   Smokeless tobacco: Never  Vaping Use   Vaping Use: Never used  Substance Use Topics   Alcohol use: No    Alcohol/week: 0.0 standard drinks   Drug use: No         OPHTHALMIC EXAM:  Base Eye Exam     Visual Acuity (ETDRS)       Right Left   Dist [place] 20/80 +2 20/20   Dist ph [place] NI          Tonometry (Tonopen, 10:04 AM)       Right Left   Pressure 12 12         Pupils       Pupils Dark Light Shape React APD   Right PERRL 3 2 Round Brisk None   Left PERRL 3 2 Round Brisk None         Visual Fields (Counting fingers)       Left Right    Full Full         Extraocular Movement       Right Left    Full, Ortho Full, Ortho         Neuro/Psych     Oriented x3: Yes   Mood/Affect: Normal         Dilation     Both eyes: 1.0% Mydriacyl, 2.5% Phenylephrine @ 10:04 AM           Slit Lamp and Fundus Exam     External Exam       Right Left   External Normal Normal         Slit Lamp Exam       Right Left   Lids/Lashes Normal Normal   Conjunctiva/Sclera White and quiet White and quiet   Cornea Clear Clear   Anterior Chamber Deep and quiet Deep and quiet   Iris Round and reactive, TI defect 530-630 Round and reactive   Lens Posterior chamber intraocular lens Posterior chamber intraocular lens   Anterior Vitreous Normal Normal         Fundus Exam       Right Left   Posterior Vitreous Posterior vitreous detachment Posterior vitreous detachment   Disc Normal Normal   C/D Ratio 0.5 0.5   Macula Retinal pigment epithelial atrophy in the FAZ, Advanced age related  macular degeneration, Hard drusen, no exudates, no hemorrhage, Mottling, Retinal pigment epithelial mottling, Geographic atrophy, Soft drusen, no macular thickening Hard drusen, no hemorrhage, no macular thickening, no exudates, Epiretinal membrane   Vessels Normal Normal   Periphery Normal Normal            IMAGING AND PROCEDURES  Imaging and Procedures for 06/21/21  OCT, Retina - OU - Both Eyes       Right Eye Quality was good. Scan locations included subfoveal. Central Foveal Thickness: 200. Progression has been stable. Findings include abnormal foveal contour, subretinal fluid, outer retinal atrophy, inner retinal atrophy, central retinal atrophy, retinal drusen , no SRF, no IRF.   Left Eye Quality was good. Scan locations included subfoveal. Central Foveal Thickness: 271. Progression has been stable. Findings include epiretinal membrane, no SRF, no IRF, retinal drusen .   Notes Central foveal atrophy with chronic residual subretinal fluid Yet stable overall at 66-month interval post Avastin.   Still no signs of recurrence of No risk of scotoma enlargement.  OD. CNVM will continue to monitor and observe OD  OS with minor epiretinal membrane nasal to  the fovea not center involved not impactful to vision       Intravitreal Injection, Pharmacologic Agent - OD - Right Eye       Time Out 06/21/2021. 10:51 AM. Confirmed correct patient, procedure, site, and patient consented.   Anesthesia Topical anesthesia was used. Anesthetic medications included Lidocaine 4%.   Procedure Preparation included 10% betadine to eyelids, 5% betadine to ocular surface, Ofloxacin . A 30 gauge needle was used.   Injection: 2.5 mg bevacizumab 2.5 MG/0.1ML   Route: Intravitreal, Site: Right Eye   NDC: [phone], Lot: [id number]   Post-op Post injection exam found visual acuity of at least counting fingers. The patient tolerated the procedure well. There were no complications. The patient  received written and verbal post procedure care education. Post injection medications were not given.              ASSESSMENT/PLAN:  Exudative age-related macular degeneration of right eye with active choroidal neovascularization (HCC) Currently chronically active subretinal fluid at 71-month intervals stable with stable acuity will continue to treat for maintenance purposes OD  Intermediate stage nonexudative age-related macular degeneration of left eye No sign of CNVM OS observe  Macular pucker, left eye Minor ,  not clinically important     ICD-10-CM   1. Exudative age-related macular degeneration of right eye with active choroidal neovascularization (HCC)  H35.3211 OCT, Retina - OU - Both Eyes    Intravitreal Injection, Pharmacologic Agent - OD - Right Eye    bevacizumab (AVASTIN) SOSY 2.5 mg    2. Intermediate stage nonexudative age-related macular degeneration of left eye  H35.3122     3. Macular pucker, left eye  H35.372       1.  OD vastly improved macular findings yet still chronic active disease with residual scarring limiting acuity.  Currently at 16-week interval follow-up, will repeat injection intravitreal Avastin today for maintenance therapy  2.  OS stable no sign of wet AMD  3.  Ophthalmic Meds Ordered this visit:  Meds ordered this encounter  Medications   bevacizumab (AVASTIN) SOSY 2.5 mg       Return in about 4 months (around 10/19/2021) for DILATE OU, AVASTIN OCT, OD.  There are no Patient Instructions on file for this visit.   Explained the diagnoses, plan, and follow up with the patient and they expressed understanding.  Patient expressed understanding of the importance of proper follow up care.   Clent Demark Nicklas Mcsweeney M.D. Diseases & Surgery of the Retina and Vitreous Retina & Diabetic [place] 06/21/21     Abbreviations: M myopia (nearsighted); A astigmatism; H hyperopia (farsighted); P presbyopia; Mrx spectacle prescription;  CTL contact  lenses; OD right eye; OS left eye; OU both eyes  XT exotropia; ET esotropia; PEK punctate epithelial keratitis; PEE punctate epithelial erosions; DES dry eye syndrome; MGD meibomian gland dysfunction; ATs artificial tears; PFAT's preservative free artificial tears; [place] nuclear sclerotic cataract; PSC posterior subcapsular cataract; ERM epi-retinal membrane; PVD posterior vitreous detachment; RD retinal detachment; DM diabetes mellitus; DR diabetic retinopathy; NPDR non-proliferative diabetic retinopathy; PDR proliferative diabetic retinopathy; CSME clinically significant macular edema; DME diabetic macular edema; dbh dot blot hemorrhages; CWS cotton wool spot; POAG primary open angle glaucoma; C/D cup-to-disc ratio; HVF humphrey visual field; GVF goldmann visual field; OCT optical coherence tomography; IOP intraocular pressure; BRVO Branch retinal vein occlusion; CRVO central retinal vein occlusion; CRAO central retinal artery occlusion; BRAO branch retinal artery occlusion; RT retinal tear; SB scleral buckle; PPV pars plana vitrectomy; VH  Vitreous hemorrhage; PRP panretinal laser photocoagulation; IVK intravitreal kenalog; VMT vitreomacular traction; MH Macular hole;  NVD neovascularization of the disc; NVE neovascularization elsewhere; AREDS age related eye disease study; ARMD age related macular degeneration; POAG primary open angle glaucoma; EBMD epithelial/anterior basement membrane dystrophy; ACIOL anterior chamber intraocular lens; IOL intraocular lens; PCIOL posterior chamber intraocular lens; Phaco/IOL phacoemulsification with intraocular lens placement; [place] photorefractive keratectomy; LASIK laser assisted in situ keratomileusis; HTN hypertension; DM diabetes mellitus; COPD chronic obstructive pulmonary disease

## 2021-06-21 NOTE — Assessment & Plan Note (Signed)
Minor ,  not clinically important

## 2021-06-21 NOTE — Assessment & Plan Note (Signed)
Currently chronically active subretinal fluid at 42-month intervals stable with stable acuity will continue to treat for maintenance purposes OD

## 2021-06-21 NOTE — Assessment & Plan Note (Signed)
No sign of CNVM OS observe

## 2021-06-23 DIAGNOSIS — E1165 Type 2 diabetes mellitus with hyperglycemia: Secondary | ICD-10-CM | POA: Diagnosis not present

## 2021-06-28 DIAGNOSIS — R768 Other specified abnormal immunological findings in serum: Secondary | ICD-10-CM | POA: Diagnosis not present

## 2021-06-28 DIAGNOSIS — M1711 Unilateral primary osteoarthritis, right knee: Secondary | ICD-10-CM | POA: Diagnosis not present

## 2021-06-28 DIAGNOSIS — K760 Fatty (change of) liver, not elsewhere classified: Secondary | ICD-10-CM | POA: Diagnosis not present

## 2021-06-28 DIAGNOSIS — L409 Psoriasis, unspecified: Secondary | ICD-10-CM | POA: Diagnosis not present

## 2021-06-28 DIAGNOSIS — M1009 Idiopathic gout, multiple sites: Secondary | ICD-10-CM | POA: Diagnosis not present

## 2021-07-01 DIAGNOSIS — I10 Essential (primary) hypertension: Secondary | ICD-10-CM | POA: Diagnosis not present

## 2021-07-01 DIAGNOSIS — I700 Atherosclerosis of aorta: Secondary | ICD-10-CM | POA: Diagnosis not present

## 2021-07-01 DIAGNOSIS — Z299 Encounter for prophylactic measures, unspecified: Secondary | ICD-10-CM | POA: Diagnosis not present

## 2021-07-01 DIAGNOSIS — E1165 Type 2 diabetes mellitus with hyperglycemia: Secondary | ICD-10-CM | POA: Diagnosis not present

## 2021-07-01 DIAGNOSIS — J069 Acute upper respiratory infection, unspecified: Secondary | ICD-10-CM | POA: Diagnosis not present

## 2021-07-14 DIAGNOSIS — R6889 Other general symptoms and signs: Secondary | ICD-10-CM | POA: Diagnosis not present

## 2021-07-14 DIAGNOSIS — J209 Acute bronchitis, unspecified: Secondary | ICD-10-CM | POA: Diagnosis not present

## 2021-07-14 DIAGNOSIS — I700 Atherosclerosis of aorta: Secondary | ICD-10-CM | POA: Diagnosis not present

## 2021-07-14 DIAGNOSIS — Z299 Encounter for prophylactic measures, unspecified: Secondary | ICD-10-CM | POA: Diagnosis not present

## 2021-07-14 DIAGNOSIS — I10 Essential (primary) hypertension: Secondary | ICD-10-CM | POA: Diagnosis not present

## 2021-07-23 DIAGNOSIS — E1165 Type 2 diabetes mellitus with hyperglycemia: Secondary | ICD-10-CM | POA: Diagnosis not present

## 2021-08-09 DIAGNOSIS — Z299 Encounter for prophylactic measures, unspecified: Secondary | ICD-10-CM | POA: Diagnosis not present

## 2021-08-09 DIAGNOSIS — E1165 Type 2 diabetes mellitus with hyperglycemia: Secondary | ICD-10-CM | POA: Diagnosis not present

## 2021-08-09 DIAGNOSIS — I10 Essential (primary) hypertension: Secondary | ICD-10-CM | POA: Diagnosis not present

## 2021-08-09 DIAGNOSIS — L4050 Arthropathic psoriasis, unspecified: Secondary | ICD-10-CM | POA: Diagnosis not present

## 2021-08-09 DIAGNOSIS — I700 Atherosclerosis of aorta: Secondary | ICD-10-CM | POA: Diagnosis not present

## 2021-08-09 DIAGNOSIS — E039 Hypothyroidism, unspecified: Secondary | ICD-10-CM | POA: Diagnosis not present

## 2021-08-22 DIAGNOSIS — E1165 Type 2 diabetes mellitus with hyperglycemia: Secondary | ICD-10-CM | POA: Diagnosis not present

## 2021-08-31 DIAGNOSIS — E1165 Type 2 diabetes mellitus with hyperglycemia: Secondary | ICD-10-CM | POA: Diagnosis not present

## 2021-08-31 DIAGNOSIS — L4050 Arthropathic psoriasis, unspecified: Secondary | ICD-10-CM | POA: Diagnosis not present

## 2021-08-31 DIAGNOSIS — I10 Essential (primary) hypertension: Secondary | ICD-10-CM | POA: Diagnosis not present

## 2021-08-31 DIAGNOSIS — Z299 Encounter for prophylactic measures, unspecified: Secondary | ICD-10-CM | POA: Diagnosis not present

## 2021-08-31 DIAGNOSIS — I700 Atherosclerosis of aorta: Secondary | ICD-10-CM | POA: Diagnosis not present

## 2021-08-31 DIAGNOSIS — D649 Anemia, unspecified: Secondary | ICD-10-CM | POA: Diagnosis not present

## 2021-08-31 DIAGNOSIS — Z79899 Other long term (current) drug therapy: Secondary | ICD-10-CM | POA: Diagnosis not present

## 2021-09-01 ENCOUNTER — Telehealth: Payer: Self-pay | Admitting: Gastroenterology

## 2021-09-01 NOTE — Telephone Encounter (Signed)
Received labs from Dr. Vyas today, drawn on 2/7, resulted on 2/8 (today). ° °CBC: WBC 6.0, hemoglobin 7.6 (L), hematocrit 25.8 (L), MCV 77 (L), MCH 22.7 (L), MCHC 29.5 (L), platelets 247. °CMP: Glucose 132 (H), BUN 14, creatinine 0.88, sodium 140, potassium 4.5, chloride 104, calcium 8.7, total protein 6.2, albumin 4.1, total bilirubin 0.3, alk phos 87, AST 53 (H), ALT 23. ° °Hemoglobin was previously 12.2 in October 2022. ° °Spoke with patient.  She reports a couple weeks of significant fatigue, unable to walk any sort of distance, and shortness of breath.  She denies BRBPR or melena.  She has been compliant with taking iron every day and also continues to take PPI twice daily. ° °I have recommended she proceed to the emergency room for evaluation of symptomatic anemia. She voiced understanding and stated she would go tonight or tomorrow to the emergency room.  °

## 2021-09-02 DIAGNOSIS — D649 Anemia, unspecified: Secondary | ICD-10-CM | POA: Diagnosis not present

## 2021-09-03 DIAGNOSIS — D649 Anemia, unspecified: Secondary | ICD-10-CM | POA: Diagnosis not present

## 2021-09-06 ENCOUNTER — Telehealth: Payer: Self-pay | Admitting: Internal Medicine

## 2021-09-06 NOTE — Telephone Encounter (Signed)
Fowarding to [place] as we don't have anything on pt.

## 2021-09-06 NOTE — Telephone Encounter (Signed)
Saving for [place] when she returns.

## 2021-09-06 NOTE — Telephone Encounter (Signed)
Noted  

## 2021-09-06 NOTE — Telephone Encounter (Signed)
Pt called to let Aliene Altes, PA know that she had 2 units of blood on Friday and was calling to schedule her EGD/TCS. [phone]

## 2021-09-11 ENCOUNTER — Other Ambulatory Visit: Payer: Self-pay | Admitting: Gastroenterology

## 2021-09-11 DIAGNOSIS — K219 Gastro-esophageal reflux disease without esophagitis: Secondary | ICD-10-CM

## 2021-09-14 ENCOUNTER — Other Ambulatory Visit: Payer: Self-pay | Admitting: Gastroenterology

## 2021-09-14 DIAGNOSIS — I10 Essential (primary) hypertension: Secondary | ICD-10-CM | POA: Diagnosis not present

## 2021-09-14 DIAGNOSIS — M810 Age-related osteoporosis without current pathological fracture: Secondary | ICD-10-CM | POA: Diagnosis not present

## 2021-09-14 DIAGNOSIS — E039 Hypothyroidism, unspecified: Secondary | ICD-10-CM | POA: Diagnosis not present

## 2021-09-14 DIAGNOSIS — E1165 Type 2 diabetes mellitus with hyperglycemia: Secondary | ICD-10-CM | POA: Diagnosis not present

## 2021-09-14 DIAGNOSIS — D509 Iron deficiency anemia, unspecified: Secondary | ICD-10-CM | POA: Diagnosis not present

## 2021-09-14 DIAGNOSIS — E7800 Pure hypercholesterolemia, unspecified: Secondary | ICD-10-CM | POA: Diagnosis not present

## 2021-09-14 MED ORDER — PEG 3350-KCL-NA BICARB-NACL 420 G PO SOLR: ORAL | 0 refills | Status: DC

## 2021-09-14 NOTE — Telephone Encounter (Signed)
Called pt. She has been scheduled for 3/6 at 11:15am. Aware will mail prep instructions and send rx to pharmacy. Advised will call back w/ pre-op appt. Advised of medications needed to be held and when.   PA approved via [facility] Auth# [id number], DOS: Sep 27, 2021 - Dec 26, 2021

## 2021-09-14 NOTE — Telephone Encounter (Signed)
Spoke with patient.  She reports she had 2 units of PRBCs at the outpatient surgical site in [place].  She is feeling much better.  She has not had any repeat blood work.  Recommended we get an updated CBC and I will discuss the possibility of scheduling procedures with Dr. Abbey Chatters as she has not been seen in the office since October.  Further recommendations to follow.  Placed lab orders for CBC at [facility].  Patient states she will have this completed today.

## 2021-09-14 NOTE — Telephone Encounter (Signed)
[facility] Clinical Pool:  Please let patient know I spoke with Dr. Abbey Chatters.  He is agreeable to proceeding with EGD and colonoscopy for further evaluation of acute on chronic anemia.  Please arrange ASAP. ASA 3  She needs to have CBC completed as ordered today.   She also needs INR updated with preop labs.

## 2021-09-14 NOTE — Addendum Note (Signed)
Addended by: Cheron Every on: 09/14/2021 03:43 PM   Modules accepted: Orders

## 2021-09-14 NOTE — Telephone Encounter (Signed)
Medication adjustments for procedure: Hold iron x10 days prior Hold vitamin E x10 days prior Hold Levsin x 3 days prior.   1 day prior to procedure: Take glipizide as prescribed, take metformin 500 mg in the morning, hold evening metformin.  Day of procedure: No morning diabetes medications.

## 2021-09-14 NOTE — Telephone Encounter (Addendum)
Pt on iron and glipizide, metformin

## 2021-09-15 LAB — CBC WITH DIFFERENTIAL/PLATELET
Basophils Absolute: 0 10*3/uL (ref 0.0–0.2)
Basos: 1 %
EOS (ABSOLUTE): 0 10*3/uL (ref 0.0–0.4)
Eos: 0 %
Hematocrit: 38.9 % (ref 34.0–46.6)
Hemoglobin: 11.5 g/dL (ref 11.1–15.9)
Immature Grans (Abs): 0 10*3/uL (ref 0.0–0.1)
Immature Granulocytes: 0 %
Lymphocytes Absolute: 1.8 10*3/uL (ref 0.7–3.1)
Lymphs: 29 %
MCH: 23.7 pg — ABNORMAL LOW (ref 26.6–33.0)
MCHC: 29.6 g/dL — ABNORMAL LOW (ref 31.5–35.7)
MCV: 80 fL (ref 79–97)
Monocytes Absolute: 0.5 10*3/uL (ref 0.1–0.9)
Monocytes: 9 %
Neutrophils Absolute: 3.8 10*3/uL (ref 1.4–7.0)
Neutrophils: 61 %
Platelets: 358 10*3/uL (ref 150–450)
RBC: 4.85 x10E6/uL (ref 3.77–5.28)
RDW: 16.8 % — ABNORMAL HIGH (ref 11.7–15.4)
WBC: 6.1 10*3/uL (ref 3.4–10.8)

## 2021-09-15 NOTE — Telephone Encounter (Signed)
Called pt and left details VM with pre-op appt details.

## 2021-09-21 DIAGNOSIS — E1165 Type 2 diabetes mellitus with hyperglycemia: Secondary | ICD-10-CM | POA: Diagnosis not present

## 2021-09-21 DIAGNOSIS — Z794 Long term (current) use of insulin: Secondary | ICD-10-CM | POA: Diagnosis not present

## 2021-09-21 NOTE — Patient Instructions (Signed)
Kristine Rocha  09/21/2021     @PREFPERIOPPHARMACY @   Your procedure is scheduled on  09/27/2021.   Report to [facility] at  [place].   Call this number if you have problems the morning of surgery:  [phone]   Remember:  Follow the diet and prep instructions given to you by the office.     DO NOT take any medications for diabetes the morning of your procedure.     Take these medicines the morning of surgery with A SIP OF WATER               nydrazid, levothyroxine, metoprolol, prilosec, zanaflex(if needed).     Do not wear jewelry, make-up or nail polish.  Do not wear lotions, powders, or perfumes, or deodorant.  Do not shave 48 hours prior to surgery.  Men may shave face and neck.  Do not bring valuables to the hospital.  [facility] is not responsible for any belongings or valuables.  Contacts, dentures or bridgework may not be worn into surgery.  Leave your suitcase in the car.  After surgery it may be brought to your room.  For patients admitted to the hospital, discharge time will be determined by your treatment team.  Patients discharged the day of surgery will not be allowed to drive home and must have someone with them for 24 hours.    Special instructions:   DO NOT smoke tobacco or vape for 24 hours before your procedure.  Please read over the following fact sheets that you were given. Anesthesia Post-op Instructions and Care and Recovery After Surgery      Upper Endoscopy, Adult, Care After This sheet gives you information about how to care for yourself after your procedure. Your health care provider may also give you more specific instructions. If you have problems or questions, contact your health care provider. What can I expect after the procedure? After the procedure, it is common to have: A sore throat. Mild stomach pain or discomfort. Bloating. Nausea. Follow these instructions at home:  Follow instructions from your health care  provider about what to eat or drink after your procedure. Return to your normal activities as told by your health care provider. Ask your health care provider what activities are safe for you. Take over-the-counter and prescription medicines only as told by your health care provider. If you were given a sedative during the procedure, it can affect you for several hours. Do not drive or operate machinery until your health care provider says that it is safe. Keep all follow-up visits as told by your health care provider. This is important. Contact a health care provider if you have: A sore throat that lasts longer than one day. Trouble swallowing. Get help right away if: You vomit blood or your vomit looks like coffee grounds. You have: A fever. Bloody, black, or tarry stools. A severe sore throat or you cannot swallow. Difficulty breathing. Severe pain in your chest or abdomen. Summary After the procedure, it is common to have a sore throat, mild stomach discomfort, bloating, and nausea. If you were given a sedative during the procedure, it can affect you for several hours. Do not drive or operate machinery until your health care provider says that it is safe. Follow instructions from your health care provider about what to eat or drink after your procedure. Return to your normal activities as told by your health care provider. This  information is not intended to replace advice given to you by your health care provider. Make sure you discuss any questions you have with your health care provider. Document Revised: 05/17/2019 Document Reviewed: 12/11/2017 Elsevier Patient Education  2022 [place]. Colonoscopy, Adult, Care After This sheet gives you information about how to care for yourself after your procedure. Your health care provider may also give you more specific instructions. If you have problems or questions, contact your health care provider. What can I expect after the  procedure? After the procedure, it is common to have: A small amount of blood in your stool for 24 hours after the procedure. Some gas. Mild cramping or bloating of your abdomen. Follow these instructions at home: Eating and drinking  Drink enough fluid to keep your urine pale yellow. Follow instructions from your health care provider about eating or drinking restrictions. Resume your normal diet as instructed by your health care provider. Avoid heavy or fried foods that are hard to digest. Activity Rest as told by your health care provider. Avoid sitting for a long time without moving. Get up to take short walks every 1-2 hours. This is important to improve blood flow and breathing. Ask for help if you feel weak or unsteady. Return to your normal activities as told by your health care provider. Ask your health care provider what activities are safe for you. Managing cramping and bloating  Try walking around when you have cramps or feel bloated. Apply heat to your abdomen as told by your health care provider. Use the heat source that your health care provider recommends, such as a moist heat pack or a heating pad. Place a towel between your skin and the heat source. Leave the heat on for 20-30 minutes. Remove the heat if your skin turns bright red. This is especially important if you are unable to feel pain, heat, or cold. You may have a greater risk of getting burned. General instructions If you were given a sedative during the procedure, it can affect you for several hours. Do not drive or operate machinery until your health care provider says that it is safe. For the first 24 hours after the procedure: Do not sign important documents. Do not drink alcohol. Do your regular daily activities at a slower pace than normal. Eat soft foods that are easy to digest. Take over-the-counter and prescription medicines only as told by your health care provider. Keep all follow-up visits as told by  your health care provider. This is important. Contact a health care provider if: You have blood in your stool 2-3 days after the procedure. Get help right away if you have: More than a small spotting of blood in your stool. Large blood clots in your stool. Swelling of your abdomen. Nausea or vomiting. A fever. Increasing pain in your abdomen that is not relieved with medicine. Summary After the procedure, it is common to have a small amount of blood in your stool. You may also have mild cramping and bloating of your abdomen. If you were given a sedative during the procedure, it can affect you for several hours. Do not drive or operate machinery until your health care provider says that it is safe. Get help right away if you have a lot of blood in your stool, nausea or vomiting, a fever, or increased pain in your abdomen. This information is not intended to replace advice given to you by your health care provider. Make sure you discuss any questions you  have with your health care provider. Document Revised: 05/17/2019 Document Reviewed: 02/04/2019 Elsevier Patient Education  [place] After This sheet gives you information about how to care for yourself after your procedure. Your health care provider may also give you more specific instructions. If you have problems or questions, contact your health care provider. What can I expect after the procedure? After the procedure, it is common to have: Tiredness. Forgetfulness about what happened after the procedure. Impaired judgment for important decisions. Nausea or vomiting. Some difficulty with balance. Follow these instructions at home: For the time period you were told by your health care provider:   Rest as needed. Do not participate in activities where you could fall or become injured. Do not drive or use machinery. Do not drink alcohol. Do not take sleeping pills or medicines that cause  drowsiness. Do not make important decisions or sign legal documents. Do not take care of children on your own. Eating and drinking Follow the diet that is recommended by your health care provider. Drink enough fluid to keep your urine pale yellow. If you vomit: Drink water, juice, or soup when you can drink without vomiting. Make sure you have little or no nausea before eating solid foods. General instructions Have a responsible adult stay with you for the time you are told. It is important to have someone help care for you until you are awake and alert. Take over-the-counter and prescription medicines only as told by your health care provider. If you have sleep apnea, surgery and certain medicines can increase your risk for breathing problems. Follow instructions from your health care provider about wearing your sleep device: Anytime you are sleeping, including during daytime naps. While taking prescription pain medicines, sleeping medicines, or medicines that make you drowsy. Avoid smoking. Keep all follow-up visits as told by your health care provider. This is important. Contact a health care provider if: You keep feeling nauseous or you keep vomiting. You feel light-headed. You are still sleepy or having trouble with balance after 24 hours. You develop a rash. You have a fever. You have redness or swelling around the IV site. Get help right away if: You have trouble breathing. You have new-onset confusion at home. Summary For several hours after your procedure, you may feel tired. You may also be forgetful and have poor judgment. Have a responsible adult stay with you for the time you are told. It is important to have someone help care for you until you are awake and alert. Rest as told. Do not drive or operate machinery. Do not drink alcohol or take sleeping pills. Get help right away if you have trouble breathing, or if you suddenly become confused. This information is not  intended to replace advice given to you by your health care provider. Make sure you discuss any questions you have with your health care provider. Document Revised: 03/26/2020 Document Reviewed: 06/13/2019 Elsevier Patient Education  2022 [employer].

## 2021-09-23 ENCOUNTER — Encounter (HOSPITAL_COMMUNITY)
Admission: RE | Admit: 2021-09-23 | Discharge: 2021-09-23 | Disposition: A | Discharge status: Home / Self Care | Payer: Medicare Other | Source: Ambulatory Visit | Attending: Internal Medicine | Admitting: Internal Medicine

## 2021-09-23 ENCOUNTER — Encounter (HOSPITAL_COMMUNITY): Payer: Self-pay

## 2021-09-23 DIAGNOSIS — Z01810 Encounter for preprocedural cardiovascular examination: Secondary | ICD-10-CM | POA: Diagnosis not present

## 2021-09-23 HISTORY — DX: Postural orthostatic tachycardia syndrome (POTS): G90.A

## 2021-09-27 ENCOUNTER — Ambulatory Visit (HOSPITAL_COMMUNITY)
Admission: RE | Admit: 2021-09-27 | Discharge: 2021-09-27 | Disposition: A | Discharge status: Home / Self Care | Payer: Medicare Other | Attending: Internal Medicine | Admitting: Internal Medicine

## 2021-09-27 ENCOUNTER — Ambulatory Visit (HOSPITAL_BASED_OUTPATIENT_CLINIC_OR_DEPARTMENT_OTHER): Payer: Medicare Other | Admitting: Anesthesiology

## 2021-09-27 ENCOUNTER — Encounter (HOSPITAL_COMMUNITY): Payer: Self-pay

## 2021-09-27 ENCOUNTER — Other Ambulatory Visit: Payer: Self-pay

## 2021-09-27 ENCOUNTER — Ambulatory Visit (HOSPITAL_COMMUNITY): Payer: Medicare Other | Admitting: Anesthesiology

## 2021-09-27 ENCOUNTER — Encounter (HOSPITAL_COMMUNITY)
Admission: RE | Disposition: A | Discharge status: Home / Self Care | Payer: Self-pay | Source: Home / Self Care | Attending: Internal Medicine

## 2021-09-27 ENCOUNTER — Telehealth: Payer: Self-pay | Admitting: Internal Medicine

## 2021-09-27 DIAGNOSIS — K219 Gastro-esophageal reflux disease without esophagitis: Secondary | ICD-10-CM | POA: Diagnosis not present

## 2021-09-27 DIAGNOSIS — D649 Anemia, unspecified: Secondary | ICD-10-CM

## 2021-09-27 DIAGNOSIS — K2970 Gastritis, unspecified, without bleeding: Secondary | ICD-10-CM | POA: Insufficient documentation

## 2021-09-27 DIAGNOSIS — E039 Hypothyroidism, unspecified: Secondary | ICD-10-CM | POA: Diagnosis not present

## 2021-09-27 DIAGNOSIS — K648 Other hemorrhoids: Secondary | ICD-10-CM | POA: Diagnosis not present

## 2021-09-27 DIAGNOSIS — D509 Iron deficiency anemia, unspecified: Secondary | ICD-10-CM | POA: Insufficient documentation

## 2021-09-27 DIAGNOSIS — K5730 Diverticulosis of large intestine without perforation or abscess without bleeding: Secondary | ICD-10-CM | POA: Insufficient documentation

## 2021-09-27 DIAGNOSIS — K7460 Unspecified cirrhosis of liver: Secondary | ICD-10-CM | POA: Insufficient documentation

## 2021-09-27 DIAGNOSIS — K319 Disease of stomach and duodenum, unspecified: Secondary | ICD-10-CM | POA: Diagnosis not present

## 2021-09-27 DIAGNOSIS — E119 Type 2 diabetes mellitus without complications: Secondary | ICD-10-CM

## 2021-09-27 DIAGNOSIS — I10 Essential (primary) hypertension: Secondary | ICD-10-CM

## 2021-09-27 HISTORY — PX: ESOPHAGOGASTRODUODENOSCOPY (EGD) WITH PROPOFOL: SHX5813

## 2021-09-27 HISTORY — PX: BIOPSY: SHX5522

## 2021-09-27 HISTORY — PX: COLONOSCOPY WITH PROPOFOL: SHX5780

## 2021-09-27 LAB — GLUCOSE, CAPILLARY: Glucose-Capillary: 166 mg/dL — ABNORMAL HIGH (ref 70–99)

## 2021-09-27 SURGERY — COLONOSCOPY WITH PROPOFOL
Anesthesia: General

## 2021-09-27 MED ORDER — PROPOFOL 500 MG/50ML IV EMUL
INTRAVENOUS | Status: DC | PRN
  Administered 2021-09-27: 150 ug/kg/min via INTRAVENOUS

## 2021-09-27 MED ORDER — LIDOCAINE HCL (CARDIAC) PF 100 MG/5ML IV SOSY
PREFILLED_SYRINGE | INTRAVENOUS | Status: DC | PRN
Start: 2021-09-27 — End: 2021-09-27
  Administered 2021-09-27: 50 mg via INTRAVENOUS

## 2021-09-27 MED ORDER — LACTATED RINGERS IV SOLN: INTRAVENOUS | Status: DC

## 2021-09-27 MED ORDER — PROPOFOL 10 MG/ML IV BOLUS
INTRAVENOUS | Status: DC | PRN
  Administered 2021-09-27: 100 mg via INTRAVENOUS

## 2021-09-27 NOTE — Telephone Encounter (Signed)
Patient presented for EGD and colonoscopy today.  Unfortunately her colon was not adequately prepped.  She states she vomited up her GoLytely last night.  Can we set her up for repeat colonoscopy within 3 months or so.  We will need to use different prep.  Thank you ?

## 2021-09-27 NOTE — Discharge Instructions (Addendum)
EGD ?Discharge instructions ?Please read the instructions outlined below and refer to this sheet in the next few weeks. These discharge instructions provide you with general information on caring for yourself after you leave the hospital. Your doctor may also give you specific instructions. While your treatment has been planned according to the most current medical practices available, unavoidable complications occasionally occur. If you have any problems or questions after discharge, please call your doctor. ?ACTIVITY ?You may resume your regular activity but move at a slower pace for the next 24 hours.  ?Take frequent rest periods for the next 24 hours.  ?Walking will help expel (get rid of) the air and reduce the bloated feeling in your abdomen.  ?No driving for 24 hours (because of the anesthesia (medicine) used during the test).  ?You may shower.  ?Do not sign any important legal documents or operate any machinery for 24 hours (because of the anesthesia used during the test).  ?NUTRITION ?Drink plenty of fluids.  ?You may resume your normal diet.  ?Begin with a light meal and progress to your normal diet.  ?Avoid alcoholic beverages for 24 hours or as instructed by your caregiver.  ?MEDICATIONS ?You may resume your normal medications unless your caregiver tells you otherwise.  ?WHAT YOU CAN EXPECT TODAY ?You may experience abdominal discomfort such as a feeling of fullness or ?gas? pains.  ?FOLLOW-UP ?Your doctor will discuss the results of your test with you.  ?SEEK IMMEDIATE MEDICAL ATTENTION IF ANY OF THE FOLLOWING OCCUR: ?Excessive nausea (feeling sick to your stomach) and/or vomiting.  ?Severe abdominal pain and distention (swelling).  ?Trouble swallowing.  ?Temperature over 101? F (37.8? C).  ?Rectal bleeding or vomiting of blood.  ? ? ?Colonoscopy ?Discharge Instructions ? ?Read the instructions outlined below and refer to this sheet in the next few weeks. These discharge instructions provide you with  general information on caring for yourself after you leave the hospital. Your doctor may also give you specific instructions. While your treatment has been planned according to the most current medical practices available, unavoidable complications occasionally occur.  ? ?ACTIVITY ?You may resume your regular activity, but move at a slower pace for the next 24 hours.  ?Take frequent rest periods for the next 24 hours.  ?Walking will help get rid of the air and reduce the bloated feeling in your belly (abdomen).  ?No driving for 24 hours (because of the medicine (anesthesia) used during the test).   ?Do not sign any important legal documents or operate any machinery for 24 hours (because of the anesthesia used during the test).  ?NUTRITION ?Drink plenty of fluids.  ?You may resume your normal diet as instructed by your doctor.  ?Begin with a light meal and progress to your normal diet. Heavy or fried foods are harder to digest and may make you feel sick to your stomach (nauseated).  ?Avoid alcoholic beverages for 24 hours or as instructed.  ?MEDICATIONS ?You may resume your normal medications unless your doctor tells you otherwise.  ?WHAT YOU CAN EXPECT TODAY ?Some feelings of bloating in the abdomen.  ?Passage of more gas than usual.  ?Spotting of blood in your stool or on the toilet paper.  ?IF YOU HAD POLYPS REMOVED DURING THE COLONOSCOPY: ?No aspirin products for 7 days or as instructed.  ?No alcohol for 7 days or as instructed.  ?Eat a soft diet for the next 24 hours.  ?FINDING OUT THE RESULTS OF YOUR TEST ?Not all test results are available  during your visit. If your test results are not back during the visit, make an appointment with your caregiver to find out the results. Do not assume everything is normal if you have not heard from your caregiver or the medical facility. It is important for you to follow up on all of your test results.  ?SEEK IMMEDIATE MEDICAL ATTENTION IF: ?You have more than a spotting of  blood in your stool.  ?Your belly is swollen (abdominal distention).  ?You are nauseated or vomiting.  ?You have a temperature over 101.  ?You have abdominal pain or discomfort that is severe or gets worse throughout the day.  ? ?Your EGD revealed mild amount inflammation in your stomach.  I took biopsies of this to rule out infection with a bacteria called H. pylori.  Await pathology results, my office will contact you.  Continue on omeprazole daily. ? ?Unfortunately your colon was not adequately prepped on the right side.  Would recommend repeat colonoscopy in 3 to 6 months with different colon prep.  Otherwise follow-up with GI in 3 to 4 months.  ? ? ?I hope you have a great rest of your week! ? ?Elon Alas. Abbey Chatters, D.O. ?Gastroenterology and Hepatology ?[facility] ? ? ? ? ?PLEASE CALL THE OFFICE AT [phone], NEED FOLLOW UP APPOINTMENT WITH KRISTEN IN 4 MONTHS  ?

## 2021-09-27 NOTE — Op Note (Addendum)
[name] ?Patient Name: Kristine Rocha ?Procedure Date: 09/27/2021 12:06 PM ?MRN: [id number] ?Date of Birth: [birth year] ?Attending MD: Elon Alas. Abbey Chatters , DO ?CSN: [id number] ?Age: 66 ?Admit Type: Outpatient ?Procedure:                Upper GI endoscopy ?Indications:              Iron deficiency anemia, Cirrhosis rule out  ?                          esophageal varices ?Providers:                Elon Alas. Abbey Chatters, DO, Janeece Riggers, RN, Eugene Garnet  ?                          Shanon Brow, Technician ?Referring MD:              ?Medicines:                See the Anesthesia note for documentation of the  ?                          administered medications ?Complications:            No immediate complications. ?Estimated Blood Loss:     Estimated blood loss was minimal. ?Procedure:                Pre-Anesthesia Assessment: ?                          - The anesthesia plan was to use monitored  ?                          anesthesia care (MAC). ?                          After obtaining informed consent, the endoscope was  ?                          passed under direct vision. Throughout the  ?                          procedure, the patient's blood pressure, pulse, and  ?                          oxygen saturations were monitored continuously. The  ?                          GIF-H190 ([id number]) scope was introduced through the  ?                          mouth, and advanced to the second part of duodenum.  ?                          The upper GI endoscopy was accomplished without  ?                          difficulty. The patient tolerated the procedure  ?  well. ?Scope In: 12:13:08 PM ?Scope Out: 12:17:26 PM ?Total Procedure Duration: 0 hours 4 minutes 18 seconds  ?Findings: ?     The Z-line was regular and was found 36 cm from the incisors. ?     There is no endoscopic evidence of varices in the entire esophagus. ?     Localized moderate inflammation characterized by erosions and erythema  ?     was found  at the pylorus. Mucosa quite friable. Biopsies were taken with  ?     a cold forceps for Helicobacter pylori testing. ?     The duodenal bulb, first portion of the duodenum and second portion of  ?     the duodenum were normal. ?Impression:               - Z-line regular, 36 cm from the incisors. ?                          - Gastritis. Biopsied. ?                          - Normal duodenal bulb, first portion of the  ?                          duodenum and second portion of the duodenum. ?Moderate Sedation: ?     Per Anesthesia Care ?Recommendation:           - Patient has a contact number available for  ?                          emergencies. The signs and symptoms of potential  ?                          delayed complications were discussed with the  ?                          patient. Return to normal activities tomorrow.  ?                          Written discharge instructions were provided to the  ?                          patient. ?                          - Resume previous diet. ?                          - Continue present medications. ?                          - Await pathology results. ?                          - Use a proton pump inhibitor PO BID. ?                          - Return to GI clinic in 4 months. ?Procedure Code(s):        ---  Professional --- ?                          [phone], Esophagogastroduodenoscopy, flexible,  ?                          transoral; with biopsy, single or multiple ?Diagnosis Code(s):        --- Professional --- ?                          K29.70, Gastritis, unspecified, without bleeding ?                          D50.9, Iron deficiency anemia, unspecified ?                          K74.60, Unspecified cirrhosis of liver ?CPT copyright 2019 American Medical Association. All rights reserved. ?The codes documented in this report are preliminary and upon coder review may  ?be revised to meet current compliance requirements. ?Elon Alas. Abbey Chatters, DO ?Elon Alas. [place],  DO ?09/27/2021 12:19:44 PM ?This report has been signed electronically. ?Number of Addenda: 0 ?

## 2021-09-27 NOTE — H&P (Signed)
Primary Care Physician:  Glenda Chroman, MD Primary Gastroenterologist:  Dr. Abbey Chatters  Pre-Procedure History & Physical: HPI:  Kristine Rocha is a 66 y.o. female is here for an EGD and colonoscopy to be performed for IDA and cirrhosis.   Past Medical History:  Diagnosis Date   Asthma    Cirrhosis ([place]) 06/2019   Immune to Hep A. Has completed Hep B vaccine (May 2021)   Diabetes [facility])    Essential hypertension    GERD (gastroesophageal reflux disease)    History of GI bleed    Hypothyroidism    Macular degeneration    wet in right, dry in left, has injections for this.   Microcytic anemia    Palpitations    Peptic ulcer disease    Postural orthostatic tachycardia syndrome    Mildly orthostatic results tilt table test 2005  - Dr. 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    ESOPHAGOGASTRODUODENOSCOPY (EGD) WITH PROPOFOL N/A 11/04/2019   Procedure: ESOPHAGOGASTRODUODENOSCOPY (EGD) WITH PROPOFOL;  Surgeon: Daneil Dolin, MD; normal esophagus s/p empiric dilation, abnormal appearing gastric mucosa s/p biopsy, abnormal duodenal mucosa s/p biopsied, no ulcer, infiltrating process, or portal gastropathy.  Duodenal biopsies benign.  Gastric biopsy with reactive gastropathy with erosions, no H. pylori.    MALONEY DILATION N/A 11/04/2019   Procedure: Venia Minks DILATION;  Surgeon: Daneil Dolin, MD;  Location: [facility];  Service: Endoscopy;   Laterality: N/A;   TILT TABLE STUDY  2005   [facility]   TRIGGER FINGER RELEASE Right    2nd finger    Prior to Admission medications   Medication Sig Start Date End Date Taking? Authorizing Provider  atorvastatin (LIPITOR) 10 MG tablet Take 10 mg by mouth every evening.    Yes [provider]  cetirizine (ZYRTEC) 10 MG tablet Take 10 mg by mouth daily.   Yes [provider]  CHROMIUM-CINNAMON PO Take 1 capsule by mouth every evening. With Biotin   Yes [provider]  Cyanocobalamin (VITAMIN B-12 PO) Take 1 tablet by mouth daily.   Yes [provider]  diphenhydrAMINE (BENADRYL) 25 MG tablet Take 25 mg by mouth at bedtime as needed for sleep.   Yes [provider]  Ferrous Sulfate (IRON) 325 (65 Fe) MG TABS Take 325 mg by mouth daily. With Vit C   Yes [provider]  insulin lispro (HUMALOG) 100 UNIT/ML KwikPen Inject 3-5 Units into the skin 3 (three) times daily. Per sliding scale   Yes [provider]  isoniazid (NYDRAZID) 300 MG tablet Take 1 tablet (300 mg total) by mouth daily. 04/13/21  Yes Tommy Medal, Lavell Islam, MD  JARDIANCE 25 MG TABS tablet Take 25 mg by mouth daily. 09/14/21  Yes [provider]  ketoconazole (NIZORAL) 2 % cream Apply 1 application topically daily. Groin area   Yes [provider]  levothyroxine (SYNTHROID)  88 MCG tablet Take 88 mcg by mouth daily before breakfast. 04/14/19  Yes [provider]  Melatonin 10 MG TABS Take 10 mg by mouth at bedtime.    Yes [provider]  metFORMIN (GLUCOPHAGE-XR) 500 MG 24 hr tablet Take 500 mg by mouth 2 (two) times daily. 03/04/17  Yes [provider]  metoprolol succinate (TOPROL-XL) 100 MG 24 hr tablet Take 100 mg by mouth daily. *Hold for low blood pressure*   Yes [provider]  Multiple Vitamins-Minerals (PRESERVISION AREDS 2 PO) Take 1 tablet by mouth 2 (two) times daily.   Yes [provider]  omeprazole  (PRILOSEC) 40 MG capsule TAKE 1 CAPSULE BY MOUTH  TWICE DAILY BEFORE A MEAL 09/13/21  Yes Mahon, Courtney L, NP  OVER THE COUNTER MEDICATION Take 1 capsule by mouth daily. Fruits and Vegetables   Yes [provider]  pyridOXINE (VITAMIN B-6) 50 MG tablet Take 1 tablet (50 mg total) by mouth daily. 02/01/21  Yes Tommy Medal, Lavell Islam, MD  tiZANidine (ZANAFLEX) 4 MG tablet Take 4 mg by mouth 3 (three) times daily as needed for muscle spasms.  06/14/19  Yes [provider]  TRESIBA FLEXTOUCH 200 UNIT/ML FlexTouch Pen Inject 15 Units into the skin daily. 09/14/21  Yes [provider]  valsartan (DIOVAN) 40 MG tablet Take 40 mg by mouth daily. 09/04/21  Yes [provider]  hyoscyamine (LEVSIN SL) 0.125 MG SL tablet Place 0.125 mg under the tongue 3 (three) times daily as needed for cramping. 01/13/20   [provider]  [facility] VERIO test strip  12/06/16   [provider]  polyethylene glycol-electrolytes (NULYTELY) 420 g solution As directed 09/14/21   Eloise Harman, DO    Allergies as of 09/14/2021 - Review Complete 06/21/2021  Allergen Reaction Noted   Amoxicillin Hives, Shortness Of Breath, and Rash 11/19/2014   Ampicillin Hives, Shortness Of Breath, and Rash 11/19/2014   Methotrexate derivatives Other (See Comments) 02/01/2021   Dust mite extract  10/28/2020   Molds & smuts  10/28/2020   Other  11/19/2014   Prevacid [lansoprazole] Diarrhea 06/24/2019    Family History  Problem Relation Age of Onset   Heart attack Father        Age 68   Arrhythmia Sister        WPW s/p ablation- corrected at [facility]   Alzheimer's disease Mother    Pneumonia Mother        Aspiration   Heart attack Brother    Heart attack Maternal Grandfather    Colon cancer Maternal Aunt        diagnosed at 63   Breast cancer Maternal Grandmother     Social History   Socioeconomic History   Marital status: Married    Spouse name: Not on file   Number of  children: Not on file   Years of education: Not on file   Highest education level: Not on file  Occupational History   Not on file  Tobacco Use   Smoking status: Never   Smokeless tobacco: Never  Vaping Use   Vaping Use: Never used  Substance and Sexual Activity   Alcohol use: No    Alcohol/week: 0.0 standard drinks   Drug use: No   Sexual activity: Yes  Other Topics Concern   Not on file  Social History Narrative   Not on file   Social Determinants of Health   Financial Resource Strain: Not on file  Food Insecurity:  Not on file  Transportation Needs: Not on file  Physical Activity: Not on file  Stress: Not on file  Social Connections: Not on file  Intimate Partner Violence: Not on file    Review of Systems: See HPI, otherwise negative ROS  Physical Exam: Vital signs in last 24 hours: Temp:  [98.2 F (36.8 C)] 98.2 F (36.8 C) (03/06 0954) Pulse Rate:  [72] 72 (03/06 0954) Resp:  [13] 13 (03/06 0954) BP: (101)/(58) 101/58 (03/06 0954) SpO2:  [98 %] 98 % (03/06 0954) Weight:  [88.9 kg] 88.9 kg (03/06 0954)   General:   Alert,  Well-developed, well-nourished, pleasant and cooperative in NAD Head:  Normocephalic and atraumatic. Eyes:  Sclera clear, no icterus.   Conjunctiva pink. Ears:  Normal auditory acuity. Nose:  No deformity, discharge,  or lesions. Mouth:  No deformity or lesions, dentition normal. Neck:  Supple; no masses or thyromegaly. Lungs:  Clear throughout to auscultation.   No wheezes, crackles, or rhonchi. No acute distress. Heart:  Regular rate and rhythm; no murmurs, clicks, rubs,  or gallops. Abdomen:  Soft, nontender and nondistended. No masses, hepatosplenomegaly or hernias noted. Normal bowel sounds, without guarding, and without rebound.   Msk:  Symmetrical without gross deformities. Normal posture. Extremities:  Without clubbing or edema. Neurologic:  Alert and  oriented x4;  grossly normal neurologically. Skin:  Intact without significant  lesions or rashes. Cervical Nodes:  No significant cervical adenopathy. Psych:  Alert and cooperative. Normal mood and affect.  Impression/Plan: Kristine Rocha is here for an EGD and colonoscopy to be performed for IDA and cirrhosis.   The risks of the procedure including infection, bleed, or perforation as well as benefits, limitations, alternatives and imponderables have been reviewed with the patient. Questions have been answered. All parties agreeable.

## 2021-09-27 NOTE — Transfer of Care (Signed)
Immediate Anesthesia Transfer of Care Note ? ?Patient: Kristine Rocha ? ?Procedure(s) Performed: COLONOSCOPY WITH PROPOFOL ?ESOPHAGOGASTRODUODENOSCOPY (EGD) WITH PROPOFOL ?BIOPSY ? ?Patient Location: Short Stay ? ?Anesthesia Type:General ? ?Level of Consciousness: awake and oriented ? ?Airway & Oxygen Therapy: Patient Spontanous Breathing ? ?Post-op Assessment: Report given to RN and Post -op Vital signs reviewed and stable ? ?Post vital signs: Reviewed and stable ? ?Last Vitals:  ?Vitals Value Taken Time  ?BP    ?Temp    ?Pulse    ?Resp    ?SpO2    ? ? ?Last Pain:  ?Vitals:  ? 09/27/21 1221  ?TempSrc:   ?PainSc: 6   ?   ? ?  ? ?Complications: No notable events documented. ?

## 2021-09-27 NOTE — Anesthesia Procedure Notes (Signed)
Date/Time: 09/27/2021 12:22 PM ?Performed by: Orlie Dakin, CRNA ?Pre-anesthesia Checklist: Patient identified, Emergency Drugs available, Suction available and Patient being monitored ?Patient Re-evaluated:Patient Re-evaluated prior to induction ?Oxygen Delivery Method: Nasal cannula ?Induction Type: IV induction ?Placement Confirmation: positive ETCO2 ? ? ? ? ?

## 2021-09-27 NOTE — Op Note (Signed)
[facility] ?Patient Name: Kristine Rocha ?Procedure Date: 09/27/2021 12:06 PM ?MRN: [id number] ?Date of Birth: 1955/12/27 ?Attending MD: Elon Alas. Abbey Chatters , DO ?CSN: [id number] ?Age: 66 ?Admit Type: Outpatient ?Procedure:                Colonoscopy ?Indications:              Iron deficiency anemia ?Providers:                Elon Alas. Abbey Chatters, DO, Janeece Riggers, RN, Eugene Garnet  ?                          Shanon Brow, Technician ?Referring MD:              ?Medicines:                See the Anesthesia note for documentation of the  ?                          administered medications ?Complications:            No immediate complications. ?Estimated Blood Loss:     Estimated blood loss: none. ?Procedure:                Pre-Anesthesia Assessment: ?                          - The anesthesia plan was to use monitored  ?                          anesthesia care (MAC). ?                          After obtaining informed consent, the colonoscope  ?                          was passed under direct vision. Throughout the  ?                          procedure, the patient's blood pressure, pulse, and  ?                          oxygen saturations were monitored continuously. The  ?                          PCF-HQ190L ([id number]) scope was introduced through  ?                          the anus and advanced to the the cecum, identified  ?                          by appendiceal orifice and ileocecal valve. The  ?                          colonoscopy was performed without difficulty. The  ?                          patient tolerated the procedure well. The quality  ?  of the bowel preparation was evaluated using the  ?                          BBPS [facility] Bowel Preparation Scale) with scores  ?                          of: Right Colon = 0 (unprepared, mucosa not seen  ?                          due to solid stool that cannot be cleared or unseen  ?                          proximal colon segment in a colonoscopy  aborted due  ?                          to inadequate bowel prep), Transverse Colon = 2  ?                          (minor amount of residual staining, small fragments  ?                          of stool and/or opaque liquid, but mucosa seen  ?                          well) and Left Colon = 2 (minor amount of residual  ?                          staining, small fragments of stool and/or opaque  ?                          liquid, but mucosa seen well). The total BBPS score  ?                          equals 4. The quality of the bowel preparation was  ?                          inadequate. ?Scope In: 12:21:24 PM ?Scope Out: 12:26:33 PM ?Scope Withdrawal Time: 0 hours 1 minute 18 seconds  ?Total Procedure Duration: 0 hours 5 minutes 9 seconds  ?Findings: ?     The perianal and digital rectal examinations were normal. ?     Non-bleeding internal hemorrhoids were found during endoscopy. ?     Multiple small-mouthed diverticula were found in the sigmoid colon. ?     Extensive amounts of stool was found in the transverse colon, in the  ?     ascending colon and in the cecum, precluding visualization. ?Impression:               - Preparation of the colon was inadequate. ?                          - Non-bleeding internal hemorrhoids. ?                          -  Diverticulosis in the sigmoid colon. ?                          - Stool in the transverse colon, in the ascending  ?                          colon and in the cecum. ?                          - No specimens collected. ?Moderate Sedation: ?     Per Anesthesia Care ?Recommendation:           - Patient has a contact number available for  ?                          emergencies. The signs and symptoms of potential  ?                          delayed complications were discussed with the  ?                          patient. Return to normal activities tomorrow.  ?                          Written discharge instructions were provided to the  ?                           patient. ?                          - Resume previous diet. ?                          - Continue present medications. ?                          - Repeat colonoscopy in 3 months because the bowel  ?                          preparation was suboptimal. ?                          - Return to GI clinic after studies are complete. ?Procedure Code(s):        --- Professional --- ?                          [phone], Colonoscopy, flexible; diagnostic, including  ?                          collection of specimen(s) by brushing or washing,  ?                          when performed (separate procedure) ?Diagnosis Code(s):        --- Professional --- ?  K64.8, Other hemorrhoids ?                          D50.9, Iron deficiency anemia, unspecified ?                          K57.30, Diverticulosis of large intestine without  ?                          perforation or abscess without bleeding ?CPT copyright 2019 American Medical Association. All rights reserved. ?The codes documented in this report are preliminary and upon coder review may  ?be revised to meet current compliance requirements. ?Elon Alas. Abbey Chatters, DO ?Elon Alas. [place], DO ?09/27/2021 12:28:56 PM ?This report has been signed electronically. ?Number of Addenda: 0 ?

## 2021-09-27 NOTE — Anesthesia Preprocedure Evaluation (Signed)
Anesthesia Evaluation  ?Patient identified by MRN, date of birth, ID band ?Patient awake ? ? ? ?Reviewed: ?Allergy & Precautions, H&P , NPO status , Patient's Chart, lab work & pertinent test results, reviewed documented beta blocker date and time  ? ?Airway ?Mallampati: II ? ?TM Distance: >3 FB ?Neck ROM: full ? ? ? Dental ?no notable dental hx. ? ?  ?Pulmonary ?asthma ,  ?  ?Pulmonary exam normal ?breath sounds clear to auscultation ? ? ? ? ? ? Cardiovascular ?Exercise Tolerance: Good ?hypertension, negative cardio ROS ? ? ?Rhythm:regular Rate:Normal ? ? ?  ?Neuro/Psych ?negative neurological ROS ? negative psych ROS  ? GI/Hepatic ?Neg liver ROS, PUD, GERD  Medicated,  ?Endo/Other  ?diabetes, Type 2Hypothyroidism Morbid obesity ? Renal/GU ?negative Renal ROS  ?negative genitourinary ?  ?Musculoskeletal ? ? Abdominal ?  ?Peds ? Hematology ? ?(+) Blood dyscrasia, anemia ,   ?Anesthesia Other Findings ? ? Reproductive/Obstetrics ?negative OB ROS ? ?  ? ? ? ? ? ? ? ? ? ? ? ? ? ?  ?  ? ? ? ? ? ? ? ? ?Anesthesia Physical ?Anesthesia Plan ? ?ASA: 3 ? ?Anesthesia Plan: General  ? ?Post-op Pain Management:   ? ?Induction:  ? ?PONV Risk Score and Plan: Propofol infusion ? ?Airway Management Planned:  ? ?Additional Equipment:  ? ?Intra-op Plan:  ? ?Post-operative Plan:  ? ?Informed Consent: I have reviewed the patients History and Physical, chart, labs and discussed the procedure including the risks, benefits and alternatives for the proposed anesthesia with the patient or authorized representative who has indicated his/her understanding and acceptance.  ? ? ? ?Dental Advisory Given ? ?Plan Discussed with: CRNA ? ?Anesthesia Plan Comments:   ? ? ? ? ? ? ?Anesthesia Quick Evaluation ? ?

## 2021-09-28 LAB — SURGICAL PATHOLOGY

## 2021-09-28 NOTE — Telephone Encounter (Signed)
Do you want her to come in for OV or can she be triaged? ?

## 2021-09-28 NOTE — Anesthesia Postprocedure Evaluation (Signed)
Anesthesia Post Note ? ?Patient: Kristine Rocha ? ?Procedure(s) Performed: COLONOSCOPY WITH PROPOFOL ?ESOPHAGOGASTRODUODENOSCOPY (EGD) WITH PROPOFOL ?BIOPSY ? ?Patient location during evaluation: Phase II ?Anesthesia Type: General ?Level of consciousness: awake ?Pain management: pain level controlled ?Vital Signs Assessment: post-procedure vital signs reviewed and stable ?Respiratory status: spontaneous breathing and respiratory function stable ?Cardiovascular status: blood pressure returned to baseline and stable ?Postop Assessment: no headache and no apparent nausea or vomiting ?Anesthetic complications: no ?Comments: Late entry ? ? ?No notable events documented. ? ? ?Last Vitals:  ?Vitals:  ? 09/27/21 0954 09/27/21 1231  ?BP: (!) 101/58 (!) 121/55  ?Pulse: 72 77  ?Resp: 13 18  ?Temp: 36.8 ?C 36.7 ?C  ?SpO2: 98% 100%  ?  ?Last Pain:  ?Vitals:  ? 09/27/21 1231  ?TempSrc: Oral  ?PainSc: 0-No pain  ? ? ?  ?  ?  ?  ?  ?  ? ?Louann Sjogren ? ? ? ? ?

## 2021-09-29 NOTE — Telephone Encounter (Signed)
Triage okay, thanks ?

## 2021-09-30 ENCOUNTER — Encounter (HOSPITAL_COMMUNITY): Payer: Self-pay | Admitting: Internal Medicine

## 2021-10-04 NOTE — Telephone Encounter (Signed)
Okay noted

## 2021-10-04 NOTE — Telephone Encounter (Signed)
Pt is already on recall to follow up with RMR in April 2023 ?

## 2021-10-13 DIAGNOSIS — M1009 Idiopathic gout, multiple sites: Secondary | ICD-10-CM | POA: Diagnosis not present

## 2021-10-13 DIAGNOSIS — L409 Psoriasis, unspecified: Secondary | ICD-10-CM | POA: Diagnosis not present

## 2021-10-13 DIAGNOSIS — D500 Iron deficiency anemia secondary to blood loss (chronic): Secondary | ICD-10-CM | POA: Diagnosis not present

## 2021-10-13 DIAGNOSIS — R768 Other specified abnormal immunological findings in serum: Secondary | ICD-10-CM | POA: Diagnosis not present

## 2021-10-13 DIAGNOSIS — R5383 Other fatigue: Secondary | ICD-10-CM | POA: Diagnosis not present

## 2021-10-13 DIAGNOSIS — K7460 Unspecified cirrhosis of liver: Secondary | ICD-10-CM | POA: Diagnosis not present

## 2021-10-19 ENCOUNTER — Encounter (INDEPENDENT_AMBULATORY_CARE_PROVIDER_SITE_OTHER): Payer: Self-pay | Admitting: Ophthalmology

## 2021-10-19 ENCOUNTER — Other Ambulatory Visit: Payer: Self-pay

## 2021-10-19 ENCOUNTER — Ambulatory Visit (INDEPENDENT_AMBULATORY_CARE_PROVIDER_SITE_OTHER): Payer: Medicare Other | Admitting: Ophthalmology

## 2021-10-19 ENCOUNTER — Encounter (INDEPENDENT_AMBULATORY_CARE_PROVIDER_SITE_OTHER): Payer: Medicare Other | Admitting: Ophthalmology

## 2021-10-19 DIAGNOSIS — H353114 Nonexudative age-related macular degeneration, right eye, advanced atrophic with subfoveal involvement: Secondary | ICD-10-CM | POA: Diagnosis not present

## 2021-10-19 DIAGNOSIS — H353122 Nonexudative age-related macular degeneration, left eye, intermediate dry stage: Secondary | ICD-10-CM | POA: Diagnosis not present

## 2021-10-19 DIAGNOSIS — H35721 Serous detachment of retinal pigment epithelium, right eye: Secondary | ICD-10-CM

## 2021-10-19 DIAGNOSIS — H353211 Exudative age-related macular degeneration, right eye, with active choroidal neovascularization: Secondary | ICD-10-CM

## 2021-10-19 DIAGNOSIS — H35372 Puckering of macula, left eye: Secondary | ICD-10-CM

## 2021-10-19 MED ORDER — BEVACIZUMAB 2.5 MG/0.1ML IZ SOSY
2.5000 mg | PREFILLED_SYRINGE | INTRAVITREAL | Status: AC | PRN
  Administered 2021-10-19: 2.5 mg via INTRAVITREAL

## 2021-10-19 NOTE — Assessment & Plan Note (Signed)
Chronic region over the fovea, still with less fluid today at over a region of geographic atrophy centrally which accounts for acuity ?

## 2021-10-19 NOTE — Assessment & Plan Note (Signed)
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

## 2021-10-19 NOTE — Assessment & Plan Note (Signed)
Minor OS no impact on acuity ?

## 2021-10-19 NOTE — Progress Notes (Signed)
? ? ?10/19/2021 ? ?  ? ?CHIEF COMPLAINT ?Patient presents for  ?Chief Complaint  ?Patient presents with  ? Macular Degeneration  ? ? ? ? ?HISTORY OF PRESENT ILLNESS: ?Kristine Rocha is a 66 y.o. female who presents to the clinic today for:  ? ?HPI   ?4 mos for dilate ou, avastin oct od. ?Pt stated no changes "I did notice one day I did notice a greater sensitivity to light. And when I closed my eyes it seems like I was seeing fractions. But then it went away." Pt noticed the change about 2-3 weeks ago. ?Hemoglobin had dropped and got two units of blood and a colonoscopy  ?Pt had a reading of 152 this morning for blood sugar. ? ? ? ?Last edited by Silvestre Moment on 10/19/2021 10:27 AM.  ?  ? ? ?Referring physician: ?Glenda Chroman, MD ?[address] ?[place],   [address] ? ?HISTORICAL INFORMATION:  ? ?Selected notes from the [facility] ?  ?   ? ?CURRENT MEDICATIONS: ?No current outpatient medications on file. (Ophthalmic Drugs)  ? ?No current facility-administered medications for this visit. (Ophthalmic Drugs)  ? ?Current Outpatient Medications (Other)  ?Medication Sig  ? atorvastatin (LIPITOR) 10 MG tablet Take 10 mg by mouth every evening.   ? cetirizine (ZYRTEC) 10 MG tablet Take 10 mg by mouth daily.  ? CHROMIUM-CINNAMON PO Take 1 capsule by mouth every evening. With Biotin  ? Cyanocobalamin (VITAMIN B-12 PO) Take 1 tablet by mouth daily.  ? diphenhydrAMINE (BENADRYL) 25 MG tablet Take 25 mg by mouth at bedtime as needed for sleep.  ? Ferrous Sulfate (IRON) 325 (65 Fe) MG TABS Take 325 mg by mouth daily. With Vit C  ? hyoscyamine (LEVSIN SL) 0.125 MG SL tablet Place 0.125 mg under the tongue 3 (three) times daily as needed for cramping.  ? insulin lispro (HUMALOG) 100 UNIT/ML KwikPen Inject 3-5 Units into the skin 3 (three) times daily. Per sliding scale  ? isoniazid (NYDRAZID) 300 MG tablet Take 1 tablet (300 mg total) by mouth daily.  ? JARDIANCE 25 MG TABS tablet Take 25 mg by mouth daily.  ? ketoconazole (NIZORAL) 2  % cream Apply 1 application topically daily. Groin area  ? levothyroxine (SYNTHROID) 88 MCG tablet Take 88 mcg by mouth daily before breakfast.  ? Melatonin 10 MG TABS Take 10 mg by mouth at bedtime.   ? metFORMIN (GLUCOPHAGE-XR) 500 MG 24 hr tablet Take 500 mg by mouth 2 (two) times daily.  ? metoprolol succinate (TOPROL-XL) 100 MG 24 hr tablet Take 100 mg by mouth daily. *Hold for low blood pressure*  ? Multiple Vitamins-Minerals (PRESERVISION AREDS 2 PO) Take 1 tablet by mouth 2 (two) times daily.  ? omeprazole (PRILOSEC) 40 MG capsule TAKE 1 CAPSULE BY MOUTH  TWICE DAILY BEFORE A MEAL  ? ONETOUCH VERIO test strip   ? OVER THE COUNTER MEDICATION Take 1 capsule by mouth daily. Fruits and Vegetables  ? pyridOXINE (VITAMIN B-6) 50 MG tablet Take 1 tablet (50 mg total) by mouth daily.  ? tiZANidine (ZANAFLEX) 4 MG tablet Take 4 mg by mouth 3 (three) times daily as needed for muscle spasms.   ? TRESIBA FLEXTOUCH 200 UNIT/ML FlexTouch Pen Inject 15 Units into the skin daily.  ? valsartan (DIOVAN) 40 MG tablet Take 40 mg by mouth daily.  ? ?No current facility-administered medications for this visit. (Other)  ? ? ? ? ?REVIEW OF SYSTEMS: ?ROS   ?Positive for: Gastrointestinal ?Negative  for: Constitutional, Neurological, Skin, Genitourinary, Musculoskeletal, HENT, Endocrine, Cardiovascular, Eyes, Respiratory, Psychiatric, Allergic/Imm, Heme/Lymph ?Last edited by Silvestre Moment on 10/19/2021 10:27 AM.  ?  ? ? ? ?ALLERGIES ?Allergies  ?Allergen Reactions  ? Amoxicillin Hives, Shortness Of Breath and Rash  ?  Did it involve swelling of the face/tongue/throat, SOB, or low BP? Yes ?Did it involve sudden or severe rash/hives, skin peeling, or any reaction on the inside of your mouth or nose? Unknown ?Did you need to seek medical attention at a hospital or doctor's office? Yes ?When did it last happen?~15-20 years ago       ?If all above answers are "NO", may proceed with cephalosporin use. ?. ?  ? Ampicillin Hives, Shortness Of Breath  and Rash  ?  Did it involve swelling of the face/tongue/throat, SOB, or low BP? Yes ?Did it involve sudden or severe rash/hives, skin peeling, or any reaction on the inside of your mouth or nose? Unknown ?Did you need to seek medical attention at a hospital or doctor's office? Yes ?When did it last happen?~15-20 years ago       ?If all above answers are ?NO?, may proceed with cephalosporin use. ?.  ? Methotrexate Derivatives Other (See Comments)  ?  hepatoxicity  ? Dust Mite Extract   ?  Sneezing, watery eyes   ? Molds & Smuts   ?  Sneezing, watery eyes   ? Nsaids   ?  Gastric Bleeds  ? Other   ?  Trees-especially pines.  ? Prevacid [Lansoprazole] Diarrhea  ? ? ?PAST MEDICAL HISTORY ?Past Medical History:  ?Diagnosis Date  ? Asthma   ? Cirrhosis ([place]) 06/2019  ? Immune to Hep A. Has completed Hep B vaccine (May 2021)  ? Diabetes ([place])   ? Essential hypertension   ? GERD (gastroesophageal reflux disease)   ? History of GI bleed   ? Hypothyroidism   ? Macular degeneration   ? wet in right, dry in left, has injections for this.  ? Microcytic anemia   ? Palpitations   ? Peptic ulcer disease   ? Postural orthostatic tachycardia syndrome   ? Mildly orthostatic results tilt table test 2005  - Dr. Caryl Comes  ? POTS (postural orthostatic tachycardia syndrome)   ? Psoriasis   ? Rheumatoid arthritis ([place])   ? 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 ?Past Surgical History:  ?Procedure Laterality Date  ? ABDOMINAL HERNIA REPAIR  15 years ago  ? umbilical with mesh  ? ABDOMINAL HYSTERECTOMY  20 years ago   ? BIOPSY  11/04/2019  ? Procedure: BIOPSY;  Surgeon: Daneil Dolin, MD;  [facility];  Service: Endoscopy;;  duodenum ?gastric  ? BIOPSY  09/27/2021  ? Procedure: BIOPSY;  Surgeon: Eloise Harman, DO;  [facility];  Service: Endoscopy;;  ? CATARACT EXTRACTION, BILATERAL Bilateral   ? CHOLECYSTECTOMY    ? COLONOSCOPY WITH ESOPHAGOGASTRODUODENOSCOPY (EGD)  03/2018  ? [facility]; indication:IDA; Normal exam.   ?  COLONOSCOPY WITH PROPOFOL N/A 09/27/2021  ? Procedure: COLONOSCOPY WITH PROPOFOL;  Surgeon: Eloise Harman, DO;  [facility];  Service: Endoscopy;  Laterality: N/A;  11:15am  ? ESOPHAGOGASTRODUODENOSCOPY (EGD) WITH PROPOFOL N/A 11/04/2019  ? Procedure: ESOPHAGOGASTRODUODENOSCOPY (EGD) WITH PROPOFOL;  Surgeon: Daneil Dolin, MD; normal esophagus s/p empiric dilation, abnormal appearing gastric mucosa s/p biopsy, abnormal duodenal mucosa s/p biopsied, no ulcer, infiltrating process, or portal gastropathy.  Duodenal biopsies benign.  Gastric biopsy with reactive gastropathy with erosions,  no H. pylori.   ? ESOPHAGOGASTRODUODENOSCOPY (EGD) WITH PROPOFOL N/A 09/27/2021  ? Procedure: ESOPHAGOGASTRODUODENOSCOPY (EGD) WITH PROPOFOL;  Surgeon: Eloise Harman, DO;  [facility];  Service: Endoscopy;  Laterality: N/A;  ? MALONEY DILATION N/A 11/04/2019  ? Procedure: MALONEY DILATION;  Surgeon: Daneil Dolin, MD;  [facility];  Service: Endoscopy;  Laterality: N/A;  ? TILT TABLE STUDY  2005  ? Caryl Comes  ? TRIGGER FINGER RELEASE Right   ? 2nd finger  ? ? ?FAMILY HISTORY ?Family History  ?Problem Relation Age of Onset  ? Heart attack Father   ?     Age 13  ? Arrhythmia Sister   ?     WPW s/p ablation- corrected [facility]  ? Alzheimer's disease Mother   ? Pneumonia Mother   ?     Aspiration  ? Heart attack Brother   ? Heart attack Maternal Grandfather   ? Colon cancer Maternal Aunt   ?     diagnosed at 62  ? Breast cancer Maternal Grandmother   ? ? ?SOCIAL HISTORY ?Social History  ? ?Tobacco Use  ? Smoking status: Never  ? Smokeless tobacco: Never  ?Vaping Use  ? Vaping Use: Never used  ?Substance Use Topics  ? Alcohol use: No  ?  Alcohol/week: 0.0 standard drinks  ? Drug use: No  ? ?  ? ?  ? ?OPHTHALMIC EXAM: ? ?Base Eye Exam   ? ? Visual Acuity (ETDRS)   ? ?   Right Left  ? Dist [place] 20/80 +2 20/20 -1  ? Dist ph [place] NI   ? ?  ?  ? ? Tonometry (Tonopen, 10:34 AM)   ? ?   Right Left  ? Pressure 17  13  ? ?  ?  ? ? Pupils   ? ?   Pupils APD  ? Right PERRL None  ? Left PERRL None  ? ?  ?  ? ? Visual Fields   ? ?   Left Right  ?  Full Full  ? ?  ?  ? ? Extraocular Movement   ? ?   Right Left  ?  Fu

## 2021-10-19 NOTE — Assessment & Plan Note (Signed)
Moderate intermediate AMD, no sign of CNVM ?

## 2021-10-19 NOTE — Assessment & Plan Note (Signed)
Stable and controlled currently at 38-monthinterval on Avastin for chronic suppression, repeat Avastin today and reexamination next in 19 weeks to monitor for stability ?

## 2021-10-21 DIAGNOSIS — E1165 Type 2 diabetes mellitus with hyperglycemia: Secondary | ICD-10-CM | POA: Diagnosis not present

## 2021-10-22 DIAGNOSIS — Z794 Long term (current) use of insulin: Secondary | ICD-10-CM | POA: Diagnosis not present

## 2021-10-22 DIAGNOSIS — E1165 Type 2 diabetes mellitus with hyperglycemia: Secondary | ICD-10-CM | POA: Diagnosis not present

## 2021-11-08 ENCOUNTER — Encounter: Payer: Self-pay | Admitting: Internal Medicine

## 2021-11-08 ENCOUNTER — Telehealth: Payer: Self-pay | Admitting: Internal Medicine

## 2021-11-08 NOTE — Telephone Encounter (Signed)
ERROR

## 2021-11-08 NOTE — Telephone Encounter (Signed)
RECALL FOR ULTRASOUND 

## 2021-11-08 NOTE — Telephone Encounter (Signed)
Recall mailed 

## 2021-11-11 ENCOUNTER — Other Ambulatory Visit: Payer: Self-pay

## 2021-11-11 ENCOUNTER — Encounter: Payer: Self-pay | Admitting: Infectious Disease

## 2021-11-11 ENCOUNTER — Ambulatory Visit (INDEPENDENT_AMBULATORY_CARE_PROVIDER_SITE_OTHER): Payer: Medicare Other | Admitting: Infectious Disease

## 2021-11-11 VITALS — BP 119/81 | HR 73 | Temp 97.9°F | Ht 59.0 in | Wt 185.0 lb

## 2021-11-11 DIAGNOSIS — K7460 Unspecified cirrhosis of liver: Secondary | ICD-10-CM

## 2021-11-11 DIAGNOSIS — L4050 Arthropathic psoriasis, unspecified: Secondary | ICD-10-CM

## 2021-11-11 DIAGNOSIS — H353114 Nonexudative age-related macular degeneration, right eye, advanced atrophic with subfoveal involvement: Secondary | ICD-10-CM | POA: Diagnosis not present

## 2021-11-11 DIAGNOSIS — Z227 Latent tuberculosis: Secondary | ICD-10-CM

## 2021-11-11 DIAGNOSIS — L409 Psoriasis, unspecified: Secondary | ICD-10-CM | POA: Diagnosis not present

## 2021-11-11 HISTORY — DX: Arthropathic psoriasis, unspecified: L40.50

## 2021-11-11 NOTE — Progress Notes (Signed)
? ?Subjective:  ? ?Chief complaint: Follow-up for latent tuberculosis ? ? Patient ID: Kristine Rocha, female    DOB: Apr 29, 1956, 66 y.o.   MRN: [id number] ? ?HPI ? 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  She has history of an uncle on her father side having pulmonary tuberculosis in the 48s.  She was exposed to him at that time and later in the 1980s had a positive skin test x2 to tuberculosis.  She was never treated for latent tuberculosis. ? ?She follows with Dr. Lenna Gilford for her psoriasis and psoriatic arthritis.  ? ?Patient iwas  currently not on any active drugs for psoriasis.  Ideally her rheumatologist would like to be able to use more potent immunosuppressive therapy including biologic therapy possibly. ? ?However certainly I agree that her latent TB needs to be treated first.  She has had a chest x-ray that is without evidence of cardiopulmonary disease and she does not have any symptoms to suggest active tuberculosis. ? ?We considered different options and she would prefer to go with isoniazid to minimize risk of hepatotoxicity given her pre-existing cirrhosis of the liver that developed while she was getting methotrexate therapy. ? ?She has been tolerating the [place] and taking this religiously with pyridoxine. No side effects noted. ? ?She did have some mild elevation in transaminases when we last checked her CMP she does have comorbid NASH.  She recently had an elevation in her alpha-fetoprotein and had an MRI of the liver which showed no evidence of hepatocellular carcinoma but a cyst and known cirrhosis. ? ?She is now through 9 months of INH and can stop the medication ? ?She has had IDAnemia of unclear cause and she says negative GI workup ? ? ? ? ?Past Medical History:  ?Diagnosis Date  ? Asthma   ? Cirrhosis ([place]) 06/2019  ? Immune to Hep A. Has  completed Hep B vaccine (May 2021)  ? Diabetes ([place])   ? Essential hypertension   ? GERD (gastroesophageal reflux disease)   ? History of GI bleed   ? Hypothyroidism   ? Macular degeneration   ? wet in right, dry in left, has injections for this.  ? Microcytic anemia   ? Palpitations   ? Peptic ulcer disease   ? Postural orthostatic tachycardia syndrome   ? Mildly orthostatic results tilt table test 2005  - Dr. Caryl Comes  ? POTS (postural orthostatic tachycardia syndrome)   ? Psoriasis   ? Rheumatoid arthritis ([place])   ? 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 ? ?Past Surgical History:  ?Procedure Laterality Date  ? ABDOMINAL HERNIA REPAIR  15 years ago  ? umbilical with mesh  ? ABDOMINAL HYSTERECTOMY  20 years ago   ? BIOPSY  11/04/2019  ? Procedure: BIOPSY;  Surgeon: Daneil Dolin, MD;  [facility];  Service: Endoscopy;;  duodenum ?gastric  ? BIOPSY  09/27/2021  ? Procedure: BIOPSY;  Surgeon: Eloise Harman, DO;  [facility];  Service: Endoscopy;;  ? CATARACT EXTRACTION, BILATERAL Bilateral   ? CHOLECYSTECTOMY    ? COLONOSCOPY WITH ESOPHAGOGASTRODUODENOSCOPY (EGD)  03/2018  ? [facility]; indication:IDA; Normal exam.   ? COLONOSCOPY WITH PROPOFOL N/A 09/27/2021  ? Procedure: COLONOSCOPY WITH PROPOFOL;  Surgeon: Eloise Harman, DO;  [facility];  Service: Endoscopy;  Laterality: N/A;  11:15am  ?  ESOPHAGOGASTRODUODENOSCOPY (EGD) WITH PROPOFOL N/A 11/04/2019  ? Procedure: ESOPHAGOGASTRODUODENOSCOPY (EGD) WITH PROPOFOL;  Surgeon: Daneil Dolin, MD; normal esophagus s/p empiric dilation, abnormal appearing gastric mucosa s/p biopsy, abnormal duodenal mucosa s/p biopsied, no ulcer, infiltrating process, or portal gastropathy.  Duodenal biopsies benign.  Gastric biopsy with reactive gastropathy with erosions, no H. pylori.   ? ESOPHAGOGASTRODUODENOSCOPY (EGD) WITH PROPOFOL N/A 09/27/2021  ? Procedure: ESOPHAGOGASTRODUODENOSCOPY (EGD) WITH PROPOFOL;  Surgeon: Eloise Harman, DO;  [facility];  Service: Endoscopy;  Laterality: N/A;  ? MALONEY DILATION N/A 11/04/2019  ? Procedure: MALONEY DILATION;  Surgeon: Daneil Dolin, MD;  [facility];  Service: Endoscopy;  Laterality: N/A;  ? TILT TABLE STUDY  2005  ? Caryl Comes  ? TRIGGER FINGER RELEASE Right   ? 2nd finger  ? ? ?Family History  ?Problem Relation Age of Onset  ? Heart attack Father   ?     Age 25  ? Arrhythmia Sister   ?     WPW s/p ablation- corrected at [facility]  ? Alzheimer's disease Mother   ? Pneumonia Mother   ?     Aspiration  ? Heart attack Brother   ? Heart attack Maternal Grandfather   ? Colon cancer Maternal Aunt   ?     diagnosed at 68  ? Breast cancer Maternal Grandmother   ? ? ?  ?Social History  ? ?Socioeconomic History  ? Marital status: Married  ?  Spouse name: Not on file  ? Number of children: Not on file  ? Years of education: Not on file  ? Highest education level: Not on file  ?Occupational History  ? Not on file  ?Tobacco Use  ? Smoking status: Never  ? Smokeless tobacco: Never  ?Vaping Use  ? Vaping Use: Never used  ?Substance and Sexual Activity  ? Alcohol use: No  ?  Alcohol/week: 0.0 standard drinks  ? Drug use: No  ? Sexual activity: Yes  ?Other Topics Concern  ? Not on file  ?Social History Narrative  ? Not on file  ? ?Social Determinants of Health  ? ?Financial Resource Strain: Not on file  ?Food Insecurity: Not on file  ?Transportation Needs: Not on file  ?Physical Activity: Not on file  ?Stress: Not on file  ?Social Connections: Not on file  ? ? ?Allergies  ?Allergen Reactions  ? Amoxicillin Hives, Shortness Of Breath and Rash  ?  Did it involve swelling of the face/tongue/throat, SOB, or low BP? Yes ?Did it involve sudden or severe rash/hives, skin peeling, or any reaction on the inside of your mouth or nose? Unknown ?Did you need to seek medical attention at a hospital or doctor's office? Yes ?When did it last happen?~15-20 years ago       ?If all above answers are "NO", may proceed with  cephalosporin use. ?. ?  ? Ampicillin Hives, Shortness Of Breath and Rash  ?  Did it involve swelling of the face/tongue/throat, SOB, or low BP? Yes ?Did it involve sudden or severe rash/hives, skin peeling, or any reaction on the inside of your mouth or nose? Unknown ?Did you need to seek medical attention at a hospital or doctor's office? Yes ?When did it last happen?~15-20 years ago       ?If all above answers are ?NO?, may proceed with cephalosporin use. ?.  ? Methotrexate Derivatives Other (See Comments)  ?  hepatoxicity  ? Dust Mite Extract   ?  Sneezing,  watery eyes   ? Molds & Smuts   ?  Sneezing, watery eyes   ? Nsaids   ?  Gastric Bleeds  ? Other   ?  Trees-especially pines.  ? Prevacid [Lansoprazole] Diarrhea  ? ? ? ?Current Outpatient Medications:  ?  atorvastatin (LIPITOR) 10 MG tablet, Take 10 mg by mouth every evening. , Disp: , Rfl:  ?  cetirizine (ZYRTEC) 10 MG tablet, Take 10 mg by mouth daily., Disp: , Rfl:  ?  CHROMIUM-CINNAMON PO, Take 1 capsule by mouth every evening. With Biotin, Disp: , Rfl:  ?  Cyanocobalamin (VITAMIN B-12 PO), Take 1 tablet by mouth daily., Disp: , Rfl:  ?  diphenhydrAMINE (BENADRYL) 25 MG tablet, Take 25 mg by mouth at bedtime as needed for sleep., Disp: , Rfl:  ?  Ferrous Sulfate (IRON) 325 (65 Fe) MG TABS, Take 325 mg by mouth daily. With Vit C, Disp: , Rfl:  ?  hyoscyamine (LEVSIN SL) 0.125 MG SL tablet, Place 0.125 mg under the tongue 3 (three) times daily as needed for cramping., Disp: , Rfl:  ?  insulin lispro (HUMALOG) 100 UNIT/ML KwikPen, Inject 3-5 Units into the skin 3 (three) times daily. Per sliding scale, Disp: , Rfl:  ?  isoniazid (NYDRAZID) 300 MG tablet, Take 1 tablet (300 mg total) by mouth daily., Disp: 30 tablet, Rfl: 8 ?  JARDIANCE 25 MG TABS tablet, Take 25 mg by mouth daily., Disp: , Rfl:  ?  ketoconazole (NIZORAL) 2 % cream, Apply 1 application topically daily. Groin area, Disp: , Rfl:  ?  levothyroxine (SYNTHROID) 88 MCG tablet, Take 88 mcg by mouth  daily before breakfast., Disp: , Rfl:  ?  Melatonin 10 MG TABS, Take 10 mg by mouth at bedtime. , Disp: , Rfl:  ?  metFORMIN (GLUCOPHAGE-XR) 500 MG 24 hr tablet, Take 500 mg by mouth 2 (two) times daily., Disp:

## 2021-11-21 DIAGNOSIS — E1165 Type 2 diabetes mellitus with hyperglycemia: Secondary | ICD-10-CM | POA: Diagnosis not present

## 2021-11-22 DIAGNOSIS — Z794 Long term (current) use of insulin: Secondary | ICD-10-CM | POA: Diagnosis not present

## 2021-11-22 DIAGNOSIS — E1165 Type 2 diabetes mellitus with hyperglycemia: Secondary | ICD-10-CM | POA: Diagnosis not present

## 2021-11-23 ENCOUNTER — Telehealth: Payer: Self-pay | Admitting: Internal Medicine

## 2021-11-23 DIAGNOSIS — Z8719 Personal history of other diseases of the digestive system: Secondary | ICD-10-CM

## 2021-11-23 NOTE — Telephone Encounter (Signed)
Pt is aware of her OV next week with RMR and also wanted to schedule her U/S. [phone] ?

## 2021-11-23 NOTE — Telephone Encounter (Signed)
Called pt, LMOVM with appt details ?

## 2021-11-24 DIAGNOSIS — E7800 Pure hypercholesterolemia, unspecified: Secondary | ICD-10-CM | POA: Diagnosis not present

## 2021-11-24 DIAGNOSIS — Z789 Other specified health status: Secondary | ICD-10-CM | POA: Diagnosis not present

## 2021-11-24 DIAGNOSIS — Z7189 Other specified counseling: Secondary | ICD-10-CM | POA: Diagnosis not present

## 2021-11-24 DIAGNOSIS — Z299 Encounter for prophylactic measures, unspecified: Secondary | ICD-10-CM | POA: Diagnosis not present

## 2021-11-24 DIAGNOSIS — R5383 Other fatigue: Secondary | ICD-10-CM | POA: Diagnosis not present

## 2021-11-24 DIAGNOSIS — Z79899 Other long term (current) drug therapy: Secondary | ICD-10-CM | POA: Diagnosis not present

## 2021-11-24 DIAGNOSIS — Z0000 Encounter for general adult medical examination without abnormal findings: Secondary | ICD-10-CM | POA: Diagnosis not present

## 2021-11-30 ENCOUNTER — Ambulatory Visit (HOSPITAL_COMMUNITY)
Admission: RE | Admit: 2021-11-30 | Discharge: 2021-11-30 | Disposition: A | Discharge status: Home / Self Care | Payer: Medicare Other | Source: Ambulatory Visit | Attending: Gastroenterology | Admitting: Gastroenterology

## 2021-11-30 ENCOUNTER — Ambulatory Visit: Payer: Medicare Other | Admitting: Internal Medicine

## 2021-11-30 ENCOUNTER — Encounter: Payer: Self-pay | Admitting: Internal Medicine

## 2021-11-30 ENCOUNTER — Other Ambulatory Visit: Payer: Self-pay

## 2021-11-30 VITALS — BP 128/72 | HR 67 | Temp 98.5°F | Ht 59.0 in | Wt 182.0 lb

## 2021-11-30 DIAGNOSIS — K219 Gastro-esophageal reflux disease without esophagitis: Secondary | ICD-10-CM | POA: Diagnosis not present

## 2021-11-30 DIAGNOSIS — Z8719 Personal history of other diseases of the digestive system: Secondary | ICD-10-CM | POA: Insufficient documentation

## 2021-11-30 DIAGNOSIS — D509 Iron deficiency anemia, unspecified: Secondary | ICD-10-CM

## 2021-11-30 DIAGNOSIS — R7989 Other specified abnormal findings of blood chemistry: Secondary | ICD-10-CM

## 2021-11-30 DIAGNOSIS — K7460 Unspecified cirrhosis of liver: Secondary | ICD-10-CM | POA: Diagnosis not present

## 2021-11-30 DIAGNOSIS — K7469 Other cirrhosis of liver: Secondary | ICD-10-CM | POA: Diagnosis not present

## 2021-11-30 IMAGING — US US ABDOMEN LIMITED
1 series · 14 of 25 positions shown · non-contrast
Comparison: MRI abdomen [DATE]

CLINICAL DATA: History of cirrhosis.

EXAM:
ULTRASOUND ABDOMEN LIMITED RIGHT UPPER QUADRANT

[Series 1: us abdomen limited ruq (liver/gb) · 14 of 55 slices shown]
[im 1/55]
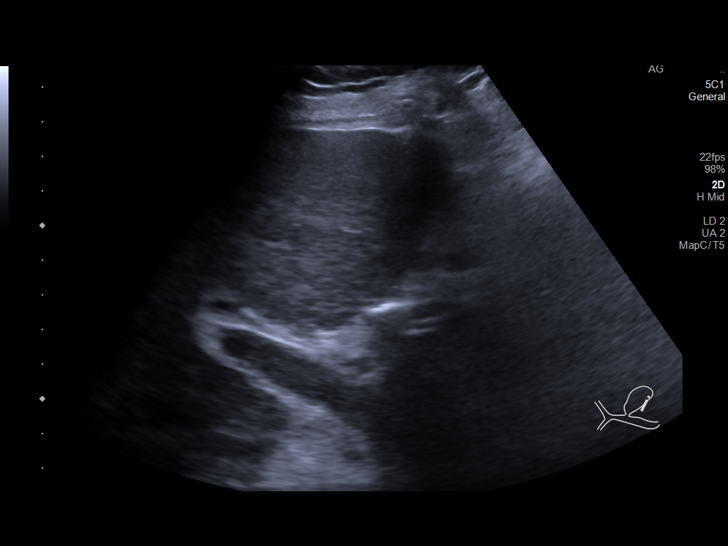
[im 5/55]
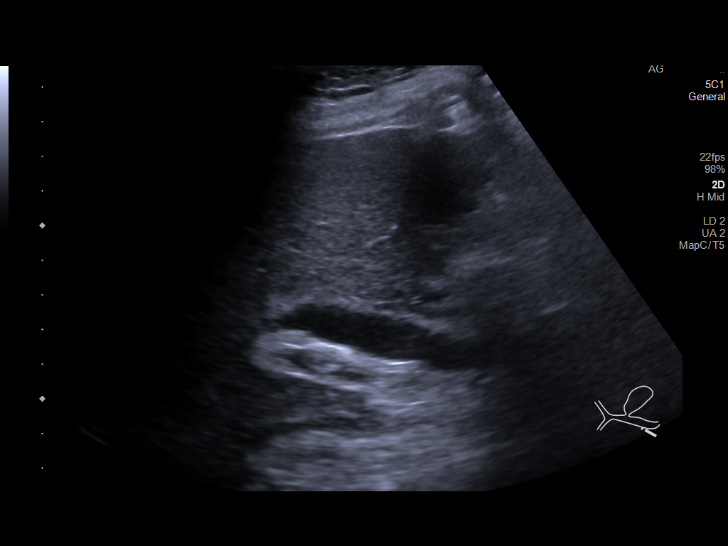
[im 10/55]
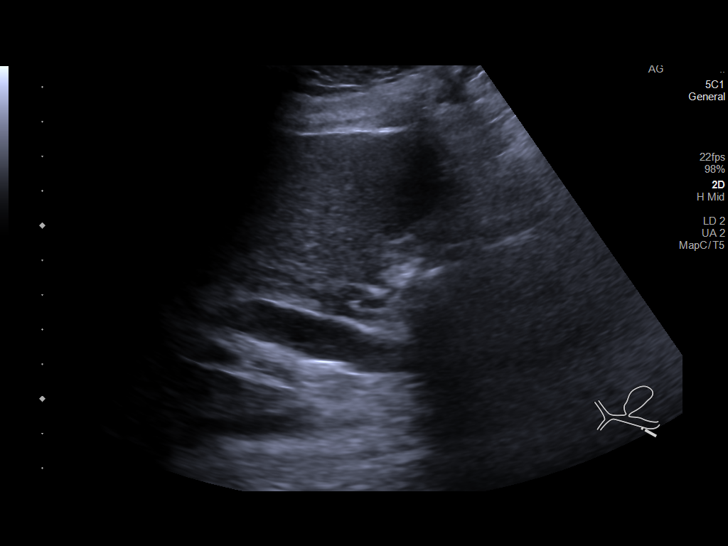
[im 14/55]
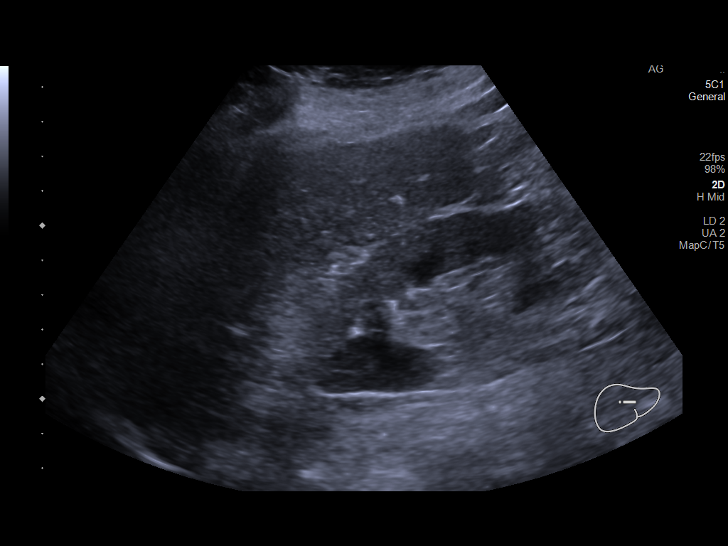
[im 19/55]
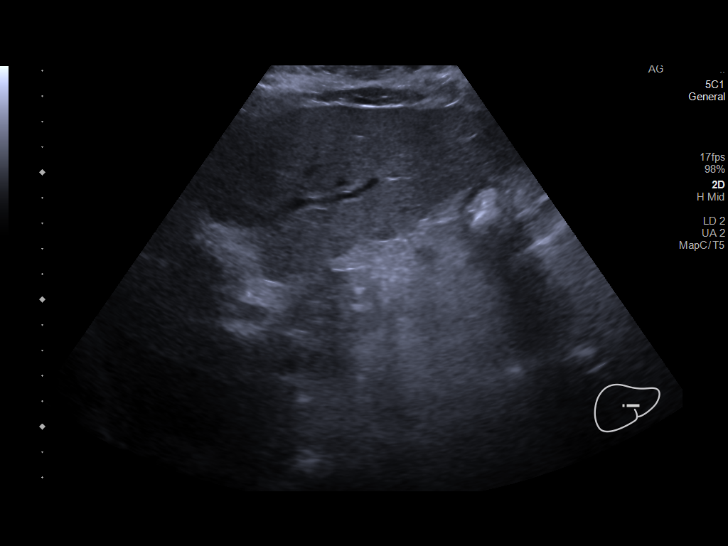
[im 21/55]
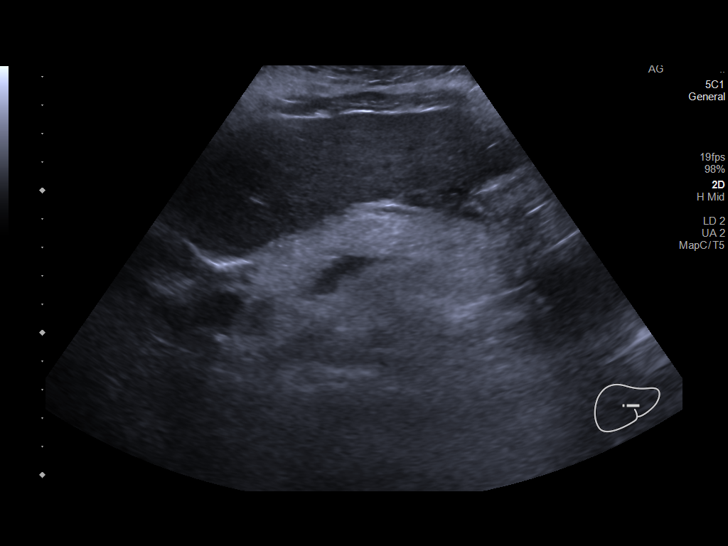
[im 25/55]
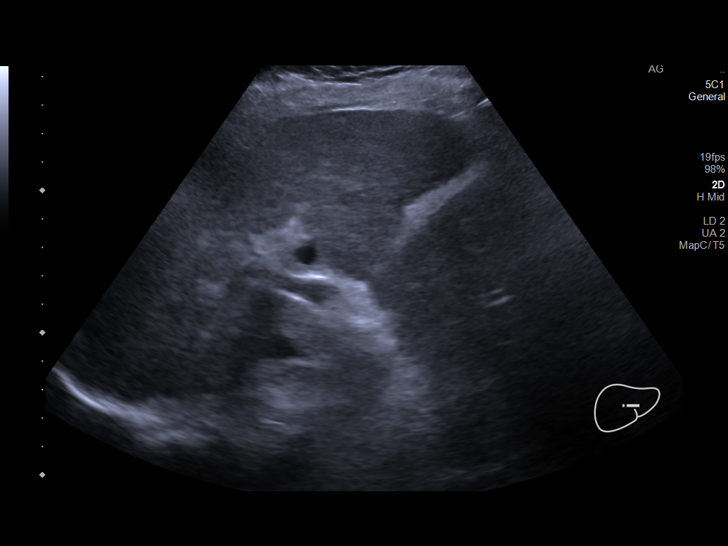
[im 30/55]
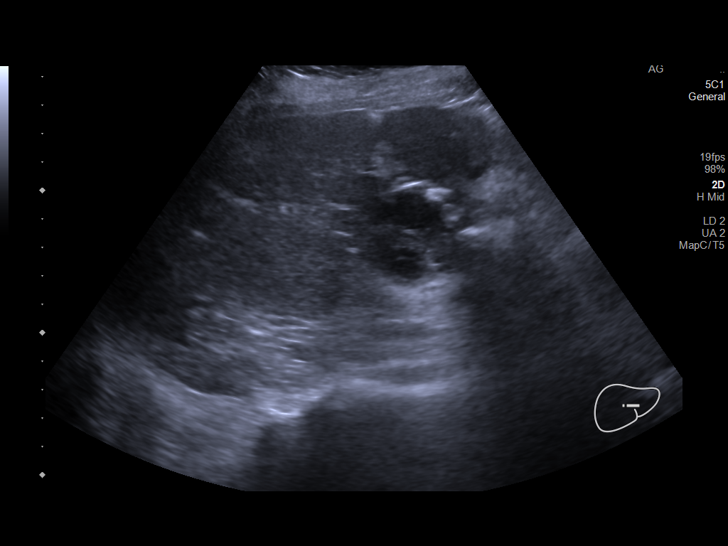
[im 34/55]
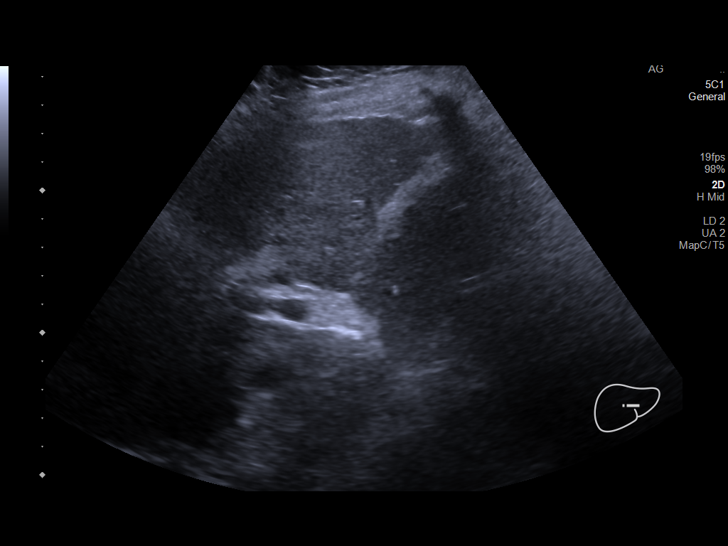
[im 37/55]
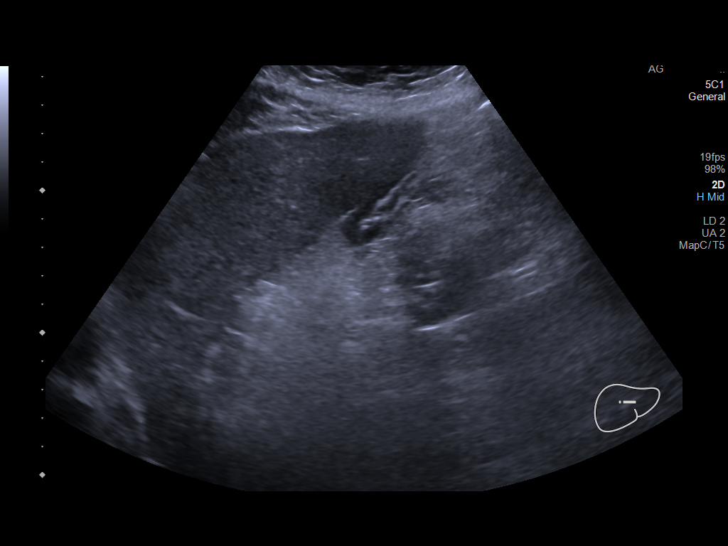
[im 41/55]
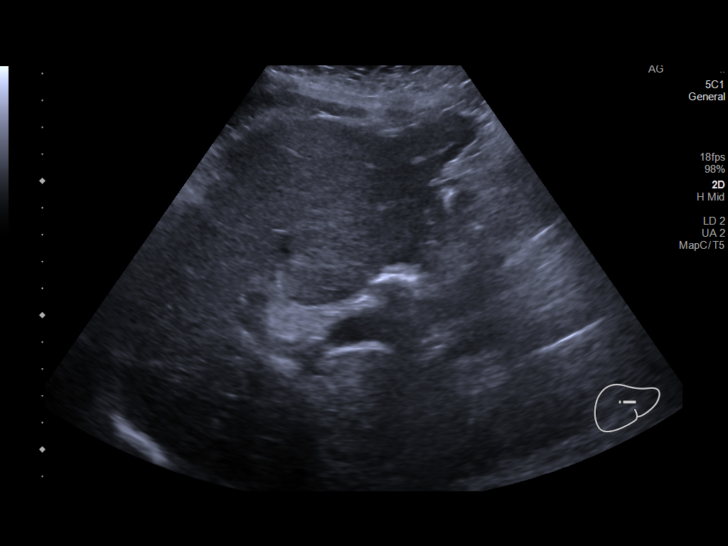
[im 46/55]
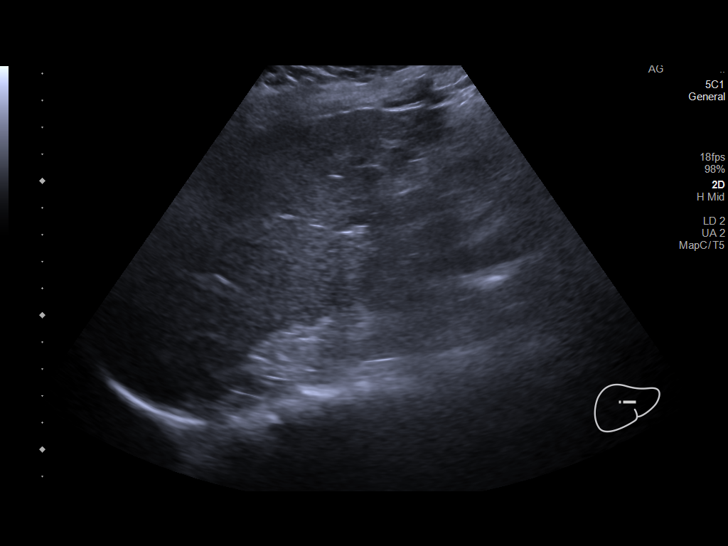
[im 50/55]
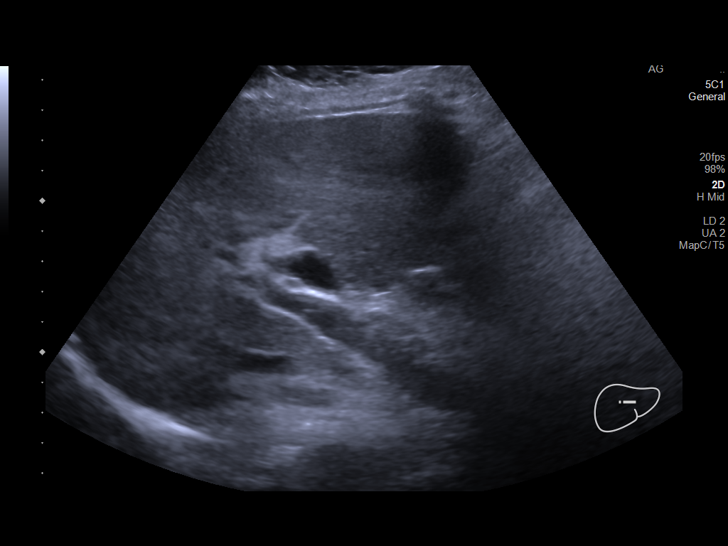
[im 55/55]
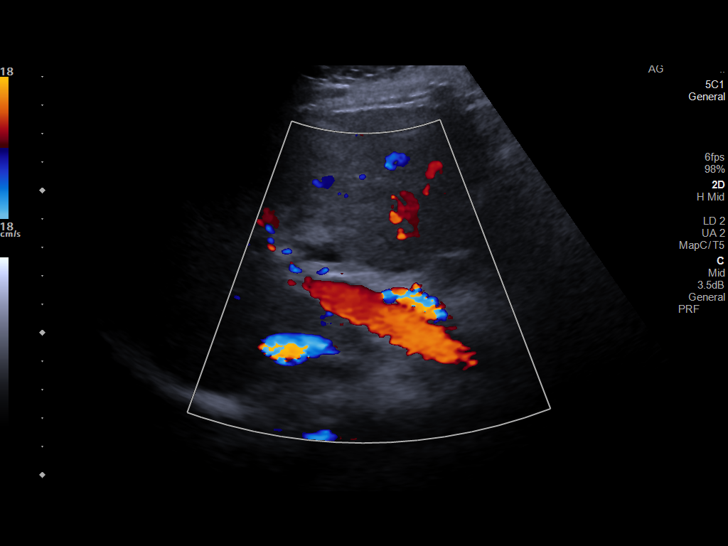

[14 of 25 positions shown; findings below may reference images not displayed]

FINDINGS: Gallbladder:

Surgically absent

Common bile duct:

Diameter: 11 mm

Liver:

Morphologic changes compatible with cirrhosis. No focal lesion.
Portal vein is patent on color Doppler imaging with normal direction
of blood flow towards the liver.

Other: None.
IMPRESSION: Morphologic changes compatible with cirrhosis.  No focal lesion.

Prior cholecystectomy. Common bile duct is prominent likely post
cholecystectomy state.

## 2021-11-30 NOTE — Progress Notes (Signed)
? ? ?Primary Care Physician:  Glenda Chroman, MD ?Primary Gastroenterologist:  Dr. Gala Romney ? ?Pre-Procedure History & Physical: ?HPI:  Kristine Rocha is a 66 y.o. female here for follow-up from recent endoscopic evaluation of iron deficiency anemia.  Mild non-H. pylori gastritis on EGD.  Colonoscopy incomplete because of a poor prep.  She has well-established iron deficiency anemia without obvious GI bleeding.  She is in need of a colonoscopy in the setting of a good prep to wrap up that phase of her GI evaluation. ?Reflux controlled on omeprazole 40 mg twice daily. ?Rising alpha-fetoprotein noted over the past year. ? ?Past Medical History:  ?Diagnosis Date  ? Asthma   ? Cirrhosis ([place]) 06/2019  ? Immune to Hep A. Has completed Hep B vaccine (May 2021)  ? Diabetes ([place])   ? Essential hypertension   ? GERD (gastroesophageal reflux disease)   ? History of GI bleed   ? Hypothyroidism   ? Macular degeneration   ? wet in right, dry in left, has injections for this.  ? Microcytic anemia   ? Palpitations   ? Peptic ulcer disease   ? Postural orthostatic tachycardia syndrome   ? Mildly orthostatic results tilt table test 2005  - Dr. Caryl Comes  ? POTS (postural orthostatic tachycardia syndrome)   ? Psoriasis   ? Psoriatic arthritis ([place]) 11/11/2021  ? Rheumatoid arthritis ([place])   ? 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 ? ?Past Surgical History:  ?Procedure Laterality Date  ? ABDOMINAL HERNIA REPAIR  15 years ago  ? umbilical with mesh  ? ABDOMINAL HYSTERECTOMY  20 years ago   ? BIOPSY  11/04/2019  ? Procedure: BIOPSY;  Surgeon: Daneil Dolin, MD;  [facility];  Service: Endoscopy;;  duodenum ?gastric  ? BIOPSY  09/27/2021  ? Procedure: BIOPSY;  Surgeon: Eloise Harman, DO;  [facility];  Service: Endoscopy;;  ? CATARACT EXTRACTION, BILATERAL Bilateral   ? CHOLECYSTECTOMY    ? COLONOSCOPY WITH ESOPHAGOGASTRODUODENOSCOPY (EGD)  03/2018  ? [facility]; indication:IDA; Normal exam.   ? COLONOSCOPY WITH PROPOFOL N/A  09/27/2021  ? Procedure: COLONOSCOPY WITH PROPOFOL;  Surgeon: Eloise Harman, DO;  [facility];  Service: Endoscopy;  Laterality: N/A;  11:15am  ? ESOPHAGOGASTRODUODENOSCOPY (EGD) WITH PROPOFOL N/A 11/04/2019  ? Procedure: ESOPHAGOGASTRODUODENOSCOPY (EGD) WITH PROPOFOL;  Surgeon: Daneil Dolin, MD; normal esophagus s/p empiric dilation, abnormal appearing gastric mucosa s/p biopsy, abnormal duodenal mucosa s/p biopsied, no ulcer, infiltrating process, or portal gastropathy.  Duodenal biopsies benign.  Gastric biopsy with reactive gastropathy with erosions, no H. pylori.   ? ESOPHAGOGASTRODUODENOSCOPY (EGD) WITH PROPOFOL N/A 09/27/2021  ? Procedure: ESOPHAGOGASTRODUODENOSCOPY (EGD) WITH PROPOFOL;  Surgeon: Eloise Harman, DO;  [facility];  Service: Endoscopy;  Laterality: N/A;  ? MALONEY DILATION N/A 11/04/2019  ? Procedure: MALONEY DILATION;  Surgeon: Daneil Dolin, MD;  [facility];  Service: Endoscopy;  Laterality: N/A;  ? TILT TABLE STUDY  2005  ? Caryl Comes  ? TRIGGER FINGER RELEASE Right   ? 2nd finger  ? ? ?Prior to Admission medications   ?Medication Sig Start Date End Date Taking? Authorizing Provider  ?atorvastatin (LIPITOR) 10 MG tablet Take 10 mg by mouth every evening.    Yes [provider]  ?Ferrous Sulfate (IRON) 325 (65 Fe) MG TABS Take 325 mg by mouth daily. With Vit C   Yes [provider]  ?hyoscyamine (LEVSIN SL) 0.125 MG SL tablet Place  0.125 mg under the tongue 3 (three) times daily as needed for cramping. 01/13/20  Yes [provider]  ?insulin lispro (HUMALOG) 100 UNIT/ML KwikPen Inject 3-5 Units into the skin 3 (three) times daily. Per sliding scale   Yes [provider]  ?JARDIANCE 25 MG TABS tablet Take 25 mg by mouth daily. 09/14/21  Yes [provider]  ?ketoconazole (NIZORAL) 2 % cream Apply 1 application topically daily. Groin area   Yes [provider]  ?levothyroxine (SYNTHROID) 88 MCG tablet Take  88 mcg by mouth daily before breakfast. 04/14/19  Yes [provider]  ?Melatonin 10 MG TABS Take 10 mg by mouth at bedtime.    Yes [provider]  ?metFORMIN (GLUCOPHAGE-XR) 500 MG 24 hr tablet Take 500 mg by mouth 2 (two) times daily. 03/04/17  Yes [provider]  ?metoprolol succinate (TOPROL-XL) 100 MG 24 hr tablet Take 100 mg by mouth daily. *Hold for low blood pressure*   Yes [provider]  ?Multiple Vitamins-Minerals (PRESERVISION AREDS 2 PO) Take 1 tablet by mouth 2 (two) times daily.   Yes [provider]  ?omeprazole (PRILOSEC) 40 MG capsule TAKE 1 CAPSULE BY MOUTH  TWICE DAILY BEFORE A MEAL 09/13/21  Yes Sherron Monday, NP  ?ONETOUCH VERIO test strip  12/06/16  Yes [provider]  ?OVER THE COUNTER MEDICATION Take 1 capsule by mouth daily. Fruits and Vegetables   Yes [provider]  ?tiZANidine (ZANAFLEX) 4 MG tablet Take 4 mg by mouth 3 (three) times daily as needed for muscle spasms.  06/14/19  Yes [provider]  ?TRESIBA FLEXTOUCH 200 UNIT/ML FlexTouch Pen Inject 15 Units into the skin daily. 09/14/21  Yes [provider]  ?valsartan (DIOVAN) 40 MG tablet Take 40 mg by mouth daily. 09/04/21  Yes [provider]  ? ? ?Allergies as of 11/30/2021 - Review Complete 11/30/2021  ?Allergen Reaction Noted  ? Amoxicillin Hives, Shortness Of Breath, and Rash 11/19/2014  ? Ampicillin Hives, Shortness Of Breath, and Rash 11/19/2014  ? Methotrexate derivatives Other (See Comments) 02/01/2021  ? Dust mite extract  10/28/2020  ? Molds & smuts  10/28/2020  ? Nsaids  09/17/2021  ? Other  11/19/2014  ? Prevacid [lansoprazole] Diarrhea 06/24/2019  ? ? ?Family History  ?Problem Relation Age of Onset  ? Heart attack Father   ?     Age 60  ? Arrhythmia Sister   ?     WPW s/p ablation- corrected at [facility]  ? Alzheimer's disease Mother   ? Pneumonia Mother   ?     Aspiration  ? Heart attack Brother   ? Heart attack Maternal  Grandfather   ? Colon cancer Maternal Aunt   ?     diagnosed at 87  ? Breast cancer Maternal Grandmother   ? ? ?Social History  ? ?Socioeconomic History  ? Marital status: Married  ?  Spouse name: Not on file  ? Number of children: Not on file  ? Years of education: Not on file  ? Highest education level: Not on file  ?Occupational History  ? Not on file  ?Tobacco Use  ? Smoking status: Never  ? Smokeless tobacco: Never  ?Vaping Use  ? Vaping Use: Never used  ?Substance and Sexual Activity  ? Alcohol use: No  ?  Alcohol/week: 0.0 standard drinks  ? Drug use: No  ? Sexual activity: Yes  ?Other Topics Concern  ? Not on file  ?Social History Narrative  ?  Not on file  ? ?Social Determinants of Health  ? ?Financial Resource Strain: Not on file  ?Food Insecurity: Not on file  ?Transportation Needs: Not on file  ?Physical Activity: Not on file  ?Stress: Not on file  ?Social Connections: Not on file  ?Intimate Partner Violence: Not on file  ? ? ?Review of Systems: ?See HPI, otherwise negative ROS ? ?Physical Exam: ?BP 128/72 (BP Location: Left Arm, Patient Position: Sitting, Cuff Size: Normal)   Pulse 67   Temp 98.5 ?F (36.9 ?C) (Oral)   Ht '4\' 11"'$  (1.499 m)   Wt 182 lb (82.6 kg)   SpO2 98%   BMI 36.76 kg/m?  ?General:   Alert,  Well-developed, well-nourished, pleasant and cooperative in NAD ?SNeck:  Supple; no masses or thyromegaly. No significant cervical adenopathy. ?Lungs:  Clear throughout to auscultation.   No wheezes, crackles, or rhonchi. No acute distress. ?Heart:  Regular rate and rhythm; no murmurs, clicks, rubs,  or gallops. ?Abdomen: Non-distended, normal bowel sounds.  Soft and nontender without appreciable mass or hepatosplenomegaly.  ?Pulses:  Normal pulses noted. ?Extremities:  Without clubbing or edema. ? 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  No evidence of portal hypertension on recent EGD.  Normal albumin and platelet count.   ?She does  have iron deficiency anemia.  She needs a completed colonoscopy. ?Rising alpha-fetoprotein of note. ? ? ?Recommendations: ? ?Continue omeprazole 40 mg twice a day before meals. ? ?We will schedule a diagnostic col

## 2021-11-30 NOTE — Patient Instructions (Signed)
It was good seeing you again today! ? ?Continue omeprazole 40 mg twice a day before meals. ? ?We will schedule a diagnostic colonoscopy (iron deficiency anemia) ASA 2  ? ?Do not take Jardiance the day before day of the colonoscopy do not take metformin the day before day of the colonoscopy decrease Tresiba to 5 units the day before the procedure only ? ?Extra colon preparation will be provided for this colonoscopy.  Linzess 145 daily times the 3 days prior to the procedure.        ? ?Repeat alpha-fetoprotein blood level today. ? ?Further recommendations to follow.                                                                                                                                           ?

## 2021-12-01 ENCOUNTER — Other Ambulatory Visit: Payer: Self-pay | Admitting: *Deleted

## 2021-12-01 DIAGNOSIS — R772 Abnormality of alphafetoprotein: Secondary | ICD-10-CM

## 2021-12-01 LAB — AFP TUMOR MARKER: AFP, Serum, Tumor Marker: 39.6 ng/mL — ABNORMAL HIGH (ref 0.0–9.2)

## 2021-12-03 ENCOUNTER — Telehealth: Payer: Self-pay | Admitting: *Deleted

## 2021-12-03 NOTE — Telephone Encounter (Signed)
LMOVM to call back to schedule TCS with Dr. Gala Romney, ASA 2. See last OV for special instructions ?

## 2021-12-08 DIAGNOSIS — E1165 Type 2 diabetes mellitus with hyperglycemia: Secondary | ICD-10-CM | POA: Diagnosis not present

## 2021-12-08 DIAGNOSIS — I10 Essential (primary) hypertension: Secondary | ICD-10-CM | POA: Diagnosis not present

## 2021-12-08 DIAGNOSIS — Z713 Dietary counseling and surveillance: Secondary | ICD-10-CM | POA: Diagnosis not present

## 2021-12-08 DIAGNOSIS — Z299 Encounter for prophylactic measures, unspecified: Secondary | ICD-10-CM | POA: Diagnosis not present

## 2021-12-10 DIAGNOSIS — Z794 Long term (current) use of insulin: Secondary | ICD-10-CM | POA: Diagnosis not present

## 2021-12-10 DIAGNOSIS — E1165 Type 2 diabetes mellitus with hyperglycemia: Secondary | ICD-10-CM | POA: Diagnosis not present

## 2021-12-13 NOTE — Telephone Encounter (Signed)
Pt called office, she wants wait until she has CT done in June before scheduling TCS.

## 2021-12-21 DIAGNOSIS — E1165 Type 2 diabetes mellitus with hyperglycemia: Secondary | ICD-10-CM | POA: Diagnosis not present

## 2021-12-24 ENCOUNTER — Other Ambulatory Visit: Payer: Self-pay | Admitting: Infectious Disease

## 2021-12-24 DIAGNOSIS — Z227 Latent tuberculosis: Secondary | ICD-10-CM

## 2021-12-25 ENCOUNTER — Other Ambulatory Visit: Payer: Self-pay | Admitting: Infectious Disease

## 2021-12-25 DIAGNOSIS — Z227 Latent tuberculosis: Secondary | ICD-10-CM

## 2022-01-10 DIAGNOSIS — E1165 Type 2 diabetes mellitus with hyperglycemia: Secondary | ICD-10-CM | POA: Diagnosis not present

## 2022-01-10 DIAGNOSIS — Z794 Long term (current) use of insulin: Secondary | ICD-10-CM | POA: Diagnosis not present

## 2022-01-12 ENCOUNTER — Encounter (HOSPITAL_COMMUNITY): Payer: Self-pay

## 2022-01-12 ENCOUNTER — Ambulatory Visit (HOSPITAL_COMMUNITY)
Admission: RE | Admit: 2022-01-12 | Discharge: 2022-01-12 | Disposition: A | Discharge status: Home / Self Care | Payer: Medicare Other | Source: Ambulatory Visit | Attending: Internal Medicine | Admitting: Internal Medicine

## 2022-01-12 DIAGNOSIS — I10 Essential (primary) hypertension: Secondary | ICD-10-CM | POA: Insufficient documentation

## 2022-01-12 DIAGNOSIS — N289 Disorder of kidney and ureter, unspecified: Secondary | ICD-10-CM | POA: Diagnosis not present

## 2022-01-12 DIAGNOSIS — Z9889 Other specified postprocedural states: Secondary | ICD-10-CM | POA: Diagnosis not present

## 2022-01-12 DIAGNOSIS — K219 Gastro-esophageal reflux disease without esophagitis: Secondary | ICD-10-CM | POA: Diagnosis not present

## 2022-01-12 DIAGNOSIS — Z1289 Encounter for screening for malignant neoplasm of other sites: Secondary | ICD-10-CM | POA: Insufficient documentation

## 2022-01-12 DIAGNOSIS — R772 Abnormality of alphafetoprotein: Secondary | ICD-10-CM | POA: Insufficient documentation

## 2022-01-12 DIAGNOSIS — D1800 Hemangioma unspecified site: Secondary | ICD-10-CM | POA: Diagnosis not present

## 2022-01-12 DIAGNOSIS — K7460 Unspecified cirrhosis of liver: Secondary | ICD-10-CM | POA: Diagnosis not present

## 2022-01-12 DIAGNOSIS — E119 Type 2 diabetes mellitus without complications: Secondary | ICD-10-CM | POA: Diagnosis not present

## 2022-01-12 DIAGNOSIS — K7689 Other specified diseases of liver: Secondary | ICD-10-CM | POA: Diagnosis not present

## 2022-01-12 IMAGING — CT CT ABDOMEN WO/W CM
3 of 13 series · 11 of 46 positions shown, 17 images · IV contrast (Omnipaque or Isovue)
Comparison: Multiple priors including most recent MRI [DATE]

CLINICAL DATA: Cirrhosis, elevated AFP, HCC evaluation

EXAM:
CT ABDOMEN WITHOUT AND WITH CONTRAST
TECHNIQUE: Multidetector CT imaging of the abdomen was performed following the
standard protocol before and following the bolus administration of
intravenous contrast.

[Series 6: axial arterial · axial · arterial · 0.83mm/px · z∈[-306,-153]mm · 4 of 102 slices shown]
[im 17/102  soft-tissue]
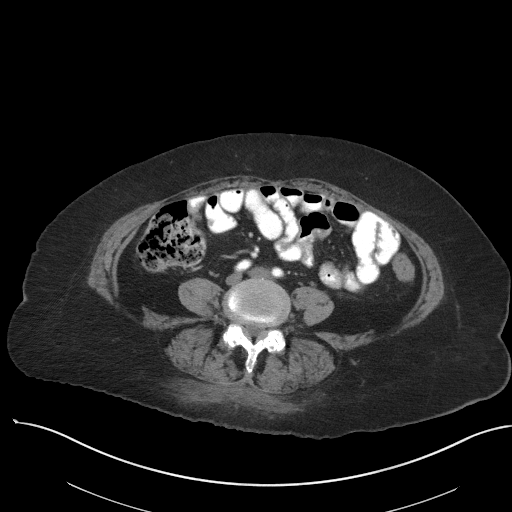
[im 34/102  soft-tissue]
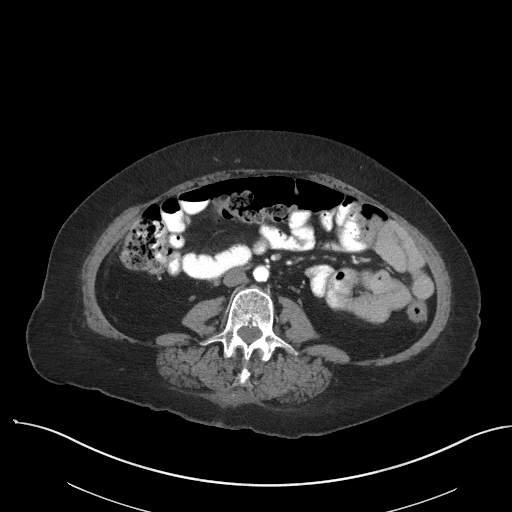
[im 51/102  soft-tissue]
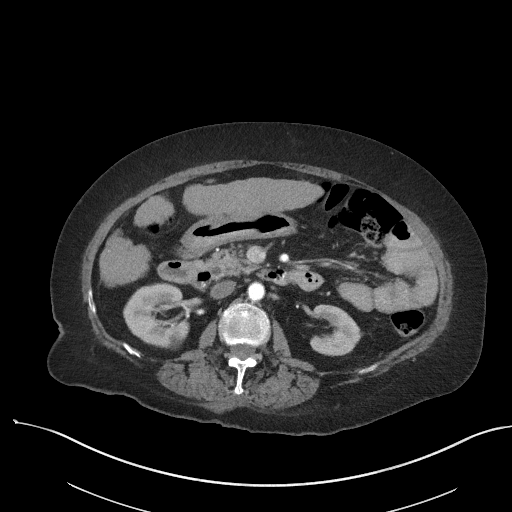
[im 68/102  soft-tissue]
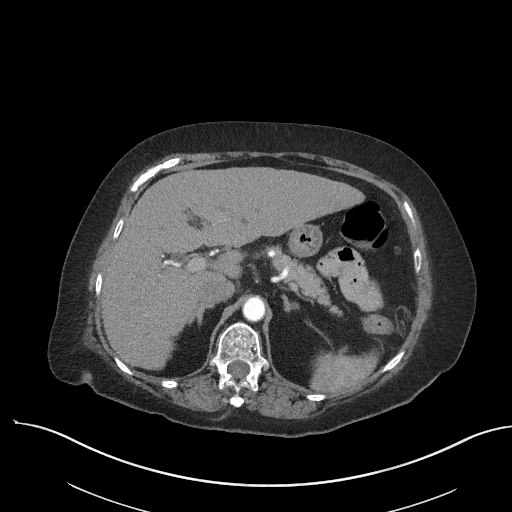

[Series 9: coronal arterial · coronal · arterial · 0.60mm/px · 2 of 101 slices shown, 3 images]
[im 34/101  soft-tissue]
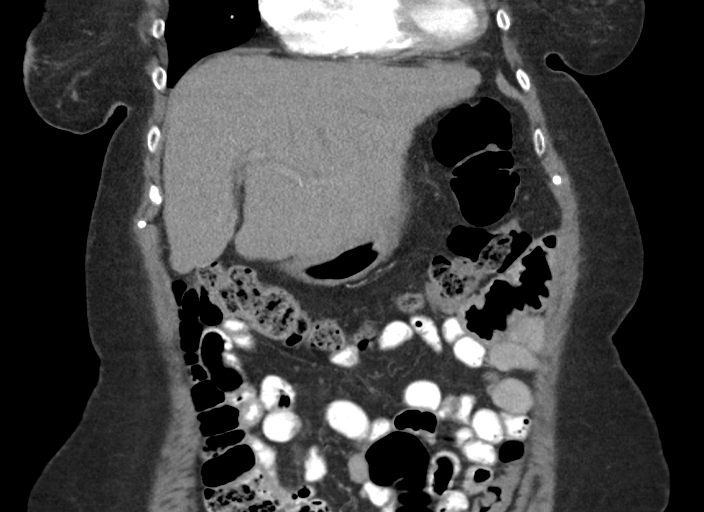
[im 34/101  bone]
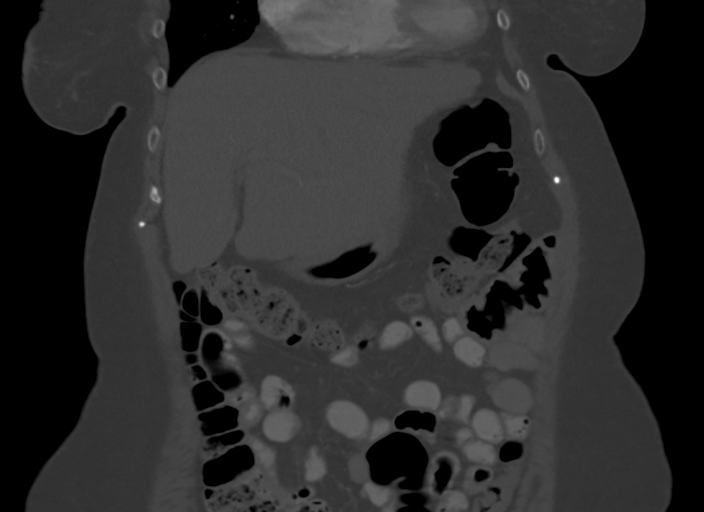
[im 67/101  soft-tissue]
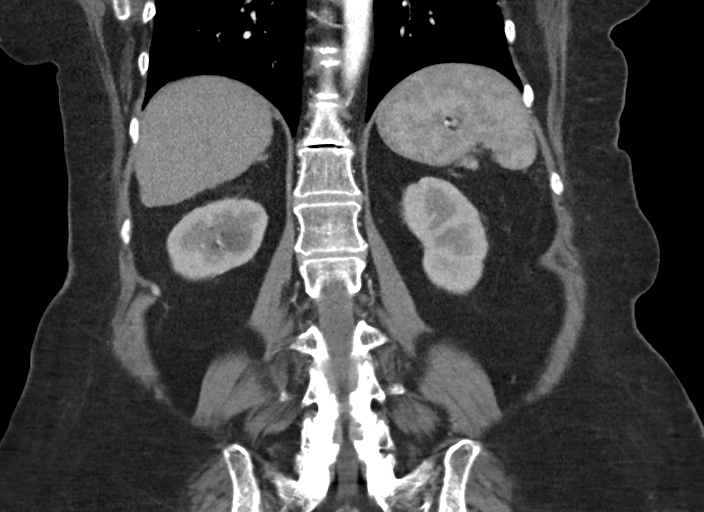

[Series 11: axial venous · axial · portal-venous · 0.83mm/px · z∈[-306,-102]mm · 5 of 102 slices shown, 10 images]
[im 17/102  soft-tissue]
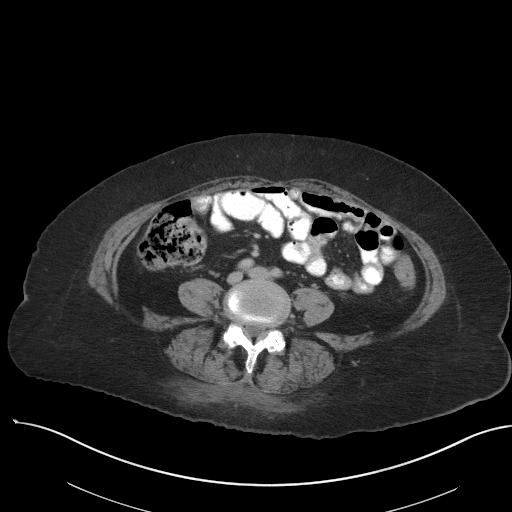
[im 17/102  bone]
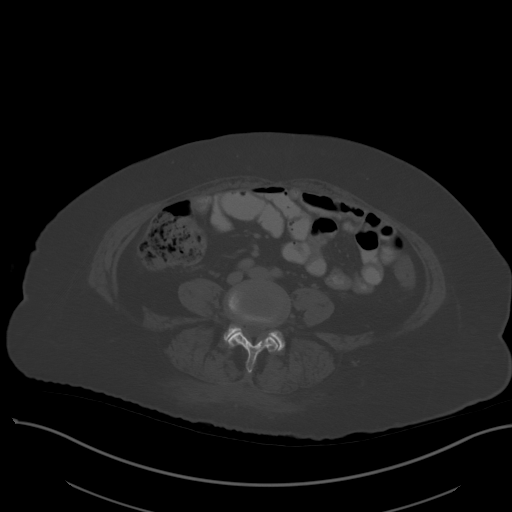
[im 34/102  soft-tissue]
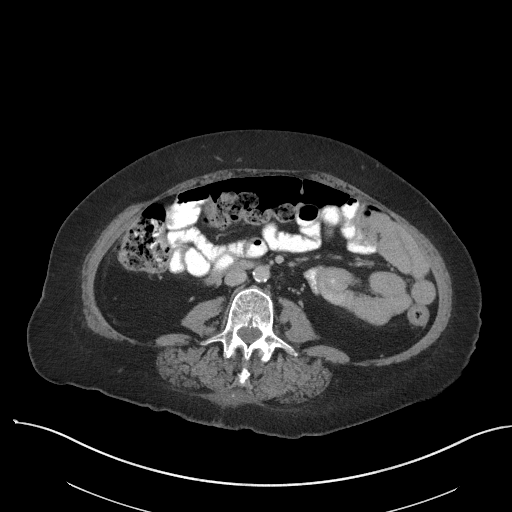
[im 34/102  lung]
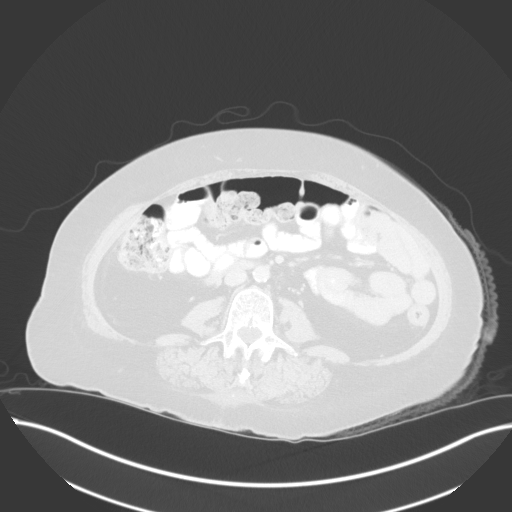
[im 51/102  soft-tissue]
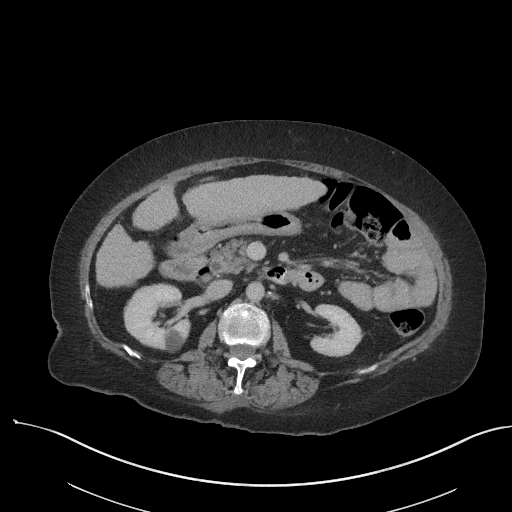
[im 51/102  lung]
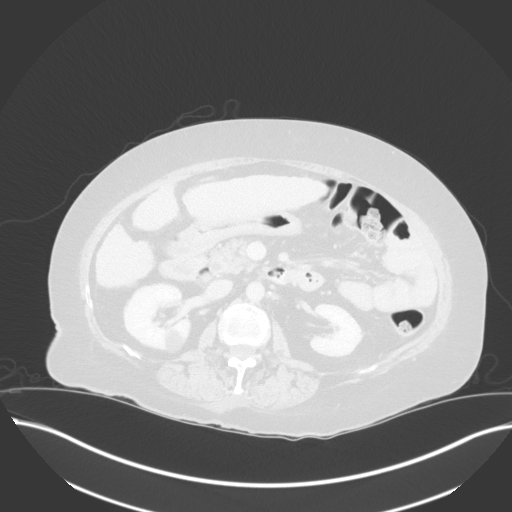
[im 68/102  soft-tissue]
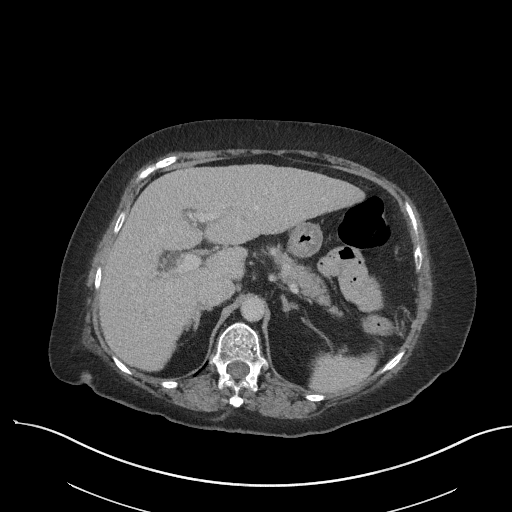
[im 68/102  lung]
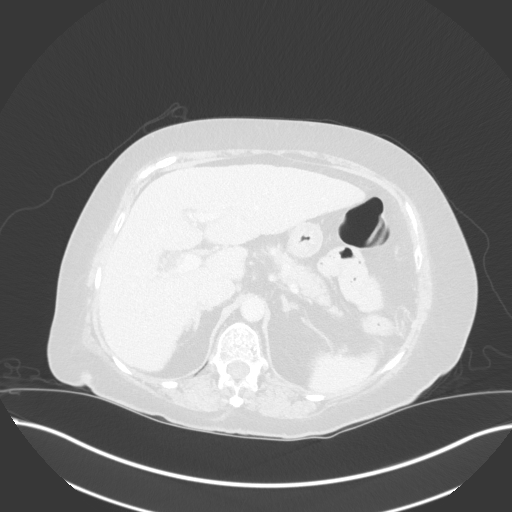
[im 85/102  soft-tissue]
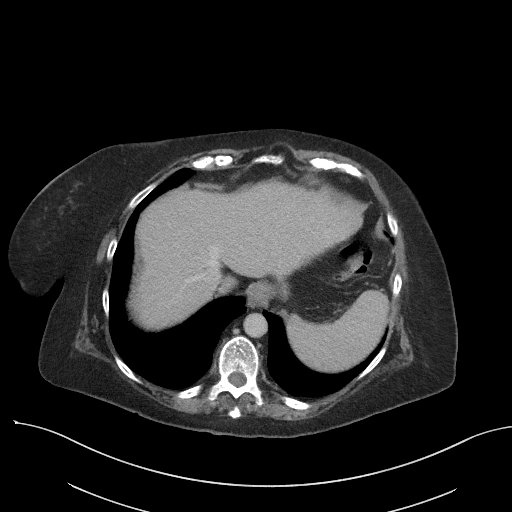
[im 85/102  lung]
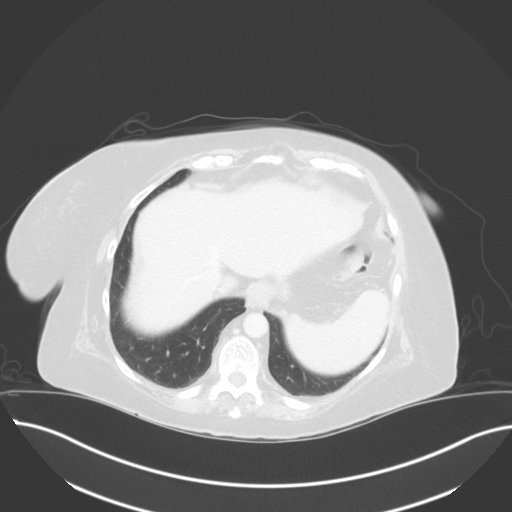

[11 of 46 positions shown; findings below may reference images not displayed]

RADIATION DOSE REDUCTION: This exam was performed according to the
departmental dose-optimization program which includes automated
exposure control, adjustment of the mA and/or kV according to
patient size and/or use of iterative reconstruction technique.

CONTRAST:  80mL OMNIPAQUE IOHEXOL 300 MG/ML  SOLN
FINDINGS: Lower chest: No acute abnormality.

Hepatobiliary: Cirrhotic hepatic morphology. No arterially enhancing
hepatic lesion. Hypodense 6 mm cyst in the left lobe of the liver.
Gallbladder surgically absent. No biliary ductal dilation.

Pancreas: No pancreatic ductal dilation or evidence of acute
inflammation.

Spleen: No splenomegaly or focal splenic lesion.

Adrenals/Urinary Tract: No hydronephrosis. No nephrolithiasis.
Nonenhancing 2.2 cm hypodense right renal lesion is consistent with
a cyst and considered benign requiring no independent imaging
follow-up.

Stomach/Bowel: Radiopaque enteric contrast material traverses the
ascending colon. Periampullary duodenal diverticulum. No pathologic
dilation or evidence of acute inflammation involving loops of large
or small bowel in the abdomen.

Vascular/Lymphatic: Aortic atherosclerosis. Normal caliber abdominal
aorta. Portal, splenic and superior mesenteric veins are patent. No
pathologically enlarged abdominal or pelvic lymph nodes.

Other: No significant abdominopelvic free fluid.

Musculoskeletal: No acute osseous abnormality. T11 vertebral body
hemangioma. Multilevel degenerative changes spine.
IMPRESSION: Cirrhotic hepatic morphology without suspicious hepatic lesion.

## 2022-01-12 MED ORDER — IOHEXOL 300 MG/ML  SOLN
80.0000 mL | Freq: Once | INTRAMUSCULAR | Status: AC | PRN
Start: 2022-01-12 — End: 2022-01-12
  Administered 2022-01-12: 80 mL via INTRAVENOUS

## 2022-01-14 ENCOUNTER — Other Ambulatory Visit: Payer: Self-pay

## 2022-01-14 DIAGNOSIS — R7989 Other specified abnormal findings of blood chemistry: Secondary | ICD-10-CM

## 2022-01-14 DIAGNOSIS — R772 Abnormality of alphafetoprotein: Secondary | ICD-10-CM

## 2022-01-17 LAB — POCT I-STAT CREATININE: Creatinine, Ser: 1.1 mg/dL — ABNORMAL HIGH (ref 0.44–1.00)

## 2022-01-18 ENCOUNTER — Other Ambulatory Visit: Payer: Self-pay | Admitting: Gastroenterology

## 2022-01-18 DIAGNOSIS — K219 Gastro-esophageal reflux disease without esophagitis: Secondary | ICD-10-CM

## 2022-01-20 DIAGNOSIS — R809 Proteinuria, unspecified: Secondary | ICD-10-CM | POA: Diagnosis not present

## 2022-01-20 DIAGNOSIS — Z789 Other specified health status: Secondary | ICD-10-CM | POA: Diagnosis not present

## 2022-01-20 DIAGNOSIS — E1165 Type 2 diabetes mellitus with hyperglycemia: Secondary | ICD-10-CM | POA: Diagnosis not present

## 2022-01-20 DIAGNOSIS — R42 Dizziness and giddiness: Secondary | ICD-10-CM | POA: Diagnosis not present

## 2022-01-20 DIAGNOSIS — E1129 Type 2 diabetes mellitus with other diabetic kidney complication: Secondary | ICD-10-CM | POA: Diagnosis not present

## 2022-01-20 DIAGNOSIS — D509 Iron deficiency anemia, unspecified: Secondary | ICD-10-CM | POA: Diagnosis not present

## 2022-01-20 DIAGNOSIS — Z299 Encounter for prophylactic measures, unspecified: Secondary | ICD-10-CM | POA: Diagnosis not present

## 2022-01-20 DIAGNOSIS — I10 Essential (primary) hypertension: Secondary | ICD-10-CM | POA: Diagnosis not present

## 2022-01-24 DIAGNOSIS — D649 Anemia, unspecified: Secondary | ICD-10-CM | POA: Diagnosis not present

## 2022-01-26 DIAGNOSIS — D649 Anemia, unspecified: Secondary | ICD-10-CM | POA: Diagnosis not present

## 2022-02-03 ENCOUNTER — Other Ambulatory Visit: Payer: Self-pay | Admitting: Internal Medicine

## 2022-02-03 DIAGNOSIS — Z1231 Encounter for screening mammogram for malignant neoplasm of breast: Secondary | ICD-10-CM

## 2022-02-10 DIAGNOSIS — E1165 Type 2 diabetes mellitus with hyperglycemia: Secondary | ICD-10-CM | POA: Diagnosis not present

## 2022-02-10 DIAGNOSIS — Z794 Long term (current) use of insulin: Secondary | ICD-10-CM | POA: Diagnosis not present

## 2022-02-15 ENCOUNTER — Other Ambulatory Visit: Payer: Self-pay | Admitting: Infectious Disease

## 2022-02-15 DIAGNOSIS — Z227 Latent tuberculosis: Secondary | ICD-10-CM

## 2022-02-28 DIAGNOSIS — M1711 Unilateral primary osteoarthritis, right knee: Secondary | ICD-10-CM | POA: Diagnosis not present

## 2022-02-28 DIAGNOSIS — M8588 Other specified disorders of bone density and structure, other site: Secondary | ICD-10-CM | POA: Diagnosis not present

## 2022-02-28 DIAGNOSIS — M546 Pain in thoracic spine: Secondary | ICD-10-CM | POA: Diagnosis not present

## 2022-03-01 ENCOUNTER — Other Ambulatory Visit (HOSPITAL_COMMUNITY): Payer: Self-pay | Admitting: Sports Medicine

## 2022-03-01 ENCOUNTER — Other Ambulatory Visit: Payer: Self-pay | Admitting: Sports Medicine

## 2022-03-01 ENCOUNTER — Ambulatory Visit (INDEPENDENT_AMBULATORY_CARE_PROVIDER_SITE_OTHER): Payer: Medicare Other | Admitting: Ophthalmology

## 2022-03-01 ENCOUNTER — Encounter (INDEPENDENT_AMBULATORY_CARE_PROVIDER_SITE_OTHER): Payer: Self-pay | Admitting: Ophthalmology

## 2022-03-01 DIAGNOSIS — H353122 Nonexudative age-related macular degeneration, left eye, intermediate dry stage: Secondary | ICD-10-CM | POA: Diagnosis not present

## 2022-03-01 DIAGNOSIS — H35372 Puckering of macula, left eye: Secondary | ICD-10-CM

## 2022-03-01 DIAGNOSIS — H353211 Exudative age-related macular degeneration, right eye, with active choroidal neovascularization: Secondary | ICD-10-CM

## 2022-03-01 DIAGNOSIS — E1165 Type 2 diabetes mellitus with hyperglycemia: Secondary | ICD-10-CM | POA: Diagnosis not present

## 2022-03-01 DIAGNOSIS — S22000A Wedge compression fracture of unspecified thoracic vertebra, initial encounter for closed fracture: Secondary | ICD-10-CM

## 2022-03-01 DIAGNOSIS — Z794 Long term (current) use of insulin: Secondary | ICD-10-CM | POA: Diagnosis not present

## 2022-03-01 MED ORDER — BEVACIZUMAB CHEMO INJECTION 1.25MG/0.05ML SYRINGE FOR KALEIDOSCOPE
1.2500 mg | INTRAVITREAL | Status: AC | PRN
  Administered 2022-03-01: 1.25 mg via INTRAVITREAL

## 2022-03-01 NOTE — Assessment & Plan Note (Signed)
The nature of wet macular degeneration was discussed with the patient.  Forms of therapy reviewed include the use of Anti-VEGF medications injected painlessly into the eye, as well as other possible treatment modalities, including thermal laser therapy. Fellow eye involvement and risks were discussed with the patient. Upon the finding of wet age related macular degeneration, treatment will be offered. The treatment regimen is on a treat as needed basis with the intent to treat if necessary and extend interval of exams when possible. On average 1 out of 6 patients do not need lifetime therapy. However, the risk of recurrent disease is high for a lifetime.  Initially monthly, then periodic, examinations and evaluations will determine whether the next treatment is required on the day of the examination.  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  Now some 4 months, 19 weeks post most recent injection.  Slightly more atrophy and less fluid.  Thus maintenance dosing appears to be effective OD.  We will repeat injection today and continue maintenance dosing and evaluation OD

## 2022-03-01 NOTE — Assessment & Plan Note (Signed)
Minor OS 

## 2022-03-01 NOTE — Progress Notes (Signed)
03/01/2022     CHIEF COMPLAINT Patient presents for  Chief Complaint  Patient presents with   Macular Degeneration      HISTORY OF PRESENT ILLNESS: Kristine Rocha is a 66 y.o. female who presents to the clinic today for:   HPI   19 weeks for DILATE OU, AVASTIN OCT,,possible AVASTIN OD. Pt stated vision remained stable. "On July 3rd I went to get 2 more units of blood. They gave me A- blood and I had a reaction so they stopped it. I did have FOL and I felt weird like I'm having migraines. They told me it wasn't a reaction. I went back on July 5th to resume the 2 units and they gave me O+ and I didn't experience any reactions." Last edited by Silvestre Moment on 03/01/2022 10:04 AM.      Referring physician: Glenda Chroman, MD [place],  [place] [address]  HISTORICAL INFORMATION:   Selected notes from the [place]: No current outpatient medications on file. (Ophthalmic Drugs)   No current facility-administered medications for this visit. (Ophthalmic Drugs)   Current Outpatient Medications (Other)  Medication Sig   atorvastatin (LIPITOR) 10 MG tablet Take 10 mg by mouth every evening.    Ferrous Sulfate (IRON) 325 (65 Fe) MG TABS Take 325 mg by mouth daily. With Vit C   hyoscyamine (LEVSIN SL) 0.125 MG SL tablet Place 0.125 mg under the tongue 3 (three) times daily as needed for cramping.   insulin lispro (HUMALOG) 100 UNIT/ML KwikPen Inject 3-5 Units into the skin 3 (three) times daily. Per sliding scale   JARDIANCE 25 MG TABS tablet Take 25 mg by mouth daily.   ketoconazole (NIZORAL) 2 % cream Apply 1 application topically daily. Groin area   levothyroxine (SYNTHROID) 88 MCG tablet Take 88 mcg by mouth daily before breakfast.   Melatonin 10 MG TABS Take 10 mg by mouth at bedtime.    metFORMIN (GLUCOPHAGE-XR) 500 MG 24 hr tablet Take 500 mg by mouth 2 (two) times daily.   metoprolol succinate (TOPROL-XL) 100 MG 24 hr tablet Take 100 mg by  mouth daily. *Hold for low blood pressure*   Multiple Vitamins-Minerals (PRESERVISION AREDS 2 PO) Take 1 tablet by mouth 2 (two) times daily.   omeprazole (PRILOSEC) 40 MG capsule TAKE 1 CAPSULE BY MOUTH TWICE  DAILY BEFORE A MEAL   ONETOUCH VERIO test strip    OVER THE COUNTER MEDICATION Take 1 capsule by mouth daily. Fruits and Vegetables   tiZANidine (ZANAFLEX) 4 MG tablet Take 4 mg by mouth 3 (three) times daily as needed for muscle spasms.    TRESIBA FLEXTOUCH 200 UNIT/ML FlexTouch Pen Inject 15 Units into the skin daily.   valsartan (DIOVAN) 40 MG tablet Take 40 mg by mouth daily.   No current facility-administered medications for this visit. (Other)      REVIEW OF SYSTEMS: ROS   Negative for: Constitutional, Gastrointestinal, Neurological, Skin, Genitourinary, Musculoskeletal, HENT, Endocrine, Cardiovascular, Eyes, Respiratory, Psychiatric, Allergic/Imm, Heme/Lymph Last edited by Silvestre Moment on 03/01/2022 10:04 AM.       ALLERGIES Allergies  Allergen Reactions   Amoxicillin Hives, Shortness Of Breath and Rash    Did it involve swelling of the face/tongue/throat, SOB, or low BP? Yes Did it involve sudden or severe rash/hives, skin peeling, or any reaction on the inside of your mouth or nose? Unknown Did you need to seek medical attention at a  hospital or doctor's office? Yes When did it last happen?~15-20 years ago       If all above answers are "NO", may proceed with cephalosporin use. .    Ampicillin Hives, Shortness Of Breath and Rash    Did it involve swelling of the face/tongue/throat, SOB, or low BP? Yes Did it involve sudden or severe rash/hives, skin peeling, or any reaction on the inside of your mouth or nose? Unknown Did you need to seek medical attention at a hospital or doctor's office? Yes When did it last happen?~15-20 years ago       If all above answers are "NO", may proceed with cephalosporin use. .   Methotrexate Derivatives Other (See Comments)     hepatoxicity   Dust Mite Extract     Sneezing, watery eyes    Molds & Smuts     Sneezing, watery eyes    Nsaids     Gastric Bleeds   Other     Trees-especially pines.   Prevacid [Lansoprazole] Diarrhea    PAST MEDICAL HISTORY Past Medical History:  Diagnosis Date   Asthma    Cirrhosis ([place]) 06/2019   Immune to Hep A. Has completed Hep B vaccine (May 2021)   Diabetes [facility])    Essential hypertension    GERD (gastroesophageal reflux disease)    History of GI bleed    Hypothyroidism    Macular degeneration    wet in right, dry in left, has injections for this.   Microcytic anemia    Palpitations    Peptic ulcer disease    Postural orthostatic tachycardia syndrome    Mildly orthostatic results tilt table test 2005  - Dr. 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    COLONOSCOPY WITH PROPOFOL N/A 09/27/2021   Procedure: COLONOSCOPY WITH PROPOFOL;  Surgeon: Eloise Harman, DO;  [facility];  Service: Endoscopy;  Laterality: N/A;  11:15am   ESOPHAGOGASTRODUODENOSCOPY (EGD) WITH PROPOFOL N/A 11/04/2019   Procedure: ESOPHAGOGASTRODUODENOSCOPY (EGD) WITH PROPOFOL;  Surgeon: Daneil Dolin, MD; normal esophagus s/p empiric dilation, abnormal appearing gastric mucosa  s/p biopsy, abnormal duodenal mucosa s/p biopsied, no ulcer, infiltrating process, or portal gastropathy.  Duodenal biopsies benign.  Gastric biopsy with reactive gastropathy with erosions, no H. pylori.    ESOPHAGOGASTRODUODENOSCOPY (EGD) WITH PROPOFOL N/A 09/27/2021   Procedure: ESOPHAGOGASTRODUODENOSCOPY (EGD) WITH PROPOFOL;  Surgeon: Eloise Harman, DO;  [facility];  Service: Endoscopy;  Laterality: N/A;   MALONEY DILATION N/A 11/04/2019   Procedure: Venia Minks DILATION;  Surgeon: Daneil Dolin, MD;  [facility];  Service: Endoscopy;  Laterality: N/A;   TILT TABLE STUDY  2005   [facility]   TRIGGER FINGER RELEASE Right  2nd finger    FAMILY HISTORY Family History  Problem Relation Age of Onset   Heart attack Father        Age 8   Arrhythmia Sister        WPW s/p ablation- [facility]   Alzheimer's disease Mother    Pneumonia Mother        Aspiration   Heart attack Brother    Heart attack Maternal Grandfather    Colon cancer Maternal Aunt        diagnosed at 61   Breast cancer Maternal Grandmother     SOCIAL HISTORY Social History   Tobacco Use   Smoking status: Never   Smokeless tobacco: Never  Vaping Use   Vaping Use: Never used  Substance Use Topics   Alcohol use: No    Alcohol/week: 0.0 standard drinks of alcohol   Drug use: No         OPHTHALMIC EXAM:  Base Eye Exam     Visual Acuity (ETDRS)       Right Left   Dist [place] 20/100 -2 20/20 -2   Dist ph [place] 20/80 -2          Tonometry (Tonopen, 10:08 AM)       Right Left   Pressure 14 12         Pupils       Pupils APD   Right PERRL None   Left PERRL None         Visual Fields       Left Right    Full Full         Extraocular Movement       Right Left    Full, Ortho Full, Ortho         Neuro/Psych     Oriented x3: Yes   Mood/Affect: Normal         Dilation     Both eyes: 1.0% Mydriacyl, 2.5% Phenylephrine @ 10:08 AM            Slit Lamp and Fundus Exam     External Exam       Right Left   External Normal Normal         Slit Lamp Exam       Right Left   Lids/Lashes Normal Normal   Conjunctiva/Sclera White and quiet White and quiet   Cornea Clear Clear   Anterior Chamber Deep and quiet Deep and quiet   Iris Round and reactive, TI defect 530-630 Round and reactive   Lens Posterior chamber intraocular lens Posterior chamber intraocular lens   Anterior Vitreous Normal Normal         Fundus Exam       Right Left   Posterior Vitreous Posterior vitreous detachment Posterior vitreous detachment   Disc Normal Normal   C/D Ratio 0.5 0.5   Macula Retinal pigment epithelial atrophy in the FAZ, Advanced age related macular degeneration, Hard drusen, no exudates, no hemorrhage, Mottling, Retinal pigment epithelial mottling, Geographic atrophy, Soft drusen, no macular thickening Hard drusen, no hemorrhage, no macular thickening, no exudates, Epiretinal membrane minor, nasal   Vessels Normal Normal   Periphery Normal Normal            IMAGING AND PROCEDURES  Imaging and Procedures for 03/01/22  OCT, Retina - OU - Both Eyes       Right Eye Quality was good. Scan locations included subfoveal. Central Foveal Thickness: 185. Progression has been stable. Findings include  no IRF, abnormal foveal contour, retinal drusen , subretinal fluid, central retinal atrophy, inner retinal atrophy, outer retinal atrophy.   Left Eye Quality was good. Scan locations included subfoveal. Central Foveal Thickness: 263. Progression has been stable. Findings include no IRF, no SRF, retinal drusen , epiretinal membrane.   Notes Central foveal atrophy with chronic residual subretinal fluid Yet stable overall at 81-monthinterval post Avastin.   Still no signs of recurrence of No risk of scotoma enlargement.  OD. CNVM will continue to monitor and observe OD  OS with minor epiretinal membrane nasal to the fovea not center  involved not impactful to vision        Intravitreal Injection, Pharmacologic Agent - OD - Right Eye       Time Out 03/01/2022. 10:52 AM. Confirmed correct patient, procedure, site, and patient consented.   Anesthesia Topical anesthesia was used. Anesthetic medications included Lidocaine 4%.   Procedure Preparation included 5% betadine to ocular surface, 10% betadine to eyelids, Ofloxacin . A 30 gauge needle was used.   Injection: 1.25 mg Bevacizumab 1.'25mg'$ /0.096m  Route: Intravitreal, Site: Right Eye   NDC: [id number]: [id number] date: 05/21/2022   Post-op Post injection exam found visual acuity of at least counting fingers. The patient tolerated the procedure well. There were no complications. The patient received written and verbal post procedure care education. Post injection medications were not given.              ASSESSMENT/PLAN:  Macular pucker, left eye Minor OS  Exudative age-related macular degeneration of right eye with active choroidal neovascularization (HCC) The nature of wet macular degeneration was discussed with the patient.  Forms of therapy reviewed include the use of Anti-VEGF medications injected painlessly into the eye, as well as other possible treatment modalities, including thermal laser therapy. Fellow eye involvement and risks were discussed with the patient. Upon the finding of wet age related macular degeneration, treatment will be offered. The treatment regimen is on a treat as needed basis with the intent to treat if necessary and extend interval of exams when possible. On average 1 out of 6 patients do not need lifetime therapy. However, the risk of recurrent disease is high for a lifetime.  Initially monthly, then periodic, examinations and evaluations will determine whether the next treatment is required on the day of the examination.  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  Now some 4 months, 19 weeks post most recent  injection.  Slightly more atrophy and less fluid.  Thus maintenance dosing appears to be effective OD.  We will repeat injection today and continue maintenance dosing and evaluation OD  Intermediate stage nonexudative age-related macular degeneration of left eye OS no sign of CNVM today     ICD-10-CM   1. Exudative age-related macular degeneration of right eye with active choroidal neovascularization (HCC)  H35.3211 OCT, Retina - OU - Both Eyes    Intravitreal Injection, Pharmacologic Agent - OD - Right Eye    Bevacizumab (AVASTIN) SOLN 1.25 mg    2. Macular pucker, left eye  H35.372     3. Intermediate stage nonexudative age-related macular degeneration of left eye  H35.3122       1.  OD overall stable condition.  Visually as well as clinically finding.  OCT also slightly improved with less subretinal fluid today at 4-32-monthllow-up interval on maintenance evaluation and potential dosing of Avastin.  We will repeat Avastin OD today  2.  OS with no interval  change in the findings macula.  No need for intervention.  3.  Patient continue on oral therapy vitamin  Ophthalmic Meds Ordered this visit:  Meds ordered this encounter  Medications   Bevacizumab (AVASTIN) SOLN 1.25 mg       Return in about 4 months (around 07/01/2022) for DILATE OU, AVASTIN OCT, OD.  There are no Patient Instructions on file for this visit.   Explained the diagnoses, plan, and follow up with the patient and they expressed understanding.  Patient expressed understanding of the importance of proper follow up care.   Clent Demark Kileen Lange M.D. Diseases & Surgery of the Retina and Vitreous Retina [facility] Diabetic [place] 03/01/22     Abbreviations: M myopia (nearsighted); A astigmatism; H hyperopia (farsighted); P presbyopia; Mrx spectacle prescription;  CTL contact lenses; OD right eye; OS left eye; OU both eyes  XT exotropia; ET esotropia; PEK punctate epithelial keratitis; PEE punctate epithelial erosions; DES  dry eye syndrome; MGD meibomian gland dysfunction; ATs artificial tears; PFAT's preservative free artificial tears; [place] nuclear sclerotic cataract; PSC posterior subcapsular cataract; ERM epi-retinal membrane; PVD posterior vitreous detachment; RD retinal detachment; DM diabetes mellitus; DR diabetic retinopathy; NPDR non-proliferative diabetic retinopathy; PDR proliferative diabetic retinopathy; CSME clinically significant macular edema; DME diabetic macular edema; dbh dot blot hemorrhages; CWS cotton wool spot; POAG primary open angle glaucoma; C/D cup-to-disc ratio; HVF humphrey visual field; GVF goldmann visual field; OCT optical coherence tomography; IOP intraocular pressure; BRVO Branch retinal vein occlusion; CRVO central retinal vein occlusion; CRAO central retinal artery occlusion; BRAO branch retinal artery occlusion; RT retinal tear; SB scleral buckle; PPV pars plana vitrectomy; VH Vitreous hemorrhage; PRP panretinal laser photocoagulation; IVK intravitreal kenalog; VMT vitreomacular traction; MH Macular hole;  NVD neovascularization of the disc; NVE neovascularization elsewhere; AREDS age related eye disease study; ARMD age related macular degeneration; POAG primary open angle glaucoma; EBMD epithelial/anterior basement membrane dystrophy; ACIOL anterior chamber intraocular lens; IOL intraocular lens; PCIOL posterior chamber intraocular lens; Phaco/IOL phacoemulsification with intraocular lens placement; [place] photorefractive keratectomy; LASIK laser assisted in situ keratomileusis; HTN hypertension; DM diabetes mellitus; COPD chronic obstructive pulmonary disease

## 2022-03-01 NOTE — Assessment & Plan Note (Signed)
OS no sign of CNVM today

## 2022-03-02 ENCOUNTER — Ambulatory Visit
Admission: RE | Admit: 2022-03-02 | Discharge: 2022-03-02 | Disposition: A | Discharge status: Home / Self Care | Payer: Medicare Other | Source: Ambulatory Visit | Attending: Internal Medicine | Admitting: Internal Medicine

## 2022-03-02 DIAGNOSIS — Z1231 Encounter for screening mammogram for malignant neoplasm of breast: Secondary | ICD-10-CM | POA: Diagnosis not present

## 2022-03-04 ENCOUNTER — Other Ambulatory Visit: Payer: Self-pay | Admitting: Internal Medicine

## 2022-03-04 DIAGNOSIS — R928 Other abnormal and inconclusive findings on diagnostic imaging of breast: Secondary | ICD-10-CM

## 2022-03-10 ENCOUNTER — Ambulatory Visit
Admission: RE | Admit: 2022-03-10 | Discharge: 2022-03-10 | Disposition: A | Discharge status: Home / Self Care | Payer: Medicare Other | Source: Ambulatory Visit | Attending: Internal Medicine | Admitting: Internal Medicine

## 2022-03-10 ENCOUNTER — Other Ambulatory Visit: Payer: Self-pay | Admitting: Internal Medicine

## 2022-03-10 DIAGNOSIS — R921 Mammographic calcification found on diagnostic imaging of breast: Secondary | ICD-10-CM | POA: Diagnosis not present

## 2022-03-10 DIAGNOSIS — R928 Other abnormal and inconclusive findings on diagnostic imaging of breast: Secondary | ICD-10-CM

## 2022-03-16 ENCOUNTER — Ambulatory Visit (HOSPITAL_COMMUNITY)
Admission: RE | Admit: 2022-03-16 | Discharge: 2022-03-16 | Disposition: A | Discharge status: Home / Self Care | Payer: Medicare Other | Source: Ambulatory Visit | Attending: Sports Medicine | Admitting: Sports Medicine

## 2022-03-16 DIAGNOSIS — S22000A Wedge compression fracture of unspecified thoracic vertebra, initial encounter for closed fracture: Secondary | ICD-10-CM | POA: Diagnosis not present

## 2022-03-16 DIAGNOSIS — M40204 Unspecified kyphosis, thoracic region: Secondary | ICD-10-CM | POA: Diagnosis not present

## 2022-03-16 DIAGNOSIS — M546 Pain in thoracic spine: Secondary | ICD-10-CM | POA: Diagnosis not present

## 2022-03-23 DIAGNOSIS — E1165 Type 2 diabetes mellitus with hyperglycemia: Secondary | ICD-10-CM | POA: Diagnosis not present

## 2022-03-24 DIAGNOSIS — E039 Hypothyroidism, unspecified: Secondary | ICD-10-CM | POA: Diagnosis not present

## 2022-03-24 DIAGNOSIS — K219 Gastro-esophageal reflux disease without esophagitis: Secondary | ICD-10-CM | POA: Diagnosis not present

## 2022-04-01 ENCOUNTER — Ambulatory Visit
Admission: RE | Admit: 2022-04-01 | Discharge: 2022-04-01 | Disposition: A | Discharge status: Home / Self Care | Payer: Medicare Other | Source: Ambulatory Visit | Attending: Internal Medicine | Admitting: Internal Medicine

## 2022-04-01 DIAGNOSIS — R921 Mammographic calcification found on diagnostic imaging of breast: Secondary | ICD-10-CM | POA: Diagnosis not present

## 2022-04-01 DIAGNOSIS — R928 Other abnormal and inconclusive findings on diagnostic imaging of breast: Secondary | ICD-10-CM

## 2022-04-01 DIAGNOSIS — N6489 Other specified disorders of breast: Secondary | ICD-10-CM | POA: Diagnosis not present

## 2022-04-04 DIAGNOSIS — M546 Pain in thoracic spine: Secondary | ICD-10-CM | POA: Diagnosis not present

## 2022-04-04 DIAGNOSIS — M25562 Pain in left knee: Secondary | ICD-10-CM | POA: Diagnosis not present

## 2022-04-04 DIAGNOSIS — M170 Bilateral primary osteoarthritis of knee: Secondary | ICD-10-CM | POA: Diagnosis not present

## 2022-04-04 DIAGNOSIS — I10 Essential (primary) hypertension: Secondary | ICD-10-CM | POA: Diagnosis not present

## 2022-04-04 DIAGNOSIS — D509 Iron deficiency anemia, unspecified: Secondary | ICD-10-CM | POA: Diagnosis not present

## 2022-04-04 DIAGNOSIS — Z299 Encounter for prophylactic measures, unspecified: Secondary | ICD-10-CM | POA: Diagnosis not present

## 2022-04-04 DIAGNOSIS — E1165 Type 2 diabetes mellitus with hyperglycemia: Secondary | ICD-10-CM | POA: Diagnosis not present

## 2022-04-08 ENCOUNTER — Other Ambulatory Visit: Payer: Self-pay | Admitting: Infectious Disease

## 2022-04-08 DIAGNOSIS — Z227 Latent tuberculosis: Secondary | ICD-10-CM

## 2022-04-11 ENCOUNTER — Other Ambulatory Visit: Payer: Self-pay

## 2022-04-11 DIAGNOSIS — R772 Abnormality of alphafetoprotein: Secondary | ICD-10-CM

## 2022-04-11 DIAGNOSIS — R7989 Other specified abnormal findings of blood chemistry: Secondary | ICD-10-CM

## 2022-04-19 DIAGNOSIS — R768 Other specified abnormal immunological findings in serum: Secondary | ICD-10-CM | POA: Diagnosis not present

## 2022-04-19 DIAGNOSIS — K7460 Unspecified cirrhosis of liver: Secondary | ICD-10-CM | POA: Diagnosis not present

## 2022-04-19 DIAGNOSIS — M1991 Primary osteoarthritis, unspecified site: Secondary | ICD-10-CM | POA: Diagnosis not present

## 2022-04-19 DIAGNOSIS — M1009 Idiopathic gout, multiple sites: Secondary | ICD-10-CM | POA: Diagnosis not present

## 2022-04-19 DIAGNOSIS — R772 Abnormality of alphafetoprotein: Secondary | ICD-10-CM | POA: Diagnosis not present

## 2022-04-19 DIAGNOSIS — R7989 Other specified abnormal findings of blood chemistry: Secondary | ICD-10-CM | POA: Diagnosis not present

## 2022-04-19 DIAGNOSIS — L409 Psoriasis, unspecified: Secondary | ICD-10-CM | POA: Diagnosis not present

## 2022-04-20 ENCOUNTER — Telehealth: Payer: Self-pay

## 2022-04-20 LAB — AFP TUMOR MARKER: AFP, Serum, Tumor Marker: 3.4 ng/mL (ref 0.0–9.2)

## 2022-04-20 NOTE — Telephone Encounter (Signed)
Pt called wanting to know the results to her recent blood work. Please advise.

## 2022-04-21 ENCOUNTER — Other Ambulatory Visit: Payer: Self-pay

## 2022-04-21 DIAGNOSIS — R772 Abnormality of alphafetoprotein: Secondary | ICD-10-CM

## 2022-04-22 DIAGNOSIS — E1165 Type 2 diabetes mellitus with hyperglycemia: Secondary | ICD-10-CM | POA: Diagnosis not present

## 2022-04-25 DIAGNOSIS — M5414 Radiculopathy, thoracic region: Secondary | ICD-10-CM | POA: Diagnosis not present

## 2022-05-05 DIAGNOSIS — Z961 Presence of intraocular lens: Secondary | ICD-10-CM | POA: Diagnosis not present

## 2022-05-05 DIAGNOSIS — H35372 Puckering of macula, left eye: Secondary | ICD-10-CM | POA: Diagnosis not present

## 2022-05-05 DIAGNOSIS — H353211 Exudative age-related macular degeneration, right eye, with active choroidal neovascularization: Secondary | ICD-10-CM | POA: Diagnosis not present

## 2022-05-05 DIAGNOSIS — E119 Type 2 diabetes mellitus without complications: Secondary | ICD-10-CM | POA: Diagnosis not present

## 2022-05-05 DIAGNOSIS — H353122 Nonexudative age-related macular degeneration, left eye, intermediate dry stage: Secondary | ICD-10-CM | POA: Diagnosis not present

## 2022-05-17 DIAGNOSIS — E7800 Pure hypercholesterolemia, unspecified: Secondary | ICD-10-CM | POA: Diagnosis not present

## 2022-05-17 DIAGNOSIS — E039 Hypothyroidism, unspecified: Secondary | ICD-10-CM | POA: Diagnosis not present

## 2022-05-17 DIAGNOSIS — I10 Essential (primary) hypertension: Secondary | ICD-10-CM | POA: Diagnosis not present

## 2022-05-17 DIAGNOSIS — E1165 Type 2 diabetes mellitus with hyperglycemia: Secondary | ICD-10-CM | POA: Diagnosis not present

## 2022-05-23 DIAGNOSIS — M546 Pain in thoracic spine: Secondary | ICD-10-CM | POA: Diagnosis not present

## 2022-05-23 DIAGNOSIS — M5414 Radiculopathy, thoracic region: Secondary | ICD-10-CM | POA: Diagnosis not present

## 2022-05-23 DIAGNOSIS — E1165 Type 2 diabetes mellitus with hyperglycemia: Secondary | ICD-10-CM | POA: Diagnosis not present

## 2022-05-23 DIAGNOSIS — Z794 Long term (current) use of insulin: Secondary | ICD-10-CM | POA: Diagnosis not present

## 2022-05-24 ENCOUNTER — Telehealth: Payer: Self-pay | Admitting: *Deleted

## 2022-05-24 NOTE — Telephone Encounter (Signed)
Patient on recall for [place] in December

## 2022-05-24 NOTE — Telephone Encounter (Signed)
Letter sent.

## 2022-06-22 DIAGNOSIS — E1165 Type 2 diabetes mellitus with hyperglycemia: Secondary | ICD-10-CM | POA: Diagnosis not present

## 2022-07-04 ENCOUNTER — Encounter (INDEPENDENT_AMBULATORY_CARE_PROVIDER_SITE_OTHER): Payer: Medicare Other | Admitting: Ophthalmology

## 2022-07-04 DIAGNOSIS — H353211 Exudative age-related macular degeneration, right eye, with active choroidal neovascularization: Secondary | ICD-10-CM | POA: Diagnosis not present

## 2022-07-04 DIAGNOSIS — H353122 Nonexudative age-related macular degeneration, left eye, intermediate dry stage: Secondary | ICD-10-CM | POA: Diagnosis not present

## 2022-07-04 DIAGNOSIS — H353114 Nonexudative age-related macular degeneration, right eye, advanced atrophic with subfoveal involvement: Secondary | ICD-10-CM | POA: Diagnosis not present

## 2022-07-22 DIAGNOSIS — E1165 Type 2 diabetes mellitus with hyperglycemia: Secondary | ICD-10-CM | POA: Diagnosis not present

## 2022-08-09 DIAGNOSIS — E1165 Type 2 diabetes mellitus with hyperglycemia: Secondary | ICD-10-CM | POA: Diagnosis not present

## 2022-08-09 DIAGNOSIS — H353211 Exudative age-related macular degeneration, right eye, with active choroidal neovascularization: Secondary | ICD-10-CM | POA: Diagnosis not present

## 2022-08-09 DIAGNOSIS — I10 Essential (primary) hypertension: Secondary | ICD-10-CM | POA: Diagnosis not present

## 2022-08-09 DIAGNOSIS — Z299 Encounter for prophylactic measures, unspecified: Secondary | ICD-10-CM | POA: Diagnosis not present

## 2022-08-09 DIAGNOSIS — D696 Thrombocytopenia, unspecified: Secondary | ICD-10-CM | POA: Diagnosis not present

## 2022-08-22 DIAGNOSIS — E1165 Type 2 diabetes mellitus with hyperglycemia: Secondary | ICD-10-CM | POA: Diagnosis not present

## 2022-09-13 DIAGNOSIS — E1165 Type 2 diabetes mellitus with hyperglycemia: Secondary | ICD-10-CM | POA: Diagnosis not present

## 2022-09-13 DIAGNOSIS — Z299 Encounter for prophylactic measures, unspecified: Secondary | ICD-10-CM | POA: Diagnosis not present

## 2022-09-13 DIAGNOSIS — I10 Essential (primary) hypertension: Secondary | ICD-10-CM | POA: Diagnosis not present

## 2022-09-21 DIAGNOSIS — E1165 Type 2 diabetes mellitus with hyperglycemia: Secondary | ICD-10-CM | POA: Diagnosis not present

## 2022-10-03 ENCOUNTER — Other Ambulatory Visit: Payer: Self-pay

## 2022-10-03 DIAGNOSIS — E1165 Type 2 diabetes mellitus with hyperglycemia: Secondary | ICD-10-CM | POA: Diagnosis not present

## 2022-10-03 DIAGNOSIS — R772 Abnormality of alphafetoprotein: Secondary | ICD-10-CM

## 2022-10-03 DIAGNOSIS — Z794 Long term (current) use of insulin: Secondary | ICD-10-CM | POA: Diagnosis not present

## 2022-10-18 DIAGNOSIS — K7460 Unspecified cirrhosis of liver: Secondary | ICD-10-CM | POA: Diagnosis not present

## 2022-10-18 DIAGNOSIS — R768 Other specified abnormal immunological findings in serum: Secondary | ICD-10-CM | POA: Diagnosis not present

## 2022-10-18 DIAGNOSIS — M1009 Idiopathic gout, multiple sites: Secondary | ICD-10-CM | POA: Diagnosis not present

## 2022-10-18 DIAGNOSIS — R5383 Other fatigue: Secondary | ICD-10-CM | POA: Diagnosis not present

## 2022-10-18 DIAGNOSIS — M1991 Primary osteoarthritis, unspecified site: Secondary | ICD-10-CM | POA: Diagnosis not present

## 2022-10-18 DIAGNOSIS — Z79899 Other long term (current) drug therapy: Secondary | ICD-10-CM | POA: Diagnosis not present

## 2022-10-18 DIAGNOSIS — L409 Psoriasis, unspecified: Secondary | ICD-10-CM | POA: Diagnosis not present

## 2022-10-22 DIAGNOSIS — E1165 Type 2 diabetes mellitus with hyperglycemia: Secondary | ICD-10-CM | POA: Diagnosis not present

## 2022-10-24 ENCOUNTER — Telehealth: Payer: Self-pay

## 2022-10-24 NOTE — Telephone Encounter (Signed)
Lab results on desk for review.

## 2022-10-25 ENCOUNTER — Telehealth: Payer: Self-pay | Admitting: Internal Medicine

## 2022-10-25 DIAGNOSIS — I10 Essential (primary) hypertension: Secondary | ICD-10-CM | POA: Diagnosis not present

## 2022-10-25 DIAGNOSIS — Z0000 Encounter for general adult medical examination without abnormal findings: Secondary | ICD-10-CM | POA: Diagnosis not present

## 2022-10-25 DIAGNOSIS — E039 Hypothyroidism, unspecified: Secondary | ICD-10-CM | POA: Diagnosis not present

## 2022-10-25 DIAGNOSIS — Z299 Encounter for prophylactic measures, unspecified: Secondary | ICD-10-CM | POA: Diagnosis not present

## 2022-10-25 DIAGNOSIS — Z7189 Other specified counseling: Secondary | ICD-10-CM | POA: Diagnosis not present

## 2022-10-25 DIAGNOSIS — I700 Atherosclerosis of aorta: Secondary | ICD-10-CM | POA: Diagnosis not present

## 2022-10-25 DIAGNOSIS — D692 Other nonthrombocytopenic purpura: Secondary | ICD-10-CM | POA: Diagnosis not present

## 2022-10-25 NOTE — Telephone Encounter (Signed)
Labs on this lady labeled "Kristine Rocha" DOB 12/30/1955 MR [id number] from October 18, 2002 white count 7.6 platelet count 198,000 hemoglobin [id number] metabolic profile creatinine 1.17 albumin 4.1 alkaline phosphatase slightly elevated at 127 total bilirubin 0.3 AST 26 ALT 19 AFP from September 2023 normal at 2.9  This lady is overdue for cirrhosis care in this office.  Also apparently overdue for a adequate prep colonoscopy.  She would be best served by returning to see an app to tie off loose ends continue with care.

## 2022-11-16 DIAGNOSIS — E7800 Pure hypercholesterolemia, unspecified: Secondary | ICD-10-CM | POA: Diagnosis not present

## 2022-11-16 DIAGNOSIS — M810 Age-related osteoporosis without current pathological fracture: Secondary | ICD-10-CM | POA: Diagnosis not present

## 2022-11-16 DIAGNOSIS — I10 Essential (primary) hypertension: Secondary | ICD-10-CM | POA: Diagnosis not present

## 2022-11-16 DIAGNOSIS — E039 Hypothyroidism, unspecified: Secondary | ICD-10-CM | POA: Diagnosis not present

## 2022-11-16 DIAGNOSIS — E1165 Type 2 diabetes mellitus with hyperglycemia: Secondary | ICD-10-CM | POA: Diagnosis not present

## 2022-11-22 DIAGNOSIS — E1165 Type 2 diabetes mellitus with hyperglycemia: Secondary | ICD-10-CM | POA: Diagnosis not present

## 2022-11-27 ENCOUNTER — Other Ambulatory Visit: Payer: Self-pay | Admitting: Internal Medicine

## 2022-11-27 DIAGNOSIS — K219 Gastro-esophageal reflux disease without esophagitis: Secondary | ICD-10-CM

## 2022-12-12 DIAGNOSIS — H353114 Nonexudative age-related macular degeneration, right eye, advanced atrophic with subfoveal involvement: Secondary | ICD-10-CM | POA: Diagnosis not present

## 2022-12-12 DIAGNOSIS — H353212 Exudative age-related macular degeneration, right eye, with inactive choroidal neovascularization: Secondary | ICD-10-CM | POA: Diagnosis not present

## 2022-12-12 DIAGNOSIS — H353122 Nonexudative age-related macular degeneration, left eye, intermediate dry stage: Secondary | ICD-10-CM | POA: Diagnosis not present

## 2022-12-23 DIAGNOSIS — E1165 Type 2 diabetes mellitus with hyperglycemia: Secondary | ICD-10-CM | POA: Diagnosis not present

## 2022-12-27 DIAGNOSIS — R5383 Other fatigue: Secondary | ICD-10-CM | POA: Diagnosis not present

## 2022-12-27 DIAGNOSIS — Z79899 Other long term (current) drug therapy: Secondary | ICD-10-CM | POA: Diagnosis not present

## 2022-12-27 DIAGNOSIS — Z0000 Encounter for general adult medical examination without abnormal findings: Secondary | ICD-10-CM | POA: Diagnosis not present

## 2022-12-27 DIAGNOSIS — Z299 Encounter for prophylactic measures, unspecified: Secondary | ICD-10-CM | POA: Diagnosis not present

## 2022-12-27 DIAGNOSIS — E039 Hypothyroidism, unspecified: Secondary | ICD-10-CM | POA: Diagnosis not present

## 2022-12-27 DIAGNOSIS — I10 Essential (primary) hypertension: Secondary | ICD-10-CM | POA: Diagnosis not present

## 2022-12-27 DIAGNOSIS — E7800 Pure hypercholesterolemia, unspecified: Secondary | ICD-10-CM | POA: Diagnosis not present

## 2023-01-22 DIAGNOSIS — E1165 Type 2 diabetes mellitus with hyperglycemia: Secondary | ICD-10-CM | POA: Diagnosis not present

## 2023-01-27 DIAGNOSIS — Z91048 Other nonmedicinal substance allergy status: Secondary | ICD-10-CM | POA: Diagnosis not present

## 2023-01-27 DIAGNOSIS — Z886 Allergy status to analgesic agent status: Secondary | ICD-10-CM | POA: Diagnosis not present

## 2023-01-27 DIAGNOSIS — Z880 Allergy status to penicillin: Secondary | ICD-10-CM | POA: Diagnosis not present

## 2023-01-27 DIAGNOSIS — R030 Elevated blood-pressure reading, without diagnosis of hypertension: Secondary | ICD-10-CM | POA: Diagnosis not present

## 2023-01-27 DIAGNOSIS — M542 Cervicalgia: Secondary | ICD-10-CM | POA: Diagnosis not present

## 2023-01-27 DIAGNOSIS — E041 Nontoxic single thyroid nodule: Secondary | ICD-10-CM | POA: Diagnosis not present

## 2023-01-27 DIAGNOSIS — E119 Type 2 diabetes mellitus without complications: Secondary | ICD-10-CM | POA: Diagnosis not present

## 2023-01-27 DIAGNOSIS — Z7984 Long term (current) use of oral hypoglycemic drugs: Secondary | ICD-10-CM | POA: Diagnosis not present

## 2023-01-27 DIAGNOSIS — X501XXA Overexertion from prolonged static or awkward postures, initial encounter: Secondary | ICD-10-CM | POA: Diagnosis not present

## 2023-01-27 DIAGNOSIS — T148XXA Other injury of unspecified body region, initial encounter: Secondary | ICD-10-CM | POA: Diagnosis not present

## 2023-01-27 DIAGNOSIS — G44309 Post-traumatic headache, unspecified, not intractable: Secondary | ICD-10-CM | POA: Diagnosis not present

## 2023-01-27 DIAGNOSIS — I10 Essential (primary) hypertension: Secondary | ICD-10-CM | POA: Diagnosis not present

## 2023-01-27 DIAGNOSIS — Z888 Allergy status to other drugs, medicaments and biological substances status: Secondary | ICD-10-CM | POA: Diagnosis not present

## 2023-01-27 DIAGNOSIS — S0181XA Laceration without foreign body of other part of head, initial encounter: Secondary | ICD-10-CM | POA: Diagnosis not present

## 2023-01-27 DIAGNOSIS — L989 Disorder of the skin and subcutaneous tissue, unspecified: Secondary | ICD-10-CM | POA: Diagnosis not present

## 2023-01-27 DIAGNOSIS — I6782 Cerebral ischemia: Secondary | ICD-10-CM | POA: Diagnosis not present

## 2023-02-03 DIAGNOSIS — Z299 Encounter for prophylactic measures, unspecified: Secondary | ICD-10-CM | POA: Diagnosis not present

## 2023-02-03 DIAGNOSIS — R519 Headache, unspecified: Secondary | ICD-10-CM | POA: Diagnosis not present

## 2023-02-03 DIAGNOSIS — I10 Essential (primary) hypertension: Secondary | ICD-10-CM | POA: Diagnosis not present

## 2023-02-07 DIAGNOSIS — Z4802 Encounter for removal of sutures: Secondary | ICD-10-CM | POA: Diagnosis not present

## 2023-02-07 DIAGNOSIS — I10 Essential (primary) hypertension: Secondary | ICD-10-CM | POA: Diagnosis not present

## 2023-02-07 DIAGNOSIS — Z299 Encounter for prophylactic measures, unspecified: Secondary | ICD-10-CM | POA: Diagnosis not present

## 2023-02-10 ENCOUNTER — Other Ambulatory Visit: Payer: Self-pay | Admitting: Internal Medicine

## 2023-02-10 DIAGNOSIS — K219 Gastro-esophageal reflux disease without esophagitis: Secondary | ICD-10-CM

## 2023-02-15 ENCOUNTER — Telehealth: Payer: Self-pay | Admitting: *Deleted

## 2023-02-15 NOTE — Progress Notes (Signed)
  Care Coordination   Note   02/15/2023 Name: Kristine Rocha MRN: [id number] DOB: 12-05-1955  Kristine Rocha is a 67 y.o. year old female who sees Vyas, Angelina Pih, MD for primary care. I reached out to Daun Peacock by phone today to offer care coordination services.  [name] was given information about Care Coordination services today including:   The Care Coordination services include support from the care team which includes your Nurse Coordinator, Clinical Social Worker, or Pharmacist.  The Care Coordination team is here to help remove barriers to the health concerns and goals most important to you. Care Coordination services are voluntary, and the patient may decline or stop services at any time by request to their care team member.   Care Coordination Consent Status: Patient agreed to services and verbal consent obtained.   Follow up plan:  Telephone appointment with care coordination team member scheduled for:  03/02/23  Encounter Outcome:  Pt. Scheduled [facility] Coordination Care Guide  Direct Dial: [phone]

## 2023-02-22 DIAGNOSIS — E1165 Type 2 diabetes mellitus with hyperglycemia: Secondary | ICD-10-CM | POA: Diagnosis not present

## 2023-02-28 DIAGNOSIS — E1165 Type 2 diabetes mellitus with hyperglycemia: Secondary | ICD-10-CM | POA: Diagnosis not present

## 2023-03-02 ENCOUNTER — Ambulatory Visit: Payer: Self-pay | Admitting: *Deleted

## 2023-03-02 NOTE — Patient Outreach (Signed)
  Care Coordination   03/02/2023 Name: Kristine Rocha MRN: [id number] DOB: 1956-04-30   Care Coordination Outreach Attempts:  An unsuccessful telephone outreach was attempted for a scheduled appointment today.  Follow Up Plan:  Additional outreach attempts will be made to offer the patient care coordination information and services.   Encounter Outcome:  No Answer. Left HIPAA compliant VM.   Care Coordination Interventions:  No, not indicated    Demetrios Loll, BSN, RN-BC RN Care Coordinator [facility]  [facility] Direct Dial: [phone] Main #: [phone]

## 2023-03-24 ENCOUNTER — Encounter: Payer: Self-pay | Admitting: *Deleted

## 2023-03-24 ENCOUNTER — Ambulatory Visit: Payer: Self-pay | Admitting: *Deleted

## 2023-03-24 NOTE — Patient Outreach (Signed)
Care Coordination   Initial Visit Note   03/24/2023 Name: Kristine Rocha MRN: [id number] DOB: [birth year]  Kristine Rocha is a 68 y.o. year old female who sees Vyas, Angelina Pih, MD for primary care. I spoke with  Daun Peacock by phone today.  What matters to the patients health and wellness today?  Ongoing self-management of chronic medical conditions. Patient feels that her medical conditions are well managed at this time and she declines further follow-up calls from the care management department. She was provided with RN Care Management Coordinator's telephone number to reach out regarding prescription assistance for Jardiance if Dr Talmage Nap wants her to continue taking it.    Goals Addressed             This Visit's Progress    Care Coordination  Services (No follow-up needed)       Care Coordination Goals: Patient will keep appointment with ophthalmologist on 04/10/23 and Dr Talmage Nap (endocrinologist) in October 2024. Patient will reach out to RN Care Management Coordinator at [phone] with any resource or care coordination needs        SDOH assessments and interventions completed:  Yes  SDOH Interventions Today    Flowsheet Row Most Recent Value  SDOH Interventions   Food Insecurity Interventions Intervention Not Indicated  Housing Interventions Intervention Not Indicated  Transportation Interventions Patient Resources (Friends/Family)  Utilities Interventions Intervention Not Indicated  Financial Strain Interventions Intervention Not Indicated  Physical Activity Interventions Patient Declined  Stress Interventions Intervention Not Indicated  Social Connections Interventions Intervention Not Indicated  Health Literacy Interventions Intervention Not Indicated        Care Coordination Interventions:  Yes, provided  Interventions Today    Flowsheet Row Most Recent Value  Chronic Disease   Chronic disease during today's visit Diabetes, Hypertension (HTN)  General  Interventions   General Interventions Discussed/Reviewed General Interventions Discussed, General Interventions Reviewed, Annual Eye Exam, Doctor Visits, Durable Medical Equipment (DME), Annual Foot Exam, Labs  Labs Hgb A1c every 6 months, Kidney Function  Doctor Visits Discussed/Reviewed Doctor Visits Discussed, PCP, Specialist, Doctor Visits Reviewed, Annual Wellness Visits  Durable Medical Equipment (DME) Glucomoter, BP Cuff  [uses [employer] and also sends blood sugar readings to Dr Sherril Croon office each morning. Blood sugar 95 this morning. No readings below 70 or above 200. Does not check blood pressure regularly but averages around 120/80.]  PCP/Specialist Visits Compliance with follow-up visit  [ophthalmologist on 04/10/23 and Dr Talmage Nap (endocrinologist) in October]  Exercise Interventions   Exercise Discussed/Reviewed Physical Activity, Exercise Discussed, Exercise Reviewed  Physical Activity Discussed/Reviewed Physical Activity Discussed, Physical Activity Reviewed  [performs ADLs and IADLs independently but does not exercise. Encouraged 150 minutes of moderate physical activity weekly]  Education Interventions   Education Provided Provided Education  Provided Verbal Education On Blood Sugar Monitoring, When to see the doctor, Exercise, Medication  [encouraged to monitor and record blood pressure several times a week in order to catch if blood pressure is rising over time.]  Mental Health Interventions   Mental Health Discussed/Reviewed Mental Health Discussed, Mental Health Reviewed  Nutrition Interventions   Nutrition Discussed/Reviewed Nutrition Discussed, Nutrition Reviewed  [Encouraged to continue well balanced diet.]  Pharmacy Interventions   Pharmacy Dicussed/Reviewed Medications and their functions, Pharmacy Topics Discussed, Pharmacy Topics Reviewed  [Splits Tresiba into 2 doses. Dr Talmage Nap is aware. Medication list updated. Jardiance was $500 this month for a 3 month supply. She  plans to talk with Dr Talmage Nap at next Appt. about  D/C it anyway out concern for kidney function]  Safety Interventions   Safety Discussed/Reviewed Safety Discussed, Safety Reviewed, Fall Risk       Follow up plan: No further intervention required.   Encounter Outcome:  Pt. Visit Completed   Kristine Loll, RN, BSN RN Care Coordinator [facility]  [facility] Direct Dial: [phone] Main #: [phone]

## 2023-03-25 DIAGNOSIS — E1165 Type 2 diabetes mellitus with hyperglycemia: Secondary | ICD-10-CM | POA: Diagnosis not present

## 2023-04-03 DIAGNOSIS — J309 Allergic rhinitis, unspecified: Secondary | ICD-10-CM | POA: Diagnosis not present

## 2023-04-03 DIAGNOSIS — I152 Hypertension secondary to endocrine disorders: Secondary | ICD-10-CM | POA: Diagnosis not present

## 2023-04-03 DIAGNOSIS — Z299 Encounter for prophylactic measures, unspecified: Secondary | ICD-10-CM | POA: Diagnosis not present

## 2023-04-03 DIAGNOSIS — K7460 Unspecified cirrhosis of liver: Secondary | ICD-10-CM | POA: Diagnosis not present

## 2023-04-03 DIAGNOSIS — H353211 Exudative age-related macular degeneration, right eye, with active choroidal neovascularization: Secondary | ICD-10-CM | POA: Diagnosis not present

## 2023-04-03 DIAGNOSIS — E1159 Type 2 diabetes mellitus with other circulatory complications: Secondary | ICD-10-CM | POA: Diagnosis not present

## 2023-04-03 DIAGNOSIS — I10 Essential (primary) hypertension: Secondary | ICD-10-CM | POA: Diagnosis not present

## 2023-04-07 ENCOUNTER — Other Ambulatory Visit: Payer: Self-pay | Admitting: Gastroenterology

## 2023-04-07 DIAGNOSIS — K219 Gastro-esophageal reflux disease without esophagitis: Secondary | ICD-10-CM

## 2023-04-11 DIAGNOSIS — H353212 Exudative age-related macular degeneration, right eye, with inactive choroidal neovascularization: Secondary | ICD-10-CM | POA: Diagnosis not present

## 2023-04-11 DIAGNOSIS — H353114 Nonexudative age-related macular degeneration, right eye, advanced atrophic with subfoveal involvement: Secondary | ICD-10-CM | POA: Diagnosis not present

## 2023-04-11 DIAGNOSIS — E113293 Type 2 diabetes mellitus with mild nonproliferative diabetic retinopathy without macular edema, bilateral: Secondary | ICD-10-CM | POA: Diagnosis not present

## 2023-04-11 DIAGNOSIS — H353122 Nonexudative age-related macular degeneration, left eye, intermediate dry stage: Secondary | ICD-10-CM | POA: Diagnosis not present

## 2023-04-18 DIAGNOSIS — L409 Psoriasis, unspecified: Secondary | ICD-10-CM | POA: Diagnosis not present

## 2023-04-18 DIAGNOSIS — Z79899 Other long term (current) drug therapy: Secondary | ICD-10-CM | POA: Diagnosis not present

## 2023-04-18 DIAGNOSIS — M1991 Primary osteoarthritis, unspecified site: Secondary | ICD-10-CM | POA: Diagnosis not present

## 2023-04-18 DIAGNOSIS — M1009 Idiopathic gout, multiple sites: Secondary | ICD-10-CM | POA: Diagnosis not present

## 2023-04-18 DIAGNOSIS — K7460 Unspecified cirrhosis of liver: Secondary | ICD-10-CM | POA: Diagnosis not present

## 2023-04-18 DIAGNOSIS — R768 Other specified abnormal immunological findings in serum: Secondary | ICD-10-CM | POA: Diagnosis not present

## 2023-04-24 DIAGNOSIS — E1165 Type 2 diabetes mellitus with hyperglycemia: Secondary | ICD-10-CM | POA: Diagnosis not present

## 2023-04-24 DIAGNOSIS — M1711 Unilateral primary osteoarthritis, right knee: Secondary | ICD-10-CM | POA: Diagnosis not present

## 2023-05-02 DIAGNOSIS — H353114 Nonexudative age-related macular degeneration, right eye, advanced atrophic with subfoveal involvement: Secondary | ICD-10-CM | POA: Diagnosis not present

## 2023-05-02 DIAGNOSIS — E113293 Type 2 diabetes mellitus with mild nonproliferative diabetic retinopathy without macular edema, bilateral: Secondary | ICD-10-CM | POA: Diagnosis not present

## 2023-05-18 DIAGNOSIS — E1165 Type 2 diabetes mellitus with hyperglycemia: Secondary | ICD-10-CM | POA: Diagnosis not present

## 2023-05-18 DIAGNOSIS — I10 Essential (primary) hypertension: Secondary | ICD-10-CM | POA: Diagnosis not present

## 2023-05-18 DIAGNOSIS — E039 Hypothyroidism, unspecified: Secondary | ICD-10-CM | POA: Diagnosis not present

## 2023-05-18 DIAGNOSIS — D649 Anemia, unspecified: Secondary | ICD-10-CM | POA: Diagnosis not present

## 2023-05-18 DIAGNOSIS — E7800 Pure hypercholesterolemia, unspecified: Secondary | ICD-10-CM | POA: Diagnosis not present

## 2023-05-18 DIAGNOSIS — M810 Age-related osteoporosis without current pathological fracture: Secondary | ICD-10-CM | POA: Diagnosis not present

## 2023-05-18 DIAGNOSIS — Z23 Encounter for immunization: Secondary | ICD-10-CM | POA: Diagnosis not present

## 2023-05-24 DIAGNOSIS — E1165 Type 2 diabetes mellitus with hyperglycemia: Secondary | ICD-10-CM | POA: Diagnosis not present

## 2023-06-13 DIAGNOSIS — H353114 Nonexudative age-related macular degeneration, right eye, advanced atrophic with subfoveal involvement: Secondary | ICD-10-CM | POA: Diagnosis not present

## 2023-06-13 DIAGNOSIS — H353122 Nonexudative age-related macular degeneration, left eye, intermediate dry stage: Secondary | ICD-10-CM | POA: Diagnosis not present

## 2023-06-13 DIAGNOSIS — E113293 Type 2 diabetes mellitus with mild nonproliferative diabetic retinopathy without macular edema, bilateral: Secondary | ICD-10-CM | POA: Diagnosis not present

## 2023-06-13 DIAGNOSIS — H353212 Exudative age-related macular degeneration, right eye, with inactive choroidal neovascularization: Secondary | ICD-10-CM | POA: Diagnosis not present

## 2023-06-23 DIAGNOSIS — E1165 Type 2 diabetes mellitus with hyperglycemia: Secondary | ICD-10-CM | POA: Diagnosis not present

## 2023-06-28 DIAGNOSIS — E7800 Pure hypercholesterolemia, unspecified: Secondary | ICD-10-CM | POA: Diagnosis not present

## 2023-06-28 DIAGNOSIS — E1165 Type 2 diabetes mellitus with hyperglycemia: Secondary | ICD-10-CM | POA: Diagnosis not present

## 2023-06-28 DIAGNOSIS — D649 Anemia, unspecified: Secondary | ICD-10-CM | POA: Diagnosis not present

## 2023-07-10 DIAGNOSIS — M170 Bilateral primary osteoarthritis of knee: Secondary | ICD-10-CM | POA: Diagnosis not present

## 2023-07-24 DIAGNOSIS — E1165 Type 2 diabetes mellitus with hyperglycemia: Secondary | ICD-10-CM | POA: Diagnosis not present

## 2023-07-24 DIAGNOSIS — J309 Allergic rhinitis, unspecified: Secondary | ICD-10-CM | POA: Diagnosis not present

## 2023-07-24 DIAGNOSIS — E1159 Type 2 diabetes mellitus with other circulatory complications: Secondary | ICD-10-CM | POA: Diagnosis not present

## 2023-07-24 DIAGNOSIS — H353211 Exudative age-related macular degeneration, right eye, with active choroidal neovascularization: Secondary | ICD-10-CM | POA: Diagnosis not present

## 2023-07-24 DIAGNOSIS — I10 Essential (primary) hypertension: Secondary | ICD-10-CM | POA: Diagnosis not present

## 2023-07-24 DIAGNOSIS — I152 Hypertension secondary to endocrine disorders: Secondary | ICD-10-CM | POA: Diagnosis not present

## 2023-07-24 DIAGNOSIS — Z299 Encounter for prophylactic measures, unspecified: Secondary | ICD-10-CM | POA: Diagnosis not present

## 2023-07-25 DIAGNOSIS — E113293 Type 2 diabetes mellitus with mild nonproliferative diabetic retinopathy without macular edema, bilateral: Secondary | ICD-10-CM | POA: Diagnosis not present

## 2023-07-25 DIAGNOSIS — H353122 Nonexudative age-related macular degeneration, left eye, intermediate dry stage: Secondary | ICD-10-CM | POA: Diagnosis not present

## 2023-07-25 DIAGNOSIS — H353212 Exudative age-related macular degeneration, right eye, with inactive choroidal neovascularization: Secondary | ICD-10-CM | POA: Diagnosis not present

## 2023-07-25 DIAGNOSIS — E1165 Type 2 diabetes mellitus with hyperglycemia: Secondary | ICD-10-CM | POA: Diagnosis not present

## 2023-07-25 DIAGNOSIS — H353114 Nonexudative age-related macular degeneration, right eye, advanced atrophic with subfoveal involvement: Secondary | ICD-10-CM | POA: Diagnosis not present

## 2023-08-29 DIAGNOSIS — J209 Acute bronchitis, unspecified: Secondary | ICD-10-CM | POA: Diagnosis not present

## 2023-08-29 DIAGNOSIS — R0981 Nasal congestion: Secondary | ICD-10-CM | POA: Diagnosis not present

## 2023-09-05 DIAGNOSIS — H353114 Nonexudative age-related macular degeneration, right eye, advanced atrophic with subfoveal involvement: Secondary | ICD-10-CM | POA: Diagnosis not present

## 2023-09-05 DIAGNOSIS — H353122 Nonexudative age-related macular degeneration, left eye, intermediate dry stage: Secondary | ICD-10-CM | POA: Diagnosis not present

## 2023-09-05 DIAGNOSIS — E113293 Type 2 diabetes mellitus with mild nonproliferative diabetic retinopathy without macular edema, bilateral: Secondary | ICD-10-CM | POA: Diagnosis not present

## 2023-09-05 DIAGNOSIS — H353212 Exudative age-related macular degeneration, right eye, with inactive choroidal neovascularization: Secondary | ICD-10-CM | POA: Diagnosis not present

## 2023-10-16 DIAGNOSIS — I700 Atherosclerosis of aorta: Secondary | ICD-10-CM | POA: Diagnosis not present

## 2023-10-16 DIAGNOSIS — K7460 Unspecified cirrhosis of liver: Secondary | ICD-10-CM | POA: Diagnosis not present

## 2023-10-16 DIAGNOSIS — Z7189 Other specified counseling: Secondary | ICD-10-CM | POA: Diagnosis not present

## 2023-10-16 DIAGNOSIS — I10 Essential (primary) hypertension: Secondary | ICD-10-CM | POA: Diagnosis not present

## 2023-10-16 DIAGNOSIS — Z0000 Encounter for general adult medical examination without abnormal findings: Secondary | ICD-10-CM | POA: Diagnosis not present

## 2023-10-16 DIAGNOSIS — Z713 Dietary counseling and surveillance: Secondary | ICD-10-CM | POA: Diagnosis not present

## 2023-10-16 DIAGNOSIS — Z299 Encounter for prophylactic measures, unspecified: Secondary | ICD-10-CM | POA: Diagnosis not present

## 2023-10-16 DIAGNOSIS — H353211 Exudative age-related macular degeneration, right eye, with active choroidal neovascularization: Secondary | ICD-10-CM | POA: Diagnosis not present

## 2023-10-19 DIAGNOSIS — E113293 Type 2 diabetes mellitus with mild nonproliferative diabetic retinopathy without macular edema, bilateral: Secondary | ICD-10-CM | POA: Diagnosis not present

## 2023-10-19 DIAGNOSIS — H353122 Nonexudative age-related macular degeneration, left eye, intermediate dry stage: Secondary | ICD-10-CM | POA: Diagnosis not present

## 2023-10-19 DIAGNOSIS — H35372 Puckering of macula, left eye: Secondary | ICD-10-CM | POA: Diagnosis not present

## 2023-10-19 DIAGNOSIS — H353114 Nonexudative age-related macular degeneration, right eye, advanced atrophic with subfoveal involvement: Secondary | ICD-10-CM | POA: Diagnosis not present

## 2023-10-19 DIAGNOSIS — H353212 Exudative age-related macular degeneration, right eye, with inactive choroidal neovascularization: Secondary | ICD-10-CM | POA: Diagnosis not present

## 2023-11-02 DIAGNOSIS — K7460 Unspecified cirrhosis of liver: Secondary | ICD-10-CM | POA: Diagnosis not present

## 2023-11-02 DIAGNOSIS — M1009 Idiopathic gout, multiple sites: Secondary | ICD-10-CM | POA: Diagnosis not present

## 2023-11-02 DIAGNOSIS — R5383 Other fatigue: Secondary | ICD-10-CM | POA: Diagnosis not present

## 2023-11-02 DIAGNOSIS — R768 Other specified abnormal immunological findings in serum: Secondary | ICD-10-CM | POA: Diagnosis not present

## 2023-11-02 DIAGNOSIS — M1991 Primary osteoarthritis, unspecified site: Secondary | ICD-10-CM | POA: Diagnosis not present

## 2023-11-02 DIAGNOSIS — L409 Psoriasis, unspecified: Secondary | ICD-10-CM | POA: Diagnosis not present

## 2023-11-02 DIAGNOSIS — R42 Dizziness and giddiness: Secondary | ICD-10-CM | POA: Diagnosis not present

## 2023-11-02 DIAGNOSIS — Z79899 Other long term (current) drug therapy: Secondary | ICD-10-CM | POA: Diagnosis not present

## 2023-11-03 DIAGNOSIS — Z299 Encounter for prophylactic measures, unspecified: Secondary | ICD-10-CM | POA: Diagnosis not present

## 2023-11-03 DIAGNOSIS — I10 Essential (primary) hypertension: Secondary | ICD-10-CM | POA: Diagnosis not present

## 2023-11-03 DIAGNOSIS — D692 Other nonthrombocytopenic purpura: Secondary | ICD-10-CM | POA: Diagnosis not present

## 2023-11-03 DIAGNOSIS — E1159 Type 2 diabetes mellitus with other circulatory complications: Secondary | ICD-10-CM | POA: Diagnosis not present

## 2023-11-03 DIAGNOSIS — D509 Iron deficiency anemia, unspecified: Secondary | ICD-10-CM | POA: Diagnosis not present

## 2023-11-03 DIAGNOSIS — R42 Dizziness and giddiness: Secondary | ICD-10-CM | POA: Diagnosis not present

## 2023-11-16 DIAGNOSIS — E7800 Pure hypercholesterolemia, unspecified: Secondary | ICD-10-CM | POA: Diagnosis not present

## 2023-11-16 DIAGNOSIS — I10 Essential (primary) hypertension: Secondary | ICD-10-CM | POA: Diagnosis not present

## 2023-11-16 DIAGNOSIS — E1165 Type 2 diabetes mellitus with hyperglycemia: Secondary | ICD-10-CM | POA: Diagnosis not present

## 2023-11-16 DIAGNOSIS — E039 Hypothyroidism, unspecified: Secondary | ICD-10-CM | POA: Diagnosis not present

## 2023-11-16 DIAGNOSIS — M810 Age-related osteoporosis without current pathological fracture: Secondary | ICD-10-CM | POA: Diagnosis not present

## 2023-11-20 DIAGNOSIS — D849 Immunodeficiency, unspecified: Secondary | ICD-10-CM | POA: Diagnosis not present

## 2023-11-20 DIAGNOSIS — Z299 Encounter for prophylactic measures, unspecified: Secondary | ICD-10-CM | POA: Diagnosis not present

## 2023-11-20 DIAGNOSIS — I10 Essential (primary) hypertension: Secondary | ICD-10-CM | POA: Diagnosis not present

## 2023-11-20 DIAGNOSIS — E7800 Pure hypercholesterolemia, unspecified: Secondary | ICD-10-CM | POA: Diagnosis not present

## 2023-11-20 DIAGNOSIS — E1165 Type 2 diabetes mellitus with hyperglycemia: Secondary | ICD-10-CM | POA: Diagnosis not present

## 2023-11-20 DIAGNOSIS — D649 Anemia, unspecified: Secondary | ICD-10-CM | POA: Diagnosis not present

## 2023-11-21 ENCOUNTER — Ambulatory Visit: Admitting: Internal Medicine

## 2023-11-21 ENCOUNTER — Telehealth: Payer: Self-pay | Admitting: *Deleted

## 2023-11-21 ENCOUNTER — Encounter: Payer: Self-pay | Admitting: Internal Medicine

## 2023-11-21 VITALS — BP 136/62 | HR 69 | Temp 98.5°F | Ht 60.5 in | Wt 175.6 lb

## 2023-11-21 DIAGNOSIS — D509 Iron deficiency anemia, unspecified: Secondary | ICD-10-CM

## 2023-11-21 DIAGNOSIS — R772 Abnormality of alphafetoprotein: Secondary | ICD-10-CM

## 2023-11-21 DIAGNOSIS — R7989 Other specified abnormal findings of blood chemistry: Secondary | ICD-10-CM

## 2023-11-21 DIAGNOSIS — Z8719 Personal history of other diseases of the digestive system: Secondary | ICD-10-CM | POA: Diagnosis not present

## 2023-11-21 DIAGNOSIS — K7460 Unspecified cirrhosis of liver: Secondary | ICD-10-CM | POA: Diagnosis not present

## 2023-11-21 DIAGNOSIS — R1310 Dysphagia, unspecified: Secondary | ICD-10-CM

## 2023-11-21 NOTE — Telephone Encounter (Signed)
 Pt aware of US  appt details. Also aware will call once we receive June schedule to schedule procedures

## 2023-11-21 NOTE — Progress Notes (Signed)
 Primary Care Physician:  Orlena Bitters, MD Primary Gastroenterologist:  Dr.   Pre-Procedure History & Physical: HPI:  Kristine Rocha is a 68 y.o. female with MASALD/cirrhosis.  Here for further evaluation of iron deficiency anemia iron deficiency goes back at least 3 years more recent hemoglobin through [facility] 9.4 microcytic indices.  Platelet count 258,000.  Previously noted elevated AFP ,nodular liver on MRI but no focal lesion 1 year ago alpha-fetoprotein came back to the normal range.  Capsule study entertained previously but patient declined.  No esophageal varices in 2023 non-H. pylori gastritis.  Poor prep on colonoscopy 2023.  Minute 61-month second attempt.  This did not happen.  Patient denies melena or hematochezia no nausea or vomiting; does endorse intermittent esophageal dysphagia.  Past Medical History:  Diagnosis Date   Asthma    Cirrhosis (HCC) 06/2019   Immune to Hep A. Has completed Hep B vaccine (May 2021)   Diabetes [facility])    Essential hypertension    GERD (gastroesophageal reflux disease)    History of GI bleed    Hypothyroidism    Macular degeneration    wet in right, dry in left, has injections for this.   Microcytic anemia    Palpitations    Peptic ulcer disease    Postural orthostatic tachycardia syndrome    Mildly orthostatic results tilt table test 2005  - Dr. 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    COLONOSCOPY WITH PROPOFOL  N/A 09/27/2021   Procedure: COLONOSCOPY WITH PROPOFOL ;  Surgeon: Vinetta Greening, DO;  [facility]: [facility];  Service: Endoscopy;  Laterality: N/A;  11:15am   ESOPHAGOGASTRODUODENOSCOPY (EGD) WITH PROPOFOL  N/A 11/04/2019   Procedure: ESOPHAGOGASTRODUODENOSCOPY (EGD) WITH PROPOFOL ;  Surgeon: Suzette Espy, MD; normal esophagus s/p empiric dilation, abnormal appearing gastric mucosa s/p biopsy, abnormal duodenal mucosa s/p biopsied, no ulcer, infiltrating process, or portal gastropathy.  Duodenal biopsies benign.  Gastric biopsy with reactive gastropathy with erosions, no H. pylori.    ESOPHAGOGASTRODUODENOSCOPY (EGD) WITH PROPOFOL  N/A 09/27/2021   Procedure: ESOPHAGOGASTRODUODENOSCOPY (EGD) WITH PROPOFOL ;  Surgeon: Vinetta Greening, DO;  [facility];  Service: Endoscopy;  Laterality: N/A;   MALONEY DILATION N/A 11/04/2019   Procedure: Londa Rival DILATION;  Surgeon: Suzette Espy, MD;  Location: [facility];  Service: Endoscopy;  Laterality: N/A;   TILT TABLE STUDY  2005   [facility]   TRIGGER FINGER RELEASE Right    2nd finger    Prior to Admission medications   Medication Sig Start Date End Date Taking? Authorizing Provider  atorvastatin (LIPITOR) 10 MG tablet Take 10 mg by mouth every evening.    Yes [provider]  Ferrous Sulfate (IRON) 325 (65 Fe)  MG TABS Take 325 mg by mouth daily. With Vit C   Yes [provider]  hyoscyamine (LEVSIN SL) 0.125 MG SL tablet Place 0.125 mg under the tongue 3 (three) times daily as needed for cramping. 01/13/20  Yes [provider]  insulin lispro (HUMALOG) 100 UNIT/ML KwikPen Inject 3-5 Units into the skin in the morning and at bedtime. Per sliding scale   Yes [provider]  JARDIANCE 25 MG TABS tablet Take 25 mg by mouth  daily. 09/14/21  Yes [provider]  ketoconazole (NIZORAL) 2 % cream Apply 1 application topically daily. Groin area   Yes [provider]  levothyroxine (SYNTHROID) 75 MCG tablet Take 75 mcg by mouth daily. 11/16/23  Yes [provider]  meclizine (ANTIVERT) 12.5 MG tablet Take 12.5 mg by mouth 3 (three) times daily as needed. 11/03/23  Yes [provider]  Melatonin 10 MG TABS Take 10 mg by mouth at bedtime.    Yes [provider]  metFORMIN (GLUCOPHAGE-XR) 500 MG 24 hr tablet Take 500 mg by mouth 2 (two) times daily. 03/04/17  Yes [provider]  metoprolol succinate (TOPROL-XL) 100 MG 24 hr tablet Take 100 mg by mouth daily. *Hold for low blood pressure*   Yes [provider]  Multiple Vitamins-Minerals (PRESERVISION AREDS 2 PO) Take 1 tablet by mouth 2 (two) times daily.   Yes [provider]  omeprazole  (PRILOSEC) 40 MG capsule TAKE 1 CAPSULE BY MOUTH TWICE  DAILY BEFORE MEALS 04/10/23  Yes Jaslynne Dahan, Windsor Hatcher, MD  Kristine Rocha VERIO test strip  12/06/16  Yes [provider]  OVER THE COUNTER MEDICATION Take 1 capsule by mouth daily. Fruits and Vegetables   Yes [provider]  tiZANidine (ZANAFLEX) 4 MG tablet Take 4 mg by mouth 3 (three) times daily as needed for muscle spasms.  06/14/19  Yes [provider]  TRESIBA FLEXTOUCH 200 UNIT/ML FlexTouch Pen Inject 15 Units into the skin daily. 09/14/21  Yes [provider]  valsartan (DIOVAN) 40 MG tablet Take 40 mg by mouth daily. 09/04/21  Yes [provider]    Allergies as of 11/21/2023 - Review Complete 11/21/2023  Allergen Reaction Noted   Amoxicillin Hives, Shortness Of Breath, and Rash 11/19/2014   Ampicillin Hives, Shortness Of Breath, and Rash 11/19/2014   Methotrexate derivatives Other (See Comments) 02/01/2021   Apremilast  11/21/2023   Codeine  11/21/2023   Dust mite extract  10/28/2020   Molds & smuts  10/28/2020   Nsaids   09/17/2021   Other  11/19/2014   Prevacid [lansoprazole] Diarrhea 06/24/2019    Family History  Problem Relation Age of Onset   Heart attack Father        Age 44   Arrhythmia Sister        WPW s/p ablation- corrected at [facility]   Alzheimer's disease Mother    Pneumonia Mother        Aspiration   Heart attack Brother    Heart attack Maternal Grandfather    Colon cancer Maternal Aunt        diagnosed at 83   Breast cancer Maternal Grandmother     Social History   Socioeconomic History   Marital status: Married    Spouse name: Not on file   Number of children: Not on file   Years of education: Not on file   Highest education level: Not on file  Occupational History   Not on file  Tobacco Use  Smoking status: Never   Smokeless tobacco: Never  Vaping Use   Vaping status: Never Used  Substance and Sexual Activity   Alcohol use: No    Alcohol/week: 0.0 standard drinks of alcohol   Drug use: No   Sexual activity: Yes  Other Topics Concern   Not on file  Social History Narrative   Not on file   Social Drivers of Health   Financial Resource Strain: Low Risk  (03/24/2023)   Overall Financial Resource Strain (CARDIA)    Difficulty of Paying Living Expenses: Not very hard  Food Insecurity: No Food Insecurity (03/24/2023)   Hunger Vital Sign    Worried About Running Out of Food in the Last Year: Never true    Ran Out of Food in the Last Year: Never true  Transportation Needs: No Transportation Needs (03/24/2023)   PRAPARE - Administrator, Civil Service (Medical): No    Lack of Transportation (Non-Medical): No  Physical Activity: Inactive (03/24/2023)   Exercise Vital Sign    Days of Exercise per Week: 0 days    Minutes of Exercise per Session: 0 min  Stress: No Stress Concern Present (03/24/2023)   [employer] of Occupational Health - Occupational Stress Questionnaire    Feeling of Stress : Not at all  Social Connections: Moderately Isolated  (03/24/2023)   Social Connection and Isolation Panel [NHANES]    Frequency of Communication with Friends and Family: More than three times a week    Frequency of Social Gatherings with Friends and Family: More than three times a week    Attends Religious Services: Never    Database administrator or Organizations: No    Attends Engineer, structural: Never    Marital Status: Married  Catering manager Violence: Not on file    Review of Systems: See HPI, otherwise negative ROS  Physical Exam: BP 136/62 (BP Location: Left Arm, Patient Position: Sitting, Cuff Size: Large)   Pulse 69   Temp 98.5 F (36.9 C) (Oral)   Ht 5' 0.5" (1.537 m)   Wt 175 lb 9.6 oz (79.7 kg)   SpO2 99%   BMI 33.73 kg/m  General:   Alert,  Well-developed, well-nourished, pleasant and cooperative in NAD Neck:  Supple; no masses or thyromegaly. No significant cervical adenopathy. Lungs:  Clear throughout to auscultation.   No wheezes, crackles, or rhonchi. No acute distress. Heart:  Regular rate and rhythm; no murmurs, clicks, rubs,  or gallops. Abdomen: Non-distended, normal bowel sounds.  Soft and nontender without appreciable mass or hepatosplenomegaly.   Impression/Plan: Pleasant 68 year old lady with Maslin/cirrhosis here for follow-up of persisting iron deficiency anemia iron deficiency goes back at least a few years.  Colonoscopy incomplete due to a poor prep 2023; she was invited back to reprep.  This did not happen.  Non-H. pylori gastritis on prior EGD.  No esophageal varices or stigmata of portal hypertension.  She also endorses intermittent esophageal dysphagia.  Based on [facility] labs hemoglobin is stable also has a history of markedly elevated alpha-fetoprotein a few years back but it normalized last year.  She is overdue for liver imaging.  Recommendations:  I have offered the patient both an EGD and Colonoscopy in the near future.  Possible esophageal dilation.  The risks, benefits,  limitations, imponderables and alternatives regarding both EGD and colonoscopy have been reviewed with the patient. Questions have been answered. All parties agreeable.    Patient will strive for an adequate preparation.  No  iron for 5 days prior to the procedure.   Diabetic medications managed per protocol.  Need an updated AFP and hepatic ultrasound       Notice: This dictation was prepared with Dragon dictation along with smaller phrase technology. Any transcriptional errors that result from this process are unintentional and may not be corrected upon review.

## 2023-11-21 NOTE — Patient Instructions (Addendum)
 It was good to see you again today!  As discussed, you need to have a repeat EGD with possible esophageal dilation because of dysphagia and to evaluate iron deficiency anemia, check for esophageal varices, etc.  At the same time, we will perform a diagnostic colonoscopy for iron deficiency.  ASA 3.  It very important to undergo adequate preparation so you are cleaned out.  Stop iron for 5 days prior to the procedure  Diabetic medications managed per protocol  Serum alpha-fetoprotein and screening hepatic ultrasound  Further recommendations to follow.

## 2023-11-22 LAB — AFP TUMOR MARKER: AFP, Serum, Tumor Marker: 3.3 ng/mL (ref 0.0–9.2)

## 2023-11-28 NOTE — Telephone Encounter (Signed)
 Called pt and she stated she will call back to schedule later today.  TCS/EGD w/ Dr. Riley Cheadle, ASA 3

## 2023-11-30 DIAGNOSIS — E113293 Type 2 diabetes mellitus with mild nonproliferative diabetic retinopathy without macular edema, bilateral: Secondary | ICD-10-CM | POA: Diagnosis not present

## 2023-11-30 DIAGNOSIS — H35372 Puckering of macula, left eye: Secondary | ICD-10-CM | POA: Diagnosis not present

## 2023-11-30 DIAGNOSIS — H353212 Exudative age-related macular degeneration, right eye, with inactive choroidal neovascularization: Secondary | ICD-10-CM | POA: Diagnosis not present

## 2023-11-30 DIAGNOSIS — H353114 Nonexudative age-related macular degeneration, right eye, advanced atrophic with subfoveal involvement: Secondary | ICD-10-CM | POA: Diagnosis not present

## 2023-11-30 DIAGNOSIS — H353122 Nonexudative age-related macular degeneration, left eye, intermediate dry stage: Secondary | ICD-10-CM | POA: Diagnosis not present

## 2023-12-05 ENCOUNTER — Ambulatory Visit (HOSPITAL_COMMUNITY)
Admission: RE | Admit: 2023-12-05 | Discharge: 2023-12-05 | Disposition: A | Discharge status: Home / Self Care | Source: Ambulatory Visit | Attending: Internal Medicine | Admitting: Internal Medicine

## 2023-12-05 DIAGNOSIS — R772 Abnormality of alphafetoprotein: Secondary | ICD-10-CM | POA: Diagnosis not present

## 2023-12-05 DIAGNOSIS — Z8719 Personal history of other diseases of the digestive system: Secondary | ICD-10-CM | POA: Insufficient documentation

## 2023-12-05 DIAGNOSIS — R748 Abnormal levels of other serum enzymes: Secondary | ICD-10-CM | POA: Diagnosis not present

## 2023-12-05 DIAGNOSIS — K7689 Other specified diseases of liver: Secondary | ICD-10-CM | POA: Diagnosis not present

## 2023-12-05 DIAGNOSIS — R7989 Other specified abnormal findings of blood chemistry: Secondary | ICD-10-CM | POA: Diagnosis not present

## 2023-12-06 ENCOUNTER — Ambulatory Visit: Payer: Self-pay | Admitting: Internal Medicine

## 2023-12-06 NOTE — Telephone Encounter (Signed)
 Pt stated she will call back to schedule

## 2023-12-07 DIAGNOSIS — E119 Type 2 diabetes mellitus without complications: Secondary | ICD-10-CM | POA: Diagnosis not present

## 2023-12-07 MED ORDER — PEG 3350-KCL-NA BICARB-NACL 420 G PO SOLR: 4000.0000 mL | Freq: Once | ORAL | 0 refills | Status: AC

## 2023-12-07 NOTE — Addendum Note (Signed)
 Addended by: Feliz Hosteller on: 12/07/2023 01:32 PM   Modules accepted: Orders

## 2023-12-07 NOTE — Telephone Encounter (Signed)
 Spoke with pt. She has been scheduled for 7/2 at 9:45am with Dr. Riley Cheadle. Aware will send instructions/pre-op appt to her. Rx for prep to be sent to pharmacy.

## 2023-12-11 DIAGNOSIS — M1711 Unilateral primary osteoarthritis, right knee: Secondary | ICD-10-CM | POA: Diagnosis not present

## 2023-12-12 ENCOUNTER — Encounter: Payer: Self-pay | Admitting: *Deleted

## 2024-01-01 DIAGNOSIS — Z299 Encounter for prophylactic measures, unspecified: Secondary | ICD-10-CM | POA: Diagnosis not present

## 2024-01-01 DIAGNOSIS — Z0000 Encounter for general adult medical examination without abnormal findings: Secondary | ICD-10-CM | POA: Diagnosis not present

## 2024-01-01 DIAGNOSIS — E039 Hypothyroidism, unspecified: Secondary | ICD-10-CM | POA: Diagnosis not present

## 2024-01-01 DIAGNOSIS — I10 Essential (primary) hypertension: Secondary | ICD-10-CM | POA: Diagnosis not present

## 2024-01-01 DIAGNOSIS — Z79899 Other long term (current) drug therapy: Secondary | ICD-10-CM | POA: Diagnosis not present

## 2024-01-01 DIAGNOSIS — E7800 Pure hypercholesterolemia, unspecified: Secondary | ICD-10-CM | POA: Diagnosis not present

## 2024-01-01 DIAGNOSIS — R5383 Other fatigue: Secondary | ICD-10-CM | POA: Diagnosis not present

## 2024-01-08 DIAGNOSIS — H353114 Nonexudative age-related macular degeneration, right eye, advanced atrophic with subfoveal involvement: Secondary | ICD-10-CM | POA: Diagnosis not present

## 2024-01-08 DIAGNOSIS — E113293 Type 2 diabetes mellitus with mild nonproliferative diabetic retinopathy without macular edema, bilateral: Secondary | ICD-10-CM | POA: Diagnosis not present

## 2024-01-08 DIAGNOSIS — H353122 Nonexudative age-related macular degeneration, left eye, intermediate dry stage: Secondary | ICD-10-CM | POA: Diagnosis not present

## 2024-01-08 DIAGNOSIS — H35372 Puckering of macula, left eye: Secondary | ICD-10-CM | POA: Diagnosis not present

## 2024-01-19 NOTE — Patient Instructions (Signed)
 Kristine Rocha  01/19/2024     @PREFPERIOPPHARMACY @   Your procedure is scheduled on 01/24/2024.   Report to [facility] at 8:05 A.M.   Call this number if you have problems the morning of surgery:   [phone]  If you experience any cold or flu symptoms such as cough, fever, chills, shortness of breath, etc. between now and your scheduled surgery, please notify us  at the above number.   Remember:   Please Follow the diet and prep instructions given to you by Dr Kristine Rocha office.   You may drink clear liquids until 6:45 am .  Clear liquids allowed are:                     Water, Juice (No red color; non-citric and without pulp; diabetics please choose diet or no sugar options), Carbonated beverages (diabetics please choose diet or no sugar options), Clear Tea (No creamer, milk, or cream, including half & half and powdered creamer), Black Coffee Only (No creamer, milk or cream, including half & half and powdered creamer), Plain Jell-O Only (No red color; diabetics please choose no sugar options), Clear Sports drink (No red color; diabetics please choose diet or no sugar options), and Plain Popsicles Only (No red color; diabetics please choose no sugar options)    Take these medicines the morning of surgery with A SIP OF WATER : Synthroid Antivert Metoprolol Prilosec and Zanaflex    Last dose of Jardiance should be on 01/20/2024.   Take 1/2 of your [place] the night before the procedure.    Do not take any diabetic medications the am of the procedure.     Do not wear jewelry, make-up or nail polish, including gel polish,  artificial nails, or any other type of covering on natural nails (fingers and  toes).  Do not wear lotions, powders, or perfumes, or deodorant.  Do not shave 48 hours prior to surgery.  Men may shave face and neck.  Do not bring valuables to the hospital.  [facility] is not responsible for any belongings or valuables.  Contacts, dentures or bridgework may not be  worn into surgery.  Leave your suitcase in the car.  After surgery it may be brought to your room.  For patients admitted to the hospital, discharge time will be determined by your treatment team.  Patients discharged the day of surgery will not be allowed to drive home.   Name and phone number of your driver:   family  Special instructions:  N/A  Please read over the following fact sheets that you were given.  Care and Recovery After Surgery    Colonoscopy, Adult A colonoscopy is a procedure to look at the entire large intestine. This procedure is done using a long, thin, flexible tube that has a camera on the end. You may have a colonoscopy: As a part of normal colorectal screening. If you have certain symptoms, such as: A low number of red blood cells in your blood (anemia). Diarrhea that does not go away. Pain in your abdomen. Blood in your stool. A colonoscopy can help screen for and diagnose medical problems, including: An abnormal growth of cells or tissue (tumor). Abnormal growths within the lining of your intestine (polyps). Inflammation. Areas of bleeding. Tell your health care provider about: Any allergies you have. All medicines you are taking, including vitamins, herbs, eye drops, creams, and over-the-counter medicines. Any problems you or family members have had with anesthetic medicines.  Any bleeding problems you have. Any surgeries you have had. Any medical conditions you have. Any problems you have had with having bowel movements. Whether you are pregnant or may be pregnant. What are the risks? Generally, this is a safe procedure. However, problems may occur, including: Bleeding. Damage to your intestine. Allergic reactions to medicines given during the procedure. Infection. This is rare. What happens before the procedure? Eating and drinking restrictions Follow instructions from your health care provider about eating or drinking restrictions, which may  include: A few days before the procedure: Follow a low-fiber diet. Avoid nuts, seeds, dried fruit, raw fruits, and vegetables. 1-3 days before the procedure: Eat only gelatin dessert or ice pops. Drink only clear liquids, such as water, clear juice, clear broth or bouillon, black coffee or tea, or clear soft drinks or sports drinks. Avoid liquids that contain red or purple dye. The day of the procedure: Do not eat solid foods. You may continue to drink clear liquids until up to 2 hours before the procedure. Do not eat or drink anything starting 2 hours before the procedure, or within the time period that your health care provider recommends. Bowel prep If you were prescribed a bowel prep to take by mouth (orally) to clean out your colon: Take it as told by your health care provider. Starting the day before your procedure, you will need to drink a large amount of liquid medicine. The liquid will cause you to have many bowel movements of loose stool until your stool becomes almost clear or light green. If your skin or the opening between the buttocks (anus) gets irritated from diarrhea, you may relieve the irritation using: Wipes with medicine in them, such as adult wet wipes with aloe and vitamin E. A product to soothe skin, such as petroleum jelly. If you vomit while drinking the bowel prep: Take a break for up to 60 minutes. Begin the bowel prep again. Call your health care provider if you keep vomiting or you cannot take the bowel prep without vomiting. To clean out your colon, you may also be given: Laxative medicines. These help you have a bowel movement. Instructions for enema use. An enema is liquid medicine injected into your rectum. Medicines Ask your health care provider about: Changing or stopping your regular medicines or supplements. This is especially important if you are taking iron supplements, diabetes medicines, or blood thinners. Taking medicines such as aspirin and  ibuprofen. These medicines can thin your blood. Do not take these medicines unless your health care provider tells you to take them. Taking over-the-counter medicines, vitamins, herbs, and supplements. General instructions Ask your health care provider what steps will be taken to help prevent infection. These may include washing skin with a germ-killing soap. If you will be going home right after the procedure, plan to have a responsible adult: Take you home from the hospital or clinic. You will not be allowed to drive. Care for you for the time you are told. What happens during the procedure?  An IV will be inserted into one of your veins. You will be given a medicine to make you fall asleep (general anesthetic). You will lie on your side with your knees bent. A lubricant will be put on the tube. Then the tube will be: Inserted into your anus. Gently eased through all parts of your large intestine. Air will be sent into your colon to keep it open. This may cause some pressure or cramping. Images will be taken  with the camera and will appear on a screen. A small tissue sample may be removed to be looked at under a microscope (biopsy). The tissue may be sent to a lab for testing if any signs of problems are found. If small polyps are found, they may be removed and checked for cancer cells. When the procedure is finished, the tube will be removed. The procedure may vary among health care providers and hospitals. What happens after the procedure? Your blood pressure, heart rate, breathing rate, and blood oxygen level will be monitored until you leave the hospital or clinic. You may have a small amount of blood in your stool. You may pass gas and have mild cramping or bloating in your abdomen. This is caused by the air that was used to open your colon during the exam. If you were given a sedative during the procedure, it can affect you for several hours. Do not drive or operate machinery until  your health care provider says that it is safe. It is up to you to get the results of your procedure. Ask your health care provider, or the department that is doing the procedure, when your results will be ready. Summary A colonoscopy is a procedure to look at the entire large intestine. Follow instructions from your health care provider about eating and drinking before the procedure. If you were prescribed an oral bowel prep to clean out your colon, take it as told by your health care provider. During the colonoscopy, a flexible tube with a camera on its end is inserted into the anus and then passed into all parts of the large intestine. This information is not intended to replace advice given to you by your health care provider. Make sure you discuss any questions you have with your health care provider. Document Revised: 08/23/2022 Document Reviewed: 03/03/2021 Elsevier Patient Education  2024 [employer]  Upper Endoscopy, Adult Upper endoscopy is a procedure to look inside the upper GI (gastrointestinal) tract. The upper GI tract is made up of: The esophagus. This is the part of the body that moves food from your mouth to your stomach. The stomach. The duodenum. This is the first part of your small intestine. This procedure is also called esophagogastroduodenoscopy (EGD) or gastroscopy. In this procedure, your health care provider passes a thin, flexible tube (endoscope) through your mouth and down your esophagus into your stomach and into your duodenum. A small camera is attached to the end of the tube. Images from the camera appear on a monitor in the exam room. During this procedure, your health care provider may also remove a small piece of tissue to be sent to a lab and examined under a microscope (biopsy). Your health care provider may do an upper endoscopy to diagnose cancers of the upper GI tract. You may also have this procedure to find the cause of other conditions, such  as: Stomach pain. Heartburn. Pain or problems when swallowing. Nausea and vomiting. Stomach bleeding. Stomach ulcers. Tell a health care provider about: Any allergies you have. All medicines you are taking, including vitamins, herbs, eye drops, creams, and over-the-counter medicines. Any problems you or family members have had with anesthetic medicines. Any bleeding problems you have. Any surgeries you have had. Any medical conditions you have. Whether you are pregnant or may be pregnant. What are the risks? Your healthcare provider will talk with you about risks. These may include: Infection. Bleeding. Allergic reactions to medicines. A tear or hole (perforation) in the  esophagus, stomach, or duodenum. What happens before the procedure? When to stop eating and drinking Follow instructions from your health care provider about what you may eat and drink. These may include: 8 hours before your procedure Stop eating most foods. Do not eat meat, fried foods, or fatty foods. Eat only light foods, such as toast or crackers. All liquids are okay except energy drinks and alcohol. 6 hours before your procedure Stop eating. Drink only clear liquids, such as water, clear fruit juice, black coffee, plain tea, and sports drinks. Do not drink energy drinks or alcohol. 2 hours before your procedure Stop drinking all liquids. You may be allowed to take medicines with small sips of water. If you do not follow your health care provider's instructions, your procedure may be delayed or canceled. Medicines Ask your health care provider about: Changing or stopping your regular medicines. This is especially important if you are taking diabetes medicines or blood thinners. Taking medicines such as aspirin and ibuprofen. These medicines can thin your blood. Do not take these medicines unless your health care provider tells you to take them. Taking over-the-counter medicines, vitamins, herbs, and  supplements. General instructions If you will be going home right after the procedure, plan to have a responsible adult: Take you home from the hospital or clinic. You will not be allowed to drive. Care for you for the time you are told. What happens during the procedure?  An IV will be inserted into one of your veins. You may be given one or more of the following: A medicine to help you relax (sedative). A medicine to numb the throat (local anesthetic). You will lie on your left side on an exam table. Your health care provider will pass the endoscope through your mouth and down your esophagus. Your health care provider will use the scope to check the inside of your esophagus, stomach, and duodenum. Biopsies may be taken. The endoscope will be removed. The procedure may vary among health care providers and hospitals. What happens after the procedure? Your blood pressure, heart rate, breathing rate, and blood oxygen level will be monitored until you leave the hospital or clinic. When your throat is no longer numb, you may be given some fluids to drink. If you were given a sedative during the procedure, it can affect you for several hours. Do not drive or operate machinery until your health care provider says that it is safe. It is up to you to get the results of your procedure. Ask your health care provider, or the department that is doing the procedure, when your results will be ready. Contact a health care provider if you: Have a sore throat that lasts longer than 1 day. Have a fever. Get help right away if you: Vomit blood or your vomit looks like coffee grounds. Have bloody, black, or tarry stools. Have a very bad sore throat or you cannot swallow. Have difficulty breathing or very bad pain in your chest or abdomen. These symptoms may be an emergency. Get help right away. Call 911. Do not wait to see if the symptoms will go away. Do not drive yourself to the  hospital. Summary Upper endoscopy is a procedure to look inside the upper GI tract. During the procedure, an IV will be inserted into one of your veins. You may be given a medicine to help you relax. The endoscope will be passed through your mouth and down your esophagus. Follow instructions from your health care provider about what  you can eat and drink. This information is not intended to replace advice given to you by your health care provider. Make sure you discuss any questions you have with your health care provider. Document Revised: 10/20/2021 Document Reviewed: 10/20/2021 Elsevier Patient Education  2024 [employer]  Monitored Anesthesia Care Anesthesia refers to the techniques, procedures, and medicines that help a person stay safe and comfortable during surgery. Monitored anesthesia care, or sedation, is one type of anesthesia. You may have sedation if you do not need to be asleep for your procedure. Procedures that use sedation may include: Surgery to remove cataracts from your eyes. A dental procedure. A biopsy. This is when a tissue sample is removed and looked at under a microscope. You will be watched closely during your procedure. Your level of sedation or type of anesthesia may be changed to fit your needs. Tell a health care provider about: Any allergies you have. All medicines you are taking, including vitamins, herbs, eye drops, creams, and over-the-counter medicines. Any problems you or family members have had with anesthesia. Any bleeding problems you have. Any surgeries you have had. Any medical conditions or illnesses you have. This includes sleep apnea, cough, fever, or the flu. Whether you are pregnant or may be pregnant. Whether you use cigarettes, alcohol, or drugs. Any use of steroids, whether by mouth or as a cream. What are the risks? Your health care provider will talk with you about risks. These may include: Getting too much medicine  (oversedation). Nausea. Allergic reactions to medicines. Trouble breathing. If this happens, a breathing tube may be used to help you breathe. It will be removed when you are awake and breathing on your own. Heart trouble. Lung trouble. Confusion that gets better with time (emergence delirium). What happens before the procedure? When to stop eating and drinking Follow instructions from your health care provider about what you may eat and drink. These may include: 8 hours before your procedure Stop eating most foods. Do not eat meat, fried foods, or fatty foods. Eat only light foods, such as toast or crackers. All liquids are okay except energy drinks and alcohol. 6 hours before your procedure Stop eating. Drink only clear liquids, such as water, clear fruit juice, black coffee, plain tea, and sports drinks. Do not drink energy drinks or alcohol. 2 hours before your procedure Stop drinking all liquids. You may be allowed to take medicines with small sips of water. If you do not follow your health care provider's instructions, your procedure may be delayed or canceled. Medicines Ask your health care provider about: Changing or stopping your regular medicines. These include any diabetes medicines or blood thinners you take. Taking medicines such as aspirin and ibuprofen. These medicines can thin your blood. Do not take them unless your health care provider tells you to. Taking over-the-counter medicines, vitamins, herbs, and supplements. Testing You may have an exam or testing. You may have a blood or urine sample taken. General instructions Do not use any products that contain nicotine or tobacco for at least 4 weeks before the procedure. These products include cigarettes, chewing tobacco, and vaping devices, such as e-cigarettes. If you need help quitting, ask your health care provider. If you will be going home right after the procedure, plan to have a responsible adult: Take you  home from the hospital or clinic. You will not be allowed to drive. Care for you for the time you are told. What happens during the procedure?  Your blood pressure,  heart rate, breathing, level of pain, and blood oxygen level will be monitored. An IV will be inserted into one of your veins. You may be given: A sedative. This helps you relax. Anesthesia. This will: Numb certain areas of your body. Make you fall asleep for surgery. You will be given medicines as needed to keep you comfortable. The more medicine you are given, the deeper your level of sedation will be. Your level of sedation may be changed to fit your needs. There are three levels of sedation: Mild sedation. At this level, you may feel awake and relaxed. You will be able to follow directions. Moderate sedation. At this level, you will be sleepy. You may not remember the procedure. Deep sedation. At this level, you will be asleep. You will not remember the procedure. How you get the medicines will depend on your age and the procedure. They may be given as: A pill. This may be taken by mouth (orally) or inserted into the rectum. An injection. This may be into a vein or muscle. A spray through the nose. After your procedure is over, the medicine will be stopped. The procedure may vary among health care providers and hospitals. What happens after the procedure? Your blood pressure, heart rate, breathing rate, and blood oxygen level will be monitored until you leave the hospital or clinic. You may feel sleepy, clumsy, or nauseous. You may not remember what happened during or after the procedure. Sedation can affect you for several hours. Do not drive or use machinery until your health care provider says that it is safe. This information is not intended to replace advice given to you by your health care provider. Make sure you discuss any questions you have with your health care provider. Document Revised: 12/06/2021 Document  Reviewed: 12/06/2021 Elsevier Patient Education  2024 [employer].

## 2024-01-22 ENCOUNTER — Telehealth: Payer: Self-pay | Admitting: *Deleted

## 2024-01-22 ENCOUNTER — Encounter (HOSPITAL_COMMUNITY): Payer: Self-pay

## 2024-01-22 ENCOUNTER — Encounter (HOSPITAL_COMMUNITY)
Admission: RE | Admit: 2024-01-22 | Discharge: 2024-01-22 | Disposition: A | Discharge status: Home / Self Care | Source: Ambulatory Visit | Attending: Internal Medicine | Admitting: Internal Medicine

## 2024-01-22 DIAGNOSIS — I10 Essential (primary) hypertension: Secondary | ICD-10-CM

## 2024-01-22 DIAGNOSIS — K7030 Alcoholic cirrhosis of liver without ascites: Secondary | ICD-10-CM

## 2024-01-22 DIAGNOSIS — E0800 Diabetes mellitus due to underlying condition with hyperosmolarity without nonketotic hyperglycemic-hyperosmolar coma (NKHHC): Secondary | ICD-10-CM

## 2024-01-22 DIAGNOSIS — K7460 Unspecified cirrhosis of liver: Secondary | ICD-10-CM

## 2024-01-22 MED ORDER — PEG 3350-KCL-NA BICARB-NACL 420 G PO SOLR: 4000.0000 mL | Freq: Once | ORAL | 0 refills | Status: AC

## 2024-01-22 NOTE — Pre-Procedure Instructions (Signed)
 Patient took her jardiance this morning, 01/22/2024 and will need to be rescheduled. Office and OR scheduler aware.

## 2024-01-22 NOTE — Addendum Note (Signed)
 Addended by: JEANELL GRAEME RAMAN on: 01/22/2024 02:43 PM   Modules accepted: Orders

## 2024-01-22 NOTE — Telephone Encounter (Signed)
 Pt called back and said she can do 7/21. Advised it was an evening procedure and she was fine with this. New instructions sent. New rx for prep and will call back with new pre-op appt.

## 2024-01-22 NOTE — Telephone Encounter (Signed)
 Spoke with patient. Offered dates available and she could not do those dates. Advised will call with August schedule

## 2024-01-22 NOTE — Telephone Encounter (Signed)
Pt aware of pre-op appt details

## 2024-01-22 NOTE — Telephone Encounter (Signed)
 Per [name] in endo Good morning! We will need to reschedule [facility]. She took her jardiance this morning.  Patient is scheduled for 01/24/24.   Called pt, she will call back once she looks at her calendar

## 2024-01-24 ENCOUNTER — Other Ambulatory Visit: Payer: Self-pay | Admitting: Internal Medicine

## 2024-01-24 DIAGNOSIS — K219 Gastro-esophageal reflux disease without esophagitis: Secondary | ICD-10-CM

## 2024-02-08 NOTE — Patient Instructions (Signed)
 Kristine Rocha  02/08/2024     @PREFPERIOPPHARMACY @   Your procedure is scheduled on 02/12/2024.    Report to [name] at  1145 A.M.   Call this number if you have problems the morning of surgery:  [phone]  If you experience any cold or flu symptoms such as cough, fever, chills, shortness of breath, etc. between now and your scheduled surgery, please notify us  at the above number.   Remember:         Your last dose of jardiance should be on 02/08/2024.        Take 1/2 of your usual night time insulin the night before your procedure.         DO NOT take any medications for diabetes the morning of your procedure.   Follow the diet and prep instructions given to you by the office.    You may drink clear liquids until  0945 am on 02/12/2024.   Clear liquids allowed are:                    Water, Juice (No red color; non-citric and without pulp; diabetics please choose diet or no sugar options), Carbonated beverages (diabetics please choose diet or no sugar options), Clear Tea (No creamer, milk, or cream, including half & half and powdered creamer), Black Coffee Only (No creamer, milk or cream, including half & half and powdered creamer), and Clear Sports drink (No red color; diabetics please choose diet or no sugar options)    Take these medicines the morning of surgery with A SIP OF WATER        levothyroxine, metoprolol, omeprazole , tizanidine(if needed)    Do not wear jewelry, make-up or nail polish, including gel polish,  artificial nails, or any other type of covering on natural nails (fingers and  toes).  Do not wear lotions, powders, or perfumes, or deodorant.  Do not shave 48 hours prior to surgery.  Men may shave face and neck.  Do not bring valuables to the hospital.  [facility] is not responsible for any belongings or valuables.  Contacts, dentures or bridgework may not be worn into surgery.  Leave your suitcase in the car.  After surgery it may  be brought to your room.  For patients admitted to the hospital, discharge time will be determined by your treatment team.  Patients discharged the day of surgery will not be allowed to drive home and must have someone with them for 24 hours.    Special instructions:   DO NOT smoke tobacco or vape for 24 hours before your procedure.  Please read over the following fact sheets that you were given. Anesthesia Post-op Instructions and Care and Recovery After Surgery      Upper Endoscopy, Adult, Care After After the procedure, it is common to have a sore throat. It is also common to have: Mild stomach pain or discomfort. Bloating. Nausea. Follow these instructions at home: The instructions below may help you care for yourself at home. Your health care provider may give you more instructions. If you have questions, ask your health care provider. If you were given a sedative during the procedure, it can affect you for several hours. Do not drive or operate machinery until your health care provider says that it is safe. If you will be going home right after the procedure, plan to have a responsible adult: Take you home from the hospital  or clinic. You will not be allowed to drive. Care for you for the time you are told. Follow instructions from your health care provider about what you may eat and drink. Return to your normal activities as told by your health care provider. Ask your health care provider what activities are safe for you. Take over-the-counter and prescription medicines only as told by your health care provider. Contact a health care provider if you: Have a sore throat that lasts longer than one day. Have trouble swallowing. Have a fever. Get help right away if you: Vomit blood or your vomit looks like coffee grounds. Have bloody, black, or tarry stools. Have a very bad sore throat or you cannot swallow. Have difficulty breathing or very bad pain in your chest or  abdomen. These symptoms may be an emergency. Get help right away. Call 911. Do not wait to see if the symptoms will go away. Do not drive yourself to the hospital. Summary After the procedure, it is common to have a sore throat, mild stomach discomfort, bloating, and nausea. If you were given a sedative during the procedure, it can affect you for several hours. Do not drive until your health care provider says that it is safe. Follow instructions from your health care provider about what you may eat and drink. Return to your normal activities as told by your health care provider. This information is not intended to replace advice given to you by your health care provider. Make sure you discuss any questions you have with your health care provider. Document Revised: 10/20/2021 Document Reviewed: 10/20/2021 Elsevier Patient Education  2024 [employer].Esophageal Dilatation Esophageal dilatation, or dilation, is done to stretch a blocked or narrowed part of your esophagus. The esophagus is the part of your body that moves food from your mouth to your stomach. You may need to have it stretched if: You have a lot of scar tissue and it makes it hard or painful to swallow. You have cancer of the esophagus. There's a problem with how food moves through your esophagus. In some cases, you may need to have this procedure done more than once. Tell a health care provider about: Any allergies you have. All medicines you're taking, including vitamins, herbs, eye drops, creams, and over-the-counter medicines. Any problems you or family members have had with anesthesia. Any bleeding problems you have. Any surgeries you've had. Any medical conditions you have. Whether you're pregnant or may be pregnant. What are the risks? Your health care provider will talk with you about risks. These may include: Bleeding. A hole or tear in your esophagus. What happens before the procedure? When to stop eating and  drinking Follow instructions from your provider about what you may eat and drink. These may include: 8 hours before your procedure Stop eating most foods. Do not eat meat, fried foods, or fatty foods. Eat only light foods, such as toast or crackers. All liquids are okay except energy drinks and alcohol. 6 hours before your procedure Stop eating. Drink only clear liquids, such as water, clear fruit juice, black coffee, plain tea, and sports drinks. Do not drink energy drinks or alcohol. 2 hours before your procedure Stop drinking all liquids. You may be allowed to take medicines with small sips of water. If you don't follow your provider's instructions, your procedure may be delayed or canceled. Medicines Ask your provider about: Changing or stopping your regular medicines. These include any diabetes medicines or blood thinners you take. Taking medicines such  as aspirin and ibuprofen. These medicines can thin your blood. Do not take them unless your provider tells you to. Taking over-the-counter medicines, vitamins, herbs, and supplements. General instructions If you'll be going home right after the procedure, plan to have a responsible adult: Take you home from the hospital or clinic. You won't be allowed to drive. Care for you for the time you're told. What happens during the procedure? You may be given: A sedative. This helps you relax. Anesthesia. This keeps you from feeling pain. It will numb certain areas of your body. The stretching may be done with: Simple dilators. These are tools put in your esophagus to stretch it. Guide wires. These wires are put in using a tube called an endoscope. A dilator is put over the wires to stretch your esophagus. Then the wires are taken out. A balloon. The balloon is on the end of a tube. It's inflated to stretch your esophagus. The procedure may vary among providers and hospitals. What can I expect after the procedure? Your blood pressure,  heart rate, breathing rate, and blood oxygen level will be monitored until you leave the hospital or clinic. Your throat may feel sore and numb. This will get better over time. You won't be allowed to eat or drink until your throat is no longer numb. You may be able to go home when you can: Drink. Pee. Sit on the edge of the bed without nausea or dizziness. Follow these instructions at home: Activity If you were given a sedative during the procedure, it can affect you for several hours. Do not drive or operate machinery until your provider says it's safe. Return to your normal activities as told by your provider. Ask your provider what activities are safe for you. General instructions Take over-the-counter and prescription medicines only as told by your provider. Follow instructions from your provider about what you may eat and drink. Do not use any products that contain nicotine or tobacco. These products include cigarettes, chewing tobacco, and vaping devices, such as e-cigarettes. If you need help quitting, ask your provider. Keep all follow-up visits. Your provider will make sure the procedure worked. Where to find more information American Society for Gastrointestinal Endoscopy (ASGE): asge.org Contact a health care provider if: You have trouble swallowing. You have a fever. Your pain doesn't get better with medicine. Get help right away if: You have chest pain. You have trouble breathing. You vomit blood. Your poop is: Black. Tarry. Bloody. These symptoms may be an emergency. Get help right away. Call 911. Do not wait to see if the symptoms will go away. Do not drive yourself to the hospital. This information is not intended to replace advice given to you by your health care provider. Make sure you discuss any questions you have with your health care provider. Document Revised: 10/07/2022 Document Reviewed: 10/07/2022 Elsevier Patient Education  2024 Else[employer]  Inc.Colonoscopy, Adult, Care After The following information offers guidance on how to care for yourself after your procedure. Your health care provider may also give you more specific instructions. If you have problems or questions, contact your health care provider. What can I expect after the procedure? After the procedure, it is common to have: A small amount of blood in your stool for 24 hours after the procedure. Some gas. Mild cramping or bloating of your abdomen. Follow these instructions at home: Eating and drinking  Drink enough fluid to keep your urine pale yellow. Follow instructions from your health care provider about  eating or drinking restrictions. Resume your normal diet as told by your health care provider. Avoid heavy or fried foods that are hard to digest. Activity Rest as told by your health care provider. Avoid sitting for a long time without moving. Get up to take short walks every 1-2 hours. This is important to improve blood flow and breathing. Ask for help if you feel weak or unsteady. Return to your normal activities as told by your health care provider. Ask your health care provider what activities are safe for you. Managing cramping and bloating  Try walking around when you have cramps or feel bloated. If directed, apply heat to your abdomen as told by your health care provider. Use the heat source that your health care provider recommends, such as a moist heat pack or a heating pad. Place a towel between your skin and the heat source. Leave the heat on for 20-30 minutes. Remove the heat if your skin turns bright red. This is especially important if you are unable to feel pain, heat, or cold. You have a greater risk of getting burned. General instructions If you were given a sedative during the procedure, it can affect you for several hours. Do not drive or operate machinery until your health care provider says that it is safe. For the first 24 hours after the  procedure: Do not sign important documents. Do not drink alcohol. Do your regular daily activities at a slower pace than normal. Eat soft foods that are easy to digest. Take over-the-counter and prescription medicines only as told by your health care provider. Keep all follow-up visits. This is important. Contact a health care provider if: You have blood in your stool 2-3 days after the procedure. Get help right away if: You have more than a small spotting of blood in your stool. You have large blood clots in your stool. You have swelling of your abdomen. You have nausea or vomiting. You have a fever. You have increasing pain in your abdomen that is not relieved with medicine. These symptoms may be an emergency. Get help right away. Call 911. Do not wait to see if the symptoms will go away. Do not drive yourself to the hospital. Summary After the procedure, it is common to have a small amount of blood in your stool. You may also have mild cramping and bloating of your abdomen. If you were given a sedative during the procedure, it can affect you for several hours. Do not drive or operate machinery until your health care provider says that it is safe. Get help right away if you have a lot of blood in your stool, nausea or vomiting, a fever, or increased pain in your abdomen. This information is not intended to replace advice given to you by your health care provider. Make sure you discuss any questions you have with your health care provider. Document Revised: 08/23/2022 Document Reviewed: 03/03/2021 Elsevier Patient Education  2024 Elsevier Inc.General Anesthesia, Adult, Care After The following information offers guidance on how to care for yourself after your procedure. Your health care provider may also give you more specific instructions. If you have problems or questions, contact your health care provider. What can I expect after the procedure? After the procedure, it is common for  people to: Have pain or discomfort at the IV site. Have nausea or vomiting. Have a sore throat or hoarseness. Have trouble concentrating. Feel cold or chills. Feel weak, sleepy, or tired (fatigue). Have soreness and body aches.  These can affect parts of the body that were not involved in surgery. Follow these instructions at home: For the time period you were told by your health care provider:  Rest. Do not participate in activities where you could fall or become injured. Do not drive or use machinery. Do not drink alcohol. Do not take sleeping pills or medicines that cause drowsiness. Do not make important decisions or sign legal documents. Do not take care of children on your own. General instructions Drink enough fluid to keep your urine pale yellow. If you have sleep apnea, surgery and certain medicines can increase your risk for breathing problems. Follow instructions from your health care provider about wearing your sleep device: Anytime you are sleeping, including during daytime naps. While taking prescription pain medicines, sleeping medicines, or medicines that make you drowsy. Return to your normal activities as told by your health care provider. Ask your health care provider what activities are safe for you. Take over-the-counter and prescription medicines only as told by your health care provider. Do not use any products that contain nicotine or tobacco. These products include cigarettes, chewing tobacco, and vaping devices, such as e-cigarettes. These can delay incision healing after surgery. If you need help quitting, ask your health care provider. Contact a health care provider if: You have nausea or vomiting that does not get better with medicine. You vomit every time you eat or drink. You have pain that does not get better with medicine. You cannot urinate or have bloody urine. You develop a skin rash. You have a fever. Get help right away if: You have trouble  breathing. You have chest pain. You vomit blood. These symptoms may be an emergency. Get help right away. Call 911. Do not wait to see if the symptoms will go away. Do not drive yourself to the hospital. Summary After the procedure, it is common to have a sore throat, hoarseness, nausea, vomiting, or to feel weak, sleepy, or fatigue. For the time period you were told by your health care provider, do not drive or use machinery. Get help right away if you have difficulty breathing, have chest pain, or vomit blood. These symptoms may be an emergency. This information is not intended to replace advice given to you by your health care provider. Make sure you discuss any questions you have with your health care provider. Document Revised: 10/08/2021 Document Reviewed: 10/08/2021 Elsevier Patient Education  2024 [employer].

## 2024-02-09 ENCOUNTER — Encounter (HOSPITAL_COMMUNITY)
Admission: RE | Admit: 2024-02-09 | Discharge: 2024-02-09 | Disposition: A | Discharge status: Home / Self Care | Source: Ambulatory Visit | Attending: Internal Medicine | Admitting: Internal Medicine

## 2024-02-09 ENCOUNTER — Other Ambulatory Visit: Payer: Self-pay

## 2024-02-09 VITALS — BP 132/62 | HR 69 | Resp 18 | Ht 60.5 in | Wt 175.7 lb

## 2024-02-09 DIAGNOSIS — I10 Essential (primary) hypertension: Secondary | ICD-10-CM | POA: Insufficient documentation

## 2024-02-09 DIAGNOSIS — Z01812 Encounter for preprocedural laboratory examination: Secondary | ICD-10-CM | POA: Diagnosis present

## 2024-02-09 DIAGNOSIS — Z01818 Encounter for other preprocedural examination: Secondary | ICD-10-CM | POA: Insufficient documentation

## 2024-02-09 DIAGNOSIS — Z01810 Encounter for preprocedural cardiovascular examination: Secondary | ICD-10-CM | POA: Diagnosis present

## 2024-02-09 DIAGNOSIS — K7460 Unspecified cirrhosis of liver: Secondary | ICD-10-CM | POA: Insufficient documentation

## 2024-02-09 LAB — COMPREHENSIVE METABOLIC PANEL WITH GFR
ALT: 16 U/L (ref 0–44)
AST: 16 U/L (ref 15–41)
Albumin: 3.4 g/dL — ABNORMAL LOW (ref 3.5–5.0)
Alkaline Phosphatase: 110 U/L (ref 38–126)
Anion gap: 10 (ref 5–15)
BUN: 23 mg/dL (ref 8–23)
CO2: 20 mmol/L — ABNORMAL LOW (ref 22–32)
Calcium: 9 mg/dL (ref 8.9–10.3)
Chloride: 107 mmol/L (ref 98–111)
Creatinine, Ser: 1.06 mg/dL — ABNORMAL HIGH (ref 0.44–1.00)
GFR, Estimated: 57 mL/min — ABNORMAL LOW (ref >60)
Glucose, Bld: 116 mg/dL — ABNORMAL HIGH (ref 70–99)
Potassium: 4.2 mmol/L (ref 3.5–5.1)
Sodium: 137 mmol/L (ref 135–145)
Total Bilirubin: 0.5 mg/dL (ref 0.0–1.2)
Total Protein: 6.8 g/dL (ref 6.5–8.1)

## 2024-02-09 LAB — CBC WITH DIFFERENTIAL/PLATELET
Abs Immature Granulocytes: 0.01 K/uL (ref 0.00–0.07)
Basophils Absolute: 0 K/uL (ref 0.0–0.1)
Basophils Relative: 0 %
Eosinophils Absolute: 0 K/uL (ref 0.0–0.5)
Eosinophils Relative: 0 %
HCT: 36.9 % (ref 36.0–46.0)
Hemoglobin: 11.3 g/dL — ABNORMAL LOW (ref 12.0–15.0)
Immature Granulocytes: 0 %
Lymphocytes Relative: 20 %
Lymphs Abs: 1.2 K/uL (ref 0.7–4.0)
MCH: 29 pg (ref 26.0–34.0)
MCHC: 30.6 g/dL (ref 30.0–36.0)
MCV: 94.6 fL (ref 80.0–100.0)
Monocytes Absolute: 0.5 K/uL (ref 0.1–1.0)
Monocytes Relative: 8 %
Neutro Abs: 4.5 K/uL (ref 1.7–7.7)
Neutrophils Relative %: 72 %
Platelets: 132 K/uL — ABNORMAL LOW (ref 150–400)
RBC: 3.9 MIL/uL (ref 3.87–5.11)
RDW: 19.9 % — ABNORMAL HIGH (ref 11.5–15.5)
WBC: 6.1 K/uL (ref 4.0–10.5)
nRBC: 0.3 % — ABNORMAL HIGH (ref 0.0–0.2)

## 2024-02-09 LAB — PROTIME-INR
INR: 1.1 (ref 0.8–1.2)
Prothrombin Time: 14.4 s (ref 11.4–15.2)

## 2024-02-12 ENCOUNTER — Encounter (HOSPITAL_COMMUNITY): Payer: Self-pay | Admitting: Internal Medicine

## 2024-02-12 ENCOUNTER — Ambulatory Visit (HOSPITAL_COMMUNITY): Admitting: Anesthesiology

## 2024-02-12 ENCOUNTER — Ambulatory Visit (HOSPITAL_COMMUNITY)
Admission: RE | Admit: 2024-02-12 | Discharge: 2024-02-12 | Disposition: A | Discharge status: Home / Self Care | Attending: Internal Medicine | Admitting: Internal Medicine

## 2024-02-12 ENCOUNTER — Encounter (HOSPITAL_COMMUNITY)
Admission: RE | Disposition: A | Discharge status: Home / Self Care | Payer: Self-pay | Source: Home / Self Care | Attending: Internal Medicine

## 2024-02-12 ENCOUNTER — Other Ambulatory Visit: Payer: Self-pay

## 2024-02-12 DIAGNOSIS — I10 Essential (primary) hypertension: Secondary | ICD-10-CM | POA: Diagnosis not present

## 2024-02-12 DIAGNOSIS — K219 Gastro-esophageal reflux disease without esophagitis: Secondary | ICD-10-CM | POA: Diagnosis not present

## 2024-02-12 DIAGNOSIS — K641 Second degree hemorrhoids: Secondary | ICD-10-CM

## 2024-02-12 DIAGNOSIS — K319 Disease of stomach and duodenum, unspecified: Secondary | ICD-10-CM

## 2024-02-12 DIAGNOSIS — D509 Iron deficiency anemia, unspecified: Secondary | ICD-10-CM

## 2024-02-12 DIAGNOSIS — K649 Unspecified hemorrhoids: Secondary | ICD-10-CM

## 2024-02-12 DIAGNOSIS — Z8711 Personal history of peptic ulcer disease: Secondary | ICD-10-CM | POA: Diagnosis not present

## 2024-02-12 DIAGNOSIS — K644 Residual hemorrhoidal skin tags: Secondary | ICD-10-CM

## 2024-02-12 DIAGNOSIS — E119 Type 2 diabetes mellitus without complications: Secondary | ICD-10-CM | POA: Diagnosis not present

## 2024-02-12 DIAGNOSIS — K3189 Other diseases of stomach and duodenum: Secondary | ICD-10-CM | POA: Diagnosis not present

## 2024-02-12 DIAGNOSIS — K222 Esophageal obstruction: Secondary | ICD-10-CM | POA: Diagnosis not present

## 2024-02-12 DIAGNOSIS — R1310 Dysphagia, unspecified: Secondary | ICD-10-CM

## 2024-02-12 DIAGNOSIS — E039 Hypothyroidism, unspecified: Secondary | ICD-10-CM | POA: Diagnosis not present

## 2024-02-12 DIAGNOSIS — K7460 Unspecified cirrhosis of liver: Secondary | ICD-10-CM | POA: Diagnosis not present

## 2024-02-12 DIAGNOSIS — K7581 Nonalcoholic steatohepatitis (NASH): Secondary | ICD-10-CM | POA: Diagnosis not present

## 2024-02-12 DIAGNOSIS — Z794 Long term (current) use of insulin: Secondary | ICD-10-CM | POA: Diagnosis not present

## 2024-02-12 DIAGNOSIS — Z7984 Long term (current) use of oral hypoglycemic drugs: Secondary | ICD-10-CM | POA: Insufficient documentation

## 2024-02-12 DIAGNOSIS — K449 Diaphragmatic hernia without obstruction or gangrene: Secondary | ICD-10-CM

## 2024-02-12 HISTORY — PX: ESOPHAGEAL DILATION: SHX303

## 2024-02-12 HISTORY — PX: COLONOSCOPY: SHX5424

## 2024-02-12 HISTORY — PX: ESOPHAGOGASTRODUODENOSCOPY: SHX5428

## 2024-02-12 LAB — GLUCOSE, CAPILLARY: Glucose-Capillary: 132 mg/dL — ABNORMAL HIGH (ref 70–99)

## 2024-02-12 SURGERY — COLONOSCOPY
Anesthesia: General

## 2024-02-12 MED ORDER — LACTATED RINGERS IV SOLN: INTRAVENOUS | Status: DC

## 2024-02-12 MED ORDER — LIDOCAINE 2% (20 MG/ML) 5 ML SYRINGE
INTRAMUSCULAR | Status: DC | PRN
Start: 2024-02-12 — End: 2024-02-12
  Administered 2024-02-12: 60 mg via INTRAVENOUS

## 2024-02-12 MED ORDER — PROPOFOL 10 MG/ML IV BOLUS
INTRAVENOUS | Status: DC | PRN
Start: 2024-02-12 — End: 2024-02-12
  Administered 2024-02-12: 70 mg via INTRAVENOUS
  Administered 2024-02-12: 125 ug/kg/min via INTRAVENOUS
  Administered 2024-02-12: 40 mg via INTRAVENOUS

## 2024-02-12 MED ORDER — GLYCOPYRROLATE PF 0.2 MG/ML IJ SOSY
PREFILLED_SYRINGE | INTRAMUSCULAR | Status: DC | PRN
  Administered 2024-02-12 (×2): .1 mg via INTRAVENOUS

## 2024-02-12 NOTE — Op Note (Signed)
 [facility] Patient Name: Kristine Rocha Procedure Date: 02/12/2024 12:55 PM MRN: [id number] Date of Birth: Mar 11, 1956 Attending MD: Kristine Ozell Hollingshead , MD, [id number] CSN: [id number] Age: 68 Admit Type: Outpatient Procedure:                Upper GI endoscopy Indications:              Dysphagia Providers:                Kristine Ozell Hollingshead, MD, Madelin Hunter, RN, Dorcas Lenis, Technician Referring MD:              Medicines:                Propofol  per Anesthesia Complications:            No immediate complications. Estimated Blood Loss:     Estimated blood loss was minimal. Procedure:                Pre-Anesthesia Assessment:                           - Prior to the procedure, a History and Physical                            was performed, and patient medications and                            allergies were reviewed. The patient's tolerance of                            previous anesthesia was also reviewed. The risks                            and benefits of the procedure and the sedation                            options and risks were discussed with the patient.                            All questions were answered, and informed consent                            was obtained. Prior Anticoagulants: The patient has                            taken no anticoagulant or antiplatelet agents. ASA                            Grade Assessment: III - A patient with severe                            systemic disease. After reviewing the risks and  benefits, the patient was deemed in satisfactory                            condition to undergo the procedure.                           After obtaining informed consent, the endoscope was                            passed under direct vision. Throughout the                            procedure, the patient's blood pressure, pulse, and                            oxygen saturations  were monitored continuously. The                            GIF-H190 ([id number]) scope was introduced through the                            mouth, and advanced to the second part of duodenum.                            The upper GI endoscopy was accomplished without                            difficulty. The patient tolerated the procedure                            well. Scope In: 1:59:13 PM Scope Out: 2:05:51 PM Total Procedure Duration: 0 hours 6 minutes 38 seconds  Findings:      A mild Schatzki ring was found at the gastroesophageal junction.      Gastric antral erosions and erythema. No ulcer or infiltrating process.       No varices. No evidence of portal gastropathy. Patent pylorus.      The duodenal bulb and second portion of the duodenum were normal. The       scope was withdrawn. Dilation was performed with a Maloney dilator with       mild resistance at 54 Fr. The dilation site was examined following       endoscope reinsertion and showed moderate improvement in luminal       narrowing. Estimated blood loss was minimal. Finally, the abnormal       gastric mucosa was biopsied for histologic study. Impression:               - Mild Schatzki ring. Dilated. Inflamed appearing                            gastric mucosa status post gastric biopsy.                           - Normal duodenal bulb and second portion of the  duodenum. Moderate Sedation:      Moderate (conscious) sedation was personally administered by an       anesthesia professional. The following parameters were monitored: oxygen       saturation, heart rate, blood pressure, respiratory rate, EKG, adequacy       of pulmonary ventilation, and response to care. Recommendation:           - Patient has a contact number available for                            emergencies. The signs and symptoms of potential                            delayed complications were discussed with the                             patient. Return to normal activities tomorrow.                            Written discharge instructions were provided to the                            patient.                           - Advance diet as tolerated. Follow-up on                            pathology. Office visit in 3 months. See                            colonoscopy report Procedure Code(s):        --- Professional ---                           [phone], Esophagogastroduodenoscopy, flexible,                            transoral; diagnostic, including collection of                            specimen(s) by brushing or washing, when performed                            (separate procedure)                           43450, Dilation of esophagus, by unguided sound or                            bougie, single or multiple passes Diagnosis Code(s):        --- Professional ---                           K22.2, Esophageal obstruction  R13.10, Dysphagia, unspecified CPT copyright 2022 American Medical Association. All rights reserved. The codes documented in this report are preliminary and upon coder review may  be revised to meet current compliance requirements. Kristine HERO. Vinal Rosengrant, MD Kristine Ozell Hollingshead, MD 02/12/2024 7:71:75 PM This report has been signed electronically. Number of Addenda: 0

## 2024-02-12 NOTE — Anesthesia Preprocedure Evaluation (Signed)
 Anesthesia Evaluation  Patient identified by MRN, date of birth, ID band Patient awake    Reviewed: Allergy & Precautions, H&P , NPO status , Patient's Chart, lab work & pertinent test results, reviewed documented beta blocker date and time   Airway Mallampati: II  TM Distance: >3 FB Neck ROM: full    Dental no notable dental hx. (+) Edentulous Upper, Edentulous Lower   Pulmonary neg pulmonary ROS, asthma    Pulmonary exam normal breath sounds clear to auscultation       Cardiovascular Exercise Tolerance: Good hypertension, negative cardio ROS  Rhythm:regular Rate:Normal     Neuro/Psych negative neurological ROS  negative psych ROS   GI/Hepatic negative GI ROS, Neg liver ROS, PUD,GERD  ,,  Endo/Other  negative endocrine ROSdiabetesHypothyroidism    Renal/GU negative Renal ROS  negative genitourinary   Musculoskeletal   Abdominal   Peds  Hematology negative hematology ROS (+) Blood dyscrasia, anemia   Anesthesia Other Findings   Reproductive/Obstetrics negative OB ROS                              Anesthesia Physical Anesthesia Plan  ASA: 3  Anesthesia Plan: General   Post-op Pain Management:    Induction:   PONV Risk Score and Plan: Propofol  infusion  Airway Management Planned:   Additional Equipment:   Intra-op Plan:   Post-operative Plan:   Informed Consent: I have reviewed the patients History and Physical, chart, labs and discussed the procedure including the risks, benefits and alternatives for the proposed anesthesia with the patient or authorized representative who has indicated his/her understanding and acceptance.     Dental Advisory Given  Plan Discussed with: CRNA  Anesthesia Plan Comments:         Anesthesia Quick Evaluation

## 2024-02-12 NOTE — Op Note (Signed)
 [facility] Patient Name: Kristine Rocha Procedure Date: 02/12/2024 12:54 PM MRN: [id number] Date of Birth: 08/02/1955 Attending MD: [name] , MD, [id number] CSN: [id number] Age: 68 Admit Type: Outpatient Procedure:                Colonoscopy Indications:              Iron deficiency anemia Providers:                [name], MD, Madelin Hunter, RN, Dorcas Lenis, Technician Referring MD:              Medicines:                Propofol  per Anesthesia Complications:            No immediate complications. Estimated Blood Loss:     Estimated blood loss: none. Estimated blood loss:                            none. Procedure:                Pre-Anesthesia Assessment:                           - Prior to the procedure, a History and Physical                            was performed, and patient medications and                            allergies were reviewed. The patient's tolerance of                            previous anesthesia was also reviewed. The risks                            and benefits of the procedure and the sedation                            options and risks were discussed with the patient.                            All questions were answered, and informed consent                            was obtained. Prior Anticoagulants: The patient has                            taken no anticoagulant or antiplatelet agents. ASA                            Grade Assessment: III - A patient with severe                            systemic  disease. After reviewing the risks and                            benefits, the patient was deemed in satisfactory                            condition to undergo the procedure.                           After obtaining informed consent, the colonoscope                            was passed under direct vision. Throughout the                            procedure, the patient's blood pressure,  pulse, and                            oxygen saturations were monitored continuously. The                            [id number]) scope was introduced through the                            anus and advanced to the 5 cm into the ileum. The                            colonoscopy was performed without difficulty. The                            patient tolerated the procedure well. The quality                            of the bowel preparation was adequate. The terminal                            ileum, ileocecal valve, appendiceal orifice, and                            rectum were photographed. The colonoscopy was                            performed without difficulty. The patient tolerated                            the procedure well. The ileocecal valve,                            appendiceal orifice, and rectum were photographed. Scope In: 2:13:40 PM Scope Out: 2:25:11 PM Scope Withdrawal Time: 0 hours 6 minutes 4 seconds  Total Procedure Duration: 0 hours 11 minutes 31 seconds  Findings:      Hemorrhoids were found on perianal exam.      The colon (entire examined portion) appeared normal.  Non-bleeding external and internal hemorrhoids were found. The       hemorrhoids were Grade II (internal hemorrhoids that prolapse but reduce       spontaneously).      The exam was otherwise without abnormality on direct and retroflexion       views. Impression:               - Hemorrhoids found on perianal exam.                           - The entire examined colon is normal.                           - Non-bleeding external and internal hemorrhoids.                           - The examination was otherwise normal on direct                            and retroflexion views.                           - No specimens collected. Moderate Sedation:      Moderate (conscious) sedation was personally administered by an       anesthesia professional. The following parameters were  monitored: oxygen       saturation, heart rate, blood pressure, respiratory rate, EKG, adequacy       of pulmonary ventilation, and response to care. Recommendation:           - Patient has a contact number available for                            emergencies. The signs and symptoms of potential                            delayed complications were discussed with the                            patient. Return to normal activities tomorrow.                            Written discharge instructions were provided to the                            patient.                           - Advance diet as tolerated.                           - Continue present medications.                           - No repeat colonoscopy due to age.                           - Return to GI clinic in 3 months.  See EGD report. Procedure Code(s):        --- Professional ---                           [phone], Colonoscopy, flexible; diagnostic, including                            collection of specimen(s) by brushing or washing,                            when performed (separate procedure) Diagnosis Code(s):        --- Professional ---                           K64.1, Second degree hemorrhoids                           D50.9, Iron deficiency anemia, unspecified CPT copyright 2022 American Medical Association. All rights reserved. The codes documented in this report are preliminary and upon coder review may  be revised to meet current compliance requirements. [name], MD [name], MD 02/12/2024 2:35:18 PM This report has been signed electronically. Number of Addenda: 0

## 2024-02-12 NOTE — Discharge Instructions (Signed)
 EGD Discharge instructions Please read the instructions outlined below and refer to this sheet in the next few weeks. These discharge instructions provide you with general information on caring for yourself after you leave the hospital. Your doctor may also give you specific instructions. While your treatment has been planned according to the most current medical practices available, unavoidable complications occasionally occur. If you have any problems or questions after discharge, please call your doctor. ACTIVITY You may resume your regular activity but move at a slower pace for the next 24 hours.  Take frequent rest periods for the next 24 hours.  Walking will help expel (get rid of) the air and reduce the bloated feeling in your abdomen.  No driving for 24 hours (because of the anesthesia (medicine) used during the test).  You may shower.  Do not sign any important legal documents or operate any machinery for 24 hours (because of the anesthesia used during the test).  NUTRITION Drink plenty of fluids.  You may resume your normal diet.  Begin with a light meal and progress to your normal diet.  Avoid alcoholic beverages for 24 hours or as instructed by your caregiver.  MEDICATIONS You may resume your normal medications unless your caregiver tells you otherwise.  WHAT YOU CAN EXPECT TODAY You may experience abdominal discomfort such as a feeling of fullness or "gas" pains.  FOLLOW-UP Your doctor will discuss the results of your test with you.  SEEK IMMEDIATE MEDICAL ATTENTION IF ANY OF THE FOLLOWING OCCUR: Excessive nausea (feeling sick to your stomach) and/or vomiting.  Severe abdominal pain and distention (swelling).  Trouble swallowing.  Temperature over 101 F (37.8 C).  Rectal bleeding or vomiting of blood.    Colonoscopy Discharge Instructions  Read the instructions outlined below and refer to this sheet in the next few weeks. These discharge instructions provide you with  general information on caring for yourself after you leave the hospital. Your doctor may also give you specific instructions. While your treatment has been planned according to the most current medical practices available, unavoidable complications occasionally occur. If you have any problems or questions after discharge, call Dr. Shaaron at [phone]. ACTIVITY You may resume your regular activity, but move at a slower pace for the next 24 hours.  Take frequent rest periods for the next 24 hours.  Walking will help get rid of the air and reduce the bloated feeling in your belly (abdomen).  No driving for 24 hours (because of the medicine (anesthesia) used during the test).   Do not sign any important legal documents or operate any machinery for 24 hours (because of the anesthesia used during the test).  NUTRITION Drink plenty of fluids.  You may resume your normal diet as instructed by your doctor.  Begin with a light meal and progress to your normal diet. Heavy or fried foods are harder to digest and may make you feel sick to your stomach (nauseated).  Avoid alcoholic beverages for 24 hours or as instructed.  MEDICATIONS You may resume your normal medications unless your doctor tells you otherwise.  WHAT YOU CAN EXPECT TODAY Some feelings of bloating in the abdomen.  Passage of more gas than usual.  Spotting of blood in your stool or on the toilet paper.  IF YOU HAD POLYPS REMOVED DURING THE COLONOSCOPY: No aspirin products for 7 days or as instructed.  No alcohol for 7 days or as instructed.  Eat a soft diet for the next 24 hours.  FINDING  OUT THE RESULTS OF YOUR TEST Not all test results are available during your visit. If your test results are not back during the visit, make an appointment with your caregiver to find out the results. Do not assume everything is normal if you have not heard from your caregiver or the medical facility. It is important for you to follow up on all of your test  results.  SEEK IMMEDIATE MEDICAL ATTENTION IF: You have more than a spotting of blood in your stool.  Your belly is swollen (abdominal distention).  You are nauseated or vomiting.  You have a temperature over 101.  You have abdominal pain or discomfort that is severe or gets worse throughout the day.     Your esophagus was stretched today.  Your stomach was mildly inflamed.  Biopsies taken   your colon was normal except for hemorrhoids.  A future colonoscopy is not recommended unless new symptoms develop  Further recommendations to follow pending review of pathology report  Office visit with us  in 3 months  At patient request I called [facility] at [phone] -   wrong number.

## 2024-02-12 NOTE — H&P (Signed)
 @LOGO @   Primary Care Physician:  Rosamond Leta NOVAK, MD Primary Gastroenterologist:  Dr. Shaaron  Pre-Procedure History & Physical: HPI:  Kristine Rocha is a 68 y.o. female here for further evaluation of iron deficiency anemia and a vague esophageal dysphagia via EGD and colonoscopy.  History of fatty liver/cirrhosis.  No varices on prior EGD.  Past Medical History:  Diagnosis Date   Asthma    Cirrhosis (HCC) 06/2019   Immune to Hep A. Has completed Hep B vaccine (May 2021)   Diabetes [facility])    Essential hypertension    GERD (gastroesophageal reflux disease)    History of GI bleed    Hypothyroidism    Macular degeneration    wet in right, dry in left, has injections for this.   Microcytic anemia    Palpitations    Peptic ulcer disease    Postural orthostatic tachycardia syndrome    Mildly orthostatic results tilt table test 2005  - Dr. 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    COLONOSCOPY WITH PROPOFOL  N/A 09/27/2021   Procedure: COLONOSCOPY WITH PROPOFOL ;  Surgeon: Cindie Carlin POUR, DO;  Location: [facility];  Service: Endoscopy;  Laterality: N/A;  11:15am   ESOPHAGOGASTRODUODENOSCOPY (EGD) WITH PROPOFOL  N/A 11/04/2019   Procedure: ESOPHAGOGASTRODUODENOSCOPY (EGD)  WITH PROPOFOL ;  Surgeon: Shaaron Lamar HERO, MD; normal esophagus s/p empiric dilation, abnormal appearing gastric mucosa s/p biopsy, abnormal duodenal mucosa s/p biopsied, no ulcer, infiltrating process, or portal gastropathy.  Duodenal biopsies benign.  Gastric biopsy with reactive gastropathy with erosions, no H. pylori.    ESOPHAGOGASTRODUODENOSCOPY (EGD) WITH PROPOFOL  N/A 09/27/2021   Procedure: ESOPHAGOGASTRODUODENOSCOPY (EGD) WITH PROPOFOL ;  Surgeon: Cindie Carlin POUR, DO;  Location: [facility];  Service: Endoscopy;  Laterality: N/A;   KNEE ARTHROSCOPY Right    MALONEY DILATION N/A 11/04/2019   Procedure: AGAPITO DILATION;  Surgeon: Shaaron Lamar HERO, MD;  Location: [facility];  Service: Endoscopy;  Laterality: N/A;   TILT TABLE STUDY  2005   [facility]   TRIGGER FINGER RELEASE Right    2nd finger    Prior to Admission medications   Medication Sig Start Date End Date Taking? Authorizing Provider  atorvastatin (LIPITOR) 10 MG tablet Take 10 mg by mouth every evening.    Yes [provider]  Ferrous Sulfate (IRON) 325 (65 Fe) MG TABS Take 325 mg by mouth daily. With Vit C   Yes [provider]  JARDIANCE 25 MG TABS tablet Take 25 mg by mouth daily. 09/14/21  Yes [provider]  levothyroxine (SYNTHROID) 75 MCG tablet Take 75 mcg by mouth daily. 11/16/23  Yes [provider]  meclizine (ANTIVERT) 12.5 MG tablet Take 12.5 mg by mouth 3 (three) times  daily as needed. 11/03/23  Yes [provider]  Melatonin 10 MG TABS Take 10 mg by mouth at bedtime.    Yes [provider]  metFORMIN (GLUCOPHAGE-XR) 500 MG 24 hr tablet Take 500 mg by mouth 2 (two) times daily. 03/04/17  Yes [provider]  metoprolol succinate (TOPROL-XL) 100 MG 24 hr tablet Take 100 mg by mouth daily. *Hold for low blood pressure*   Yes [provider]  omeprazole  (PRILOSEC) 40 MG capsule TAKE 1 CAPSULE BY MOUTH TWICE  DAILY BEFORE MEALS 01/24/24  Yes Fabion Gatson,  Lamar HERO, MD  tiZANidine (ZANAFLEX) 4 MG tablet Take 4 mg by mouth 3 (three) times daily as needed for muscle spasms.  06/14/19  Yes [provider]  valsartan (DIOVAN) 40 MG tablet Take 40 mg by mouth daily. 09/04/21  Yes [provider]  hyoscyamine (LEVSIN SL) 0.125 MG SL tablet Place 0.125 mg under the tongue 3 (three) times daily as needed for cramping. 01/13/20   [provider]  insulin lispro (HUMALOG) 100 UNIT/ML KwikPen Inject 3-5 Units into the skin in the morning and at bedtime. Per sliding scale    [provider]  ketoconazole (NIZORAL) 2 % cream Apply 1 application topically daily. Groin area    [provider]  Multiple Vitamins-Minerals (PRESERVISION AREDS 2 PO) Take 1 tablet by mouth 2 (two) times daily.    [provider]  [facility] VERIO test strip  12/06/16   [provider]  OVER THE COUNTER MEDICATION Take 1 capsule by mouth daily. Fruits and Vegetables    [provider]  TRESIBA FLEXTOUCH 200 UNIT/ML FlexTouch Pen Inject 15 Units into the skin daily. 09/14/21   [provider]    Allergies as of 12/07/2023 - Review Complete 11/21/2023  Allergen Reaction Noted   Amoxicillin Hives, Shortness Of Breath, and Rash 11/19/2014   Ampicillin Hives, Shortness Of Breath, and Rash 11/19/2014   Methotrexate derivatives Other (See Comments) 02/01/2021   Apremilast  11/21/2023   Codeine  11/21/2023   Dust mite extract  10/28/2020   Molds & smuts  10/28/2020   Nsaids  09/17/2021   Other  11/19/2014   Prevacid [lansoprazole] Diarrhea 06/24/2019    Family History  Problem Relation Age of Onset   Heart attack Father        Age 72   Arrhythmia Sister        WPW s/p ablation- corrected [facility]   Alzheimer's disease Mother    Pneumonia Mother        Aspiration   Heart attack Brother    Heart attack Maternal Grandfather    Colon cancer Maternal Aunt        diagnosed at 89   Breast cancer Maternal  Grandmother     Social History   Socioeconomic History   Marital status: Married    Spouse name: Not on file   Number of children: Not on file   Years of education: Not on file   Highest education level: Not on file  Occupational History   Not on file  Tobacco Use   Smoking status: Never   Smokeless tobacco: Never  Vaping Use   Vaping status: Never Used  Substance and Sexual Activity   Alcohol use: No    Alcohol/week: 0.0 standard drinks of alcohol   Drug use: No   Sexual activity: Yes  Other Topics Concern   Not on file  Social History Narrative   Not on file  Social Drivers of Corporate investment banker Strain: Low Risk  (03/24/2023)   Overall Financial Resource Strain (CARDIA)    Difficulty of Paying Living Expenses: Not very hard  Food Insecurity: No Food Insecurity (03/24/2023)   Hunger Vital Sign    Worried About Running Out of Food in the Last Year: Never true    Ran Out of Food in the Last Year: Never true  Transportation Needs: No Transportation Needs (03/24/2023)   PRAPARE - Administrator, Civil Service (Medical): No    Lack of Transportation (Non-Medical): No  Physical Activity: Inactive (03/24/2023)   Exercise Vital Sign    Days of Exercise per Week: 0 days    Minutes of Exercise per Session: 0 min  Stress: No Stress Concern Present (03/24/2023)   [employer] of Occupational Health - Occupational Stress Questionnaire    Feeling of Stress : Not at all  Social Connections: Moderately Isolated (03/24/2023)   Social Connection and Isolation Panel    Frequency of Communication with Friends and Family: More than three times a week    Frequency of Social Gatherings with Friends and Family: More than three times a week    Attends Religious Services: Never    Database administrator or Organizations: No    Attends Engineer, structural: Never    Marital Status: Married  Catering manager Violence: Not on file    Review of Systems: See  HPI, otherwise negative ROS  Physical Exam: BP (P) 130/60   Pulse (P) 72   Temp (P) 98 F (36.7 C) (Oral)   Resp (P) 18   SpO2 (P) 100%  General:   Alert,  Well-developed, well-nourished, pleasant and cooperative in NAD Neck:  Supple; no masses or thyromegaly. No significant cervical adenopathy. Lungs:  Clear throughout to auscultation.   No wheezes, crackles, or rhonchi. No acute distress. Heart:  Regular rate and rhythm; no murmurs, clicks, rubs,  or gallops. Abdomen: Non-distended, normal bowel sounds.  Soft and nontender without appreciable mass or hepatosplenomegaly.   Impression/Plan: 68 year old lady with Hollie cirrhosis here for further evaluation of dysphagia and iron deficiency anemia via EGD and colonoscopy. The risks, benefits, limitations, imponderables and alternatives regarding both EGD and colonoscopy have been reviewed with the patient. Questions have been answered. All parties agreeable.       Notice: This dictation was prepared with Dragon dictation along with smaller phrase technology. Any transcriptional errors that result from this process are unintentional and may not be corrected upon review.

## 2024-02-12 NOTE — Transfer of Care (Signed)
 Immediate Anesthesia Transfer of Care Note  Patient: Kristine Rocha  Procedure(s) Performed: COLONOSCOPY EGD (ESOPHAGOGASTRODUODENOSCOPY) DILATION, ESOPHAGUS  Patient Location: Short Stay  Anesthesia Type:General  Level of Consciousness: drowsy and patient cooperative  Airway & Oxygen Therapy: Patient Spontanous Breathing  Post-op Assessment: Report given to RN and Post -op Vital signs reviewed and stable  Post vital signs: Reviewed and stable  Last Vitals:  Vitals Value Taken Time  BP 134/65 02/12/24 14:33  Temp 36.7 C 02/12/24 14:31  Pulse 92 02/12/24 14:31  Resp 19 02/12/24 14:31  SpO2 99 % 02/12/24 14:31    Last Pain:  Vitals:   02/12/24 1431  TempSrc: Oral  PainSc: 0-No pain      Patients Stated Pain Goal: (P) 7 (02/12/24 1213)  Complications: No notable events documented.

## 2024-02-13 ENCOUNTER — Encounter (HOSPITAL_COMMUNITY): Payer: Self-pay | Admitting: Internal Medicine

## 2024-02-13 DIAGNOSIS — H35372 Puckering of macula, left eye: Secondary | ICD-10-CM | POA: Diagnosis not present

## 2024-02-13 DIAGNOSIS — H353114 Nonexudative age-related macular degeneration, right eye, advanced atrophic with subfoveal involvement: Secondary | ICD-10-CM | POA: Diagnosis not present

## 2024-02-13 DIAGNOSIS — H353212 Exudative age-related macular degeneration, right eye, with inactive choroidal neovascularization: Secondary | ICD-10-CM | POA: Diagnosis not present

## 2024-02-13 DIAGNOSIS — E113293 Type 2 diabetes mellitus with mild nonproliferative diabetic retinopathy without macular edema, bilateral: Secondary | ICD-10-CM | POA: Diagnosis not present

## 2024-02-13 DIAGNOSIS — H353122 Nonexudative age-related macular degeneration, left eye, intermediate dry stage: Secondary | ICD-10-CM | POA: Diagnosis not present

## 2024-02-14 LAB — SURGICAL PATHOLOGY

## 2024-02-17 NOTE — Anesthesia Postprocedure Evaluation (Signed)
 Anesthesia Post Note  Patient: Kristine Rocha  Procedure(s) Performed: COLONOSCOPY EGD (ESOPHAGOGASTRODUODENOSCOPY) DILATION, ESOPHAGUS  Patient location during evaluation: Phase II Anesthesia Type: General Level of consciousness: awake Pain management: pain level controlled Vital Signs Assessment: post-procedure vital signs reviewed and stable Respiratory status: spontaneous breathing and respiratory function stable Cardiovascular status: blood pressure returned to baseline and stable Postop Assessment: no headache and no apparent nausea or vomiting Anesthetic complications: no Comments: Late entry   No notable events documented.   Last Vitals:  Vitals:   02/12/24 1431 02/12/24 1433  BP:  134/65  Pulse: 92   Resp: 19   Temp: 36.7 C   SpO2: 99%     Last Pain:  Vitals:   02/12/24 1431  TempSrc: Oral  PainSc: 0-No pain                 Yvonna JINNY Bosworth

## 2024-02-25 ENCOUNTER — Ambulatory Visit: Payer: Self-pay | Admitting: Internal Medicine

## 2024-02-26 DIAGNOSIS — K7460 Unspecified cirrhosis of liver: Secondary | ICD-10-CM | POA: Diagnosis not present

## 2024-02-26 DIAGNOSIS — J069 Acute upper respiratory infection, unspecified: Secondary | ICD-10-CM | POA: Diagnosis not present

## 2024-02-26 DIAGNOSIS — Z299 Encounter for prophylactic measures, unspecified: Secondary | ICD-10-CM | POA: Diagnosis not present

## 2024-02-26 DIAGNOSIS — R110 Nausea: Secondary | ICD-10-CM | POA: Diagnosis not present

## 2024-03-20 DIAGNOSIS — H353212 Exudative age-related macular degeneration, right eye, with inactive choroidal neovascularization: Secondary | ICD-10-CM | POA: Diagnosis not present

## 2024-03-20 DIAGNOSIS — E113293 Type 2 diabetes mellitus with mild nonproliferative diabetic retinopathy without macular edema, bilateral: Secondary | ICD-10-CM | POA: Diagnosis not present

## 2024-03-20 DIAGNOSIS — H35372 Puckering of macula, left eye: Secondary | ICD-10-CM | POA: Diagnosis not present

## 2024-03-20 DIAGNOSIS — H353122 Nonexudative age-related macular degeneration, left eye, intermediate dry stage: Secondary | ICD-10-CM | POA: Diagnosis not present

## 2024-03-20 DIAGNOSIS — H353114 Nonexudative age-related macular degeneration, right eye, advanced atrophic with subfoveal involvement: Secondary | ICD-10-CM | POA: Diagnosis not present

## 2024-03-27 DIAGNOSIS — E119 Type 2 diabetes mellitus without complications: Secondary | ICD-10-CM | POA: Diagnosis not present

## 2024-04-08 DIAGNOSIS — H353122 Nonexudative age-related macular degeneration, left eye, intermediate dry stage: Secondary | ICD-10-CM | POA: Diagnosis not present

## 2024-04-12 DIAGNOSIS — Z23 Encounter for immunization: Secondary | ICD-10-CM | POA: Diagnosis not present

## 2024-04-15 DIAGNOSIS — M170 Bilateral primary osteoarthritis of knee: Secondary | ICD-10-CM | POA: Diagnosis not present

## 2024-04-15 DIAGNOSIS — M7741 Metatarsalgia, right foot: Secondary | ICD-10-CM | POA: Diagnosis not present

## 2024-04-17 DIAGNOSIS — H353211 Exudative age-related macular degeneration, right eye, with active choroidal neovascularization: Secondary | ICD-10-CM | POA: Diagnosis not present

## 2024-04-17 DIAGNOSIS — R058 Other specified cough: Secondary | ICD-10-CM | POA: Diagnosis not present

## 2024-04-17 DIAGNOSIS — I10 Essential (primary) hypertension: Secondary | ICD-10-CM | POA: Diagnosis not present

## 2024-04-17 DIAGNOSIS — E119 Type 2 diabetes mellitus without complications: Secondary | ICD-10-CM | POA: Diagnosis not present

## 2024-04-17 DIAGNOSIS — Z299 Encounter for prophylactic measures, unspecified: Secondary | ICD-10-CM | POA: Diagnosis not present

## 2024-04-17 DIAGNOSIS — L4050 Arthropathic psoriasis, unspecified: Secondary | ICD-10-CM | POA: Diagnosis not present

## 2024-04-24 DIAGNOSIS — H35372 Puckering of macula, left eye: Secondary | ICD-10-CM | POA: Diagnosis not present

## 2024-04-24 DIAGNOSIS — E113293 Type 2 diabetes mellitus with mild nonproliferative diabetic retinopathy without macular edema, bilateral: Secondary | ICD-10-CM | POA: Diagnosis not present

## 2024-04-24 DIAGNOSIS — H353212 Exudative age-related macular degeneration, right eye, with inactive choroidal neovascularization: Secondary | ICD-10-CM | POA: Diagnosis not present

## 2024-04-24 DIAGNOSIS — H353122 Nonexudative age-related macular degeneration, left eye, intermediate dry stage: Secondary | ICD-10-CM | POA: Diagnosis not present

## 2024-04-24 DIAGNOSIS — H353114 Nonexudative age-related macular degeneration, right eye, advanced atrophic with subfoveal involvement: Secondary | ICD-10-CM | POA: Diagnosis not present

## 2024-04-25 ENCOUNTER — Encounter: Payer: Self-pay | Admitting: Internal Medicine

## 2024-05-07 DIAGNOSIS — M1991 Primary osteoarthritis, unspecified site: Secondary | ICD-10-CM | POA: Diagnosis not present

## 2024-05-07 DIAGNOSIS — Z79899 Other long term (current) drug therapy: Secondary | ICD-10-CM | POA: Diagnosis not present

## 2024-05-07 DIAGNOSIS — L409 Psoriasis, unspecified: Secondary | ICD-10-CM | POA: Diagnosis not present

## 2024-05-07 DIAGNOSIS — M1009 Idiopathic gout, multiple sites: Secondary | ICD-10-CM | POA: Diagnosis not present

## 2024-05-08 ENCOUNTER — Inpatient Hospital Stay: Admission: RE | Admit: 2024-05-08 | Source: Ambulatory Visit

## 2024-05-13 ENCOUNTER — Encounter: Payer: Self-pay | Admitting: *Deleted

## 2024-05-13 ENCOUNTER — Other Ambulatory Visit: Payer: Self-pay | Admitting: *Deleted

## 2024-05-13 ENCOUNTER — Ambulatory Visit: Admitting: Internal Medicine

## 2024-05-13 ENCOUNTER — Encounter: Payer: Self-pay | Admitting: Internal Medicine

## 2024-05-13 VITALS — BP 144/86 | HR 60 | Temp 98.7°F | Ht 60.5 in | Wt 184.8 lb

## 2024-05-13 DIAGNOSIS — K760 Fatty (change of) liver, not elsewhere classified: Secondary | ICD-10-CM

## 2024-05-13 DIAGNOSIS — R772 Abnormality of alphafetoprotein: Secondary | ICD-10-CM

## 2024-05-13 DIAGNOSIS — Z8719 Personal history of other diseases of the digestive system: Secondary | ICD-10-CM

## 2024-05-13 DIAGNOSIS — K7460 Unspecified cirrhosis of liver: Secondary | ICD-10-CM

## 2024-05-13 DIAGNOSIS — R7989 Other specified abnormal findings of blood chemistry: Secondary | ICD-10-CM

## 2024-05-13 NOTE — Patient Instructions (Addendum)
 It was good to see you again today  Your liver is doing well  Continue good control blood sugars  Watch the salt in your diet 2 g sodium limit per day  1 to 2 cups of black coffee daily is good for your liver  Liver labs in 3 months c-Met, CBC, INR and AFP  Liver ultrasound every 6 months  No more colonoscopies.  Continue omeprazole  twice daily  Timing of next upper endoscopy to be determined  Plan to see you back in 1 year and as needed

## 2024-05-13 NOTE — Progress Notes (Signed)
 Gastroenterology Progress Note    Primary Care Physician:  Rosamond Leta NOVAK, MD Primary Gastroenterologist:  Dr. Shaaron  Pre-Procedure History & Physical: HPI:  Kristine Rocha is a 68 y.o. female here for for MASLD/cirrhosis.  Remains well compensated.  No varices on recent EGD.  Dilated Schatzki's ring.  Dysphagia resolved.  Colonoscopy negative.  No future exam recommended.  Requires twice daily omeprazole  for good control of reflux.  Platelets hovering in the 130,000 range.  She tells me hemoglobin 12 through [facility] rheumatology recently.  She does not drink alcohol diabetic hemoglobin A1c's in the 6 range.  She tries to get some regular exercise.  Drinks 1 to 2 cup cups of black coffee daily.  She is on schedule for hepatic ultrasounds every 6 months.  Past Medical History:  Diagnosis Date   Asthma    Cirrhosis (HCC) 06/2019   Immune to Hep A. Has completed Hep B vaccine (May 2021)   Diabetes [facility])    Essential hypertension    GERD (gastroesophageal reflux disease)    History of GI bleed    Hypothyroidism    Macular degeneration    wet in right, dry in left, has injections for this.   Microcytic anemia    Palpitations    Peptic ulcer disease    Postural orthostatic tachycardia syndrome    Mildly orthostatic results tilt table test 2005  - Dr. 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    Past Surgical History:  Procedure Laterality Date   ABDOMINAL HERNIA REPAIR  15 years ago   umbilical with mesh   ABDOMINAL HYSTERECTOMY  20 years ago    BIOPSY  11/04/2019   Procedure: BIOPSY;  Surgeon: Shaaron Lamar HERO, MD;  [facility];  Service: Endoscopy;;  duodenum gastric   BIOPSY  09/27/2021   Procedure: BIOPSY;  Surgeon: Cindie Carlin POUR, DO;  [facility];  Service: Endoscopy;;   CATARACT EXTRACTION, BILATERAL Bilateral     CHOLECYSTECTOMY     COLONOSCOPY N/A 02/12/2024   Procedure: COLONOSCOPY;  Surgeon: Shaaron Lamar HERO, MD;  [facility];  Service: Endoscopy;  Laterality: N/A;  945am ASA 3   COLONOSCOPY WITH ESOPHAGOGASTRODUODENOSCOPY (EGD)  03/2018   [facility]; indication:IDA; Normal exam.    COLONOSCOPY WITH PROPOFOL  N/A 09/27/2021   Procedure: COLONOSCOPY WITH PROPOFOL ;  Surgeon: Cindie Carlin POUR, DO;  [facility];  Service: Endoscopy;  Laterality: N/A;  11:15am   ESOPHAGEAL DILATION N/A 02/12/2024   Procedure: DILATION, ESOPHAGUS;  Surgeon: Shaaron Lamar HERO, MD;  [facility];  Service: Endoscopy;  Laterality: N/A;   ESOPHAGOGASTRODUODENOSCOPY N/A 02/12/2024   Procedure: EGD (ESOPHAGOGASTRODUODENOSCOPY);  Surgeon: Shaaron Lamar HERO, MD;  [facility];  Service: Endoscopy;  Laterality: N/A;   ESOPHAGOGASTRODUODENOSCOPY (EGD) WITH PROPOFOL  N/A 11/04/2019   Procedure: ESOPHAGOGASTRODUODENOSCOPY (EGD) WITH PROPOFOL ;  Surgeon: Shaaron Lamar HERO, MD; normal esophagus s/p empiric dilation, abnormal appearing gastric mucosa s/p biopsy, abnormal duodenal mucosa s/p biopsied, no ulcer, infiltrating process, or portal gastropathy.  Duodenal biopsies benign.  Gastric biopsy with reactive gastropathy with erosions, no H. pylori.    ESOPHAGOGASTRODUODENOSCOPY (EGD) WITH PROPOFOL  N/A 09/27/2021   Procedure: ESOPHAGOGASTRODUODENOSCOPY (EGD) WITH PROPOFOL ;  Surgeon: Cindie Carlin POUR, DO;  [facility];  Service: Endoscopy;  Laterality: N/A;   KNEE ARTHROSCOPY Right  MALONEY DILATION N/A 11/04/2019   Procedure: AGAPITO DILATION;  Surgeon: Shaaron Lamar HERO, MD;  [facility];  Service: Endoscopy;  Laterality: N/A;   TILT TABLE STUDY  2005   [facility]   TRIGGER FINGER RELEASE Right    2nd finger    Prior to Admission medications   Medication Sig Start Date End Date Taking? Authorizing Provider  atorvastatin (LIPITOR) 10 MG tablet Take 10 mg by mouth every  evening.    Yes [provider]  Blood Glucose Monitoring Suppl (CONTOUR PLUS BLUE) w/Device KIT  05/07/24  Yes [provider]  Ferrous Sulfate (IRON) 325 (65 Fe) MG TABS Take 325 mg by mouth daily. With Vit C   Yes [provider]  HUMIRA, 2 PEN, 40 MG/0.4ML pen Inject 40 mg into the skin every 14 (fourteen) days. 04/03/24  Yes [provider]  hyoscyamine (LEVSIN SL) 0.125 MG SL tablet Place 0.125 mg under the tongue 3 (three) times daily as needed for cramping. 01/13/20  Yes [provider]  insulin lispro (HUMALOG) 100 UNIT/ML KwikPen Inject 3-5 Units into the skin in the morning and at bedtime. Per sliding scale   Yes [provider]  JARDIANCE 25 MG TABS tablet Take 25 mg by mouth daily. 09/14/21  Yes [provider]  ketoconazole (NIZORAL) 2 % cream Apply 1 application topically daily. Groin area   Yes [provider]  levothyroxine (SYNTHROID) 75 MCG tablet Take 75 mcg by mouth daily. 11/16/23  Yes [provider]  Melatonin 10 MG TABS Take 10 mg by mouth at bedtime.    Yes [provider]  metFORMIN (GLUCOPHAGE-XR) 500 MG 24 hr tablet Take 500 mg by mouth 2 (two) times daily. 03/04/17  Yes [provider]  metoprolol succinate (TOPROL-XL) 100 MG 24 hr tablet Take 100 mg by mouth daily. *Hold for low blood pressure*   Yes [provider]  Microlet Lancets MISC  05/07/24  Yes [provider]  Multiple Vitamins-Minerals (PRESERVISION AREDS 2 PO) Take 1 tablet by mouth 2 (two) times daily.   Yes [provider]  omeprazole  (PRILOSEC) 40 MG capsule TAKE 1 CAPSULE BY MOUTH TWICE  DAILY BEFORE MEALS 01/24/24  Yes Trea Carnegie, Lamar HERO, MD  [facility] VERIO test strip  12/06/16  Yes [provider]  OVER THE COUNTER MEDICATION Take 1 capsule by mouth daily. Fruits and Vegetables   Yes [provider]  tiZANidine (ZANAFLEX) 4 MG tablet Take 4 mg by mouth 3 (three) times  daily as needed for muscle spasms.  06/14/19  Yes [provider]  TRESIBA FLEXTOUCH 200 UNIT/ML FlexTouch Pen Inject 15 Units into the skin daily. 09/14/21  Yes [provider]  valsartan (DIOVAN) 40 MG tablet Take 40 mg by mouth daily. 09/04/21  Yes [provider]    Allergies as of 05/13/2024 - Review Complete 05/13/2024  Allergen Reaction Noted   Amoxicillin Hives, Shortness Of Breath, and Rash 11/19/2014   Ampicillin Hives, Shortness Of Breath, and Rash 11/19/2014   Methotrexate and trimetrexate Other (See Comments) 02/01/2021   Apremilast  11/21/2023   Codeine  11/21/2023   Dust mite extract  10/28/2020   Molds & smuts  10/28/2020   Nsaids  09/17/2021   Other  11/19/2014   Prevacid [lansoprazole] Diarrhea 06/24/2019    Family History  Problem Relation Age of Onset   Heart attack Father        Age 83   Arrhythmia Sister  WPW s/p ablation- corrected at [facility]   Alzheimer's disease Mother    Pneumonia Mother        Aspiration   Heart attack Brother    Heart attack Maternal Grandfather    Colon cancer Maternal Aunt        diagnosed at 82   Breast cancer Maternal Grandmother     Social History   Socioeconomic History   Marital status: Married    Spouse name: Not on file   Number of children: Not on file   Years of education: Not on file   Highest education level: Not on file  Occupational History   Not on file  Tobacco Use   Smoking status: Never   Smokeless tobacco: Never  Vaping Use   Vaping status: Never Used  Substance and Sexual Activity   Alcohol use: No    Alcohol/week: 0.0 standard drinks of alcohol   Drug use: No   Sexual activity: Yes  Other Topics Concern   Not on file  Social History Narrative   Not on file   Social Drivers of Health   Financial Resource Strain: Low Risk  (03/24/2023)   Overall Financial Resource Strain (CARDIA)    Difficulty of Paying Living Expenses: Not very hard  Food Insecurity: No Food  Insecurity (03/24/2023)   Hunger Vital Sign    Worried About Running Out of Food in the Last Year: Never true    Ran Out of Food in the Last Year: Never true  Transportation Needs: No Transportation Needs (03/24/2023)   PRAPARE - Administrator, Civil Service (Medical): No    Lack of Transportation (Non-Medical): No  Physical Activity: Inactive (03/24/2023)   Exercise Vital Sign    Days of Exercise per Week: 0 days    Minutes of Exercise per Session: 0 min  Stress: No Stress Concern Present (03/24/2023)   [employer] of Occupational Health - Occupational Stress Questionnaire    Feeling of Stress : Not at all  Social Connections: Moderately Isolated (03/24/2023)   Social Connection and Isolation Panel    Frequency of Communication with Friends and Family: More than three times a week    Frequency of Social Gatherings with Friends and Family: More than three times a week    Attends Religious Services: Never    Database administrator or Organizations: No    Attends Engineer, structural: Never    Marital Status: Married  Catering manager Violence: Not on file    Review of Systems   See HPI, otherwise negative ROS  Physical Exam: BP (!) 144/86 (BP Location: Left Arm, Patient Position: Sitting, Cuff Size: Normal)   Pulse 60   Temp 98.7 F (37.1 C) (Oral)   Ht 5' 0.5 (1.537 m)   Wt 184 lb 12.8 oz (83.8 kg)   SpO2 98%   BMI 35.50 kg/m  General:   Alert,  Well-developed, well-nourished, pleasant and cooperative in NAD Neck:  Supple; no masses or thyromegaly. No significant cervical adenopathy. Lungs:  Clear throughout to auscultation.   No wheezes, crackles, or rhonchi. No acute distress. Heart:  Regular rate and rhythm; no murmurs, clicks, rubs,  or gallops. Abdomen: Non-distended, normal bowel sounds.  Soft and nontender without appreciable mass or hepatosplenomegaly.    Impression/Plan:    68 year old lady with MASLD/cirrhosis.  Remains well  compensated.  No varices on EGD earlier this year.  Platelets running in the 130 range.  Recommendations  good control blood sugars  Watch the salt in your diet 2 g sodium limit per day  1 to 2 cups of black coffee daily is good for your liver  Liver labs in 3 months c-Met, CBC, INR and AFP  Liver ultrasound every 6 months  No more colonoscopies.  Timing of next upper endoscopy to be determined  Plan to see you back in 1 year and as needed      Notice: This dictation was prepared with Dragon dictation along with smaller phrase technology. Any transcriptional errors that result from this process are unintentional and may not be corrected upon review.

## 2024-05-14 DIAGNOSIS — H353211 Exudative age-related macular degeneration, right eye, with active choroidal neovascularization: Secondary | ICD-10-CM | POA: Diagnosis not present

## 2024-05-14 DIAGNOSIS — H353122 Nonexudative age-related macular degeneration, left eye, intermediate dry stage: Secondary | ICD-10-CM | POA: Diagnosis not present

## 2024-05-14 DIAGNOSIS — H35372 Puckering of macula, left eye: Secondary | ICD-10-CM | POA: Diagnosis not present

## 2024-05-14 DIAGNOSIS — Z961 Presence of intraocular lens: Secondary | ICD-10-CM | POA: Diagnosis not present

## 2024-05-14 DIAGNOSIS — E119 Type 2 diabetes mellitus without complications: Secondary | ICD-10-CM | POA: Diagnosis not present

## 2024-05-15 DIAGNOSIS — M810 Age-related osteoporosis without current pathological fracture: Secondary | ICD-10-CM | POA: Diagnosis not present

## 2024-05-15 DIAGNOSIS — E1165 Type 2 diabetes mellitus with hyperglycemia: Secondary | ICD-10-CM | POA: Diagnosis not present

## 2024-05-15 DIAGNOSIS — E7800 Pure hypercholesterolemia, unspecified: Secondary | ICD-10-CM | POA: Diagnosis not present

## 2024-05-15 DIAGNOSIS — E039 Hypothyroidism, unspecified: Secondary | ICD-10-CM | POA: Diagnosis not present

## 2024-05-17 ENCOUNTER — Ambulatory Visit: Admitting: Internal Medicine

## 2024-05-27 ENCOUNTER — Other Ambulatory Visit: Payer: Self-pay | Admitting: Internal Medicine

## 2024-05-27 DIAGNOSIS — Z1231 Encounter for screening mammogram for malignant neoplasm of breast: Secondary | ICD-10-CM

## 2024-05-28 ENCOUNTER — Ambulatory Visit
Admission: RE | Admit: 2024-05-28 | Discharge: 2024-05-28 | Disposition: A | Discharge status: Home / Self Care | Source: Ambulatory Visit | Attending: Internal Medicine | Admitting: Internal Medicine

## 2024-05-28 DIAGNOSIS — Z1231 Encounter for screening mammogram for malignant neoplasm of breast: Secondary | ICD-10-CM

## 2024-05-29 ENCOUNTER — Other Ambulatory Visit: Payer: Self-pay | Admitting: Internal Medicine

## 2024-05-29 DIAGNOSIS — R921 Mammographic calcification found on diagnostic imaging of breast: Secondary | ICD-10-CM

## 2024-06-05 ENCOUNTER — Ambulatory Visit: Payer: Self-pay | Admitting: Internal Medicine

## 2024-06-05 ENCOUNTER — Ambulatory Visit (HOSPITAL_COMMUNITY)
Admission: RE | Admit: 2024-06-05 | Discharge: 2024-06-05 | Disposition: A | Discharge status: Home / Self Care | Source: Ambulatory Visit | Attending: Internal Medicine | Admitting: Internal Medicine

## 2024-06-05 DIAGNOSIS — Z8719 Personal history of other diseases of the digestive system: Secondary | ICD-10-CM | POA: Diagnosis present

## 2024-06-07 NOTE — Progress Notes (Signed)
 noted

## 2024-06-24 ENCOUNTER — Encounter: Payer: Self-pay | Admitting: *Deleted

## 2024-06-24 NOTE — Progress Notes (Signed)
 OLIANA GOWENS                                          MRN: [id number]   06/24/2024   The [facility] Quality Team Specialist reviewed this patient medical record for the purposes of chart review for care gap closure. The following were reviewed: chart review for care gap closure-breast cancer screening.    [facility] Quality Team
